# Patient Record
Sex: Male | Born: 1960 | Race: White | Hispanic: No | Marital: Married | Smoking: Never smoker
Health system: Southern US, Community
[De-identification: ages and names within clinical notes are randomized; demographics above are authoritative.]

## PROBLEM LIST (undated history)

## (undated) DIAGNOSIS — G4733 Obstructive sleep apnea (adult) (pediatric): Secondary | ICD-10-CM

## (undated) DIAGNOSIS — I10 Essential (primary) hypertension: Secondary | ICD-10-CM

## (undated) DIAGNOSIS — I4510 Unspecified right bundle-branch block: Secondary | ICD-10-CM

## (undated) HISTORY — PX: TONSILLECTOMY: SHX5217

## (undated) HISTORY — DX: Obstructive sleep apnea (adult) (pediatric): G47.33

## (undated) HISTORY — DX: Unspecified right bundle-branch block: I45.10

## (undated) HISTORY — DX: Essential (primary) hypertension: I10

## (undated) HISTORY — PX: OTHER SURGICAL HISTORY: SHX169

---

## 1986-09-25 HISTORY — PX: APPENDECTOMY: SHX54

## 1995-09-26 HISTORY — PX: VASECTOMY: SHX75

## 2002-11-28 ENCOUNTER — Encounter: Payer: Self-pay | Admitting: Internal Medicine

## 2005-02-24 ENCOUNTER — Ambulatory Visit: Payer: Self-pay | Admitting: Internal Medicine

## 2006-11-01 ENCOUNTER — Ambulatory Visit: Payer: Self-pay | Admitting: Internal Medicine

## 2009-09-13 ENCOUNTER — Ambulatory Visit: Payer: Self-pay | Admitting: Family Medicine

## 2009-09-13 DIAGNOSIS — M549 Dorsalgia, unspecified: Secondary | ICD-10-CM | POA: Insufficient documentation

## 2010-01-03 ENCOUNTER — Ambulatory Visit: Payer: Self-pay | Admitting: Internal Medicine

## 2010-01-03 DIAGNOSIS — I10 Essential (primary) hypertension: Secondary | ICD-10-CM | POA: Insufficient documentation

## 2010-01-03 DIAGNOSIS — R002 Palpitations: Secondary | ICD-10-CM

## 2010-01-11 ENCOUNTER — Encounter: Payer: Self-pay | Admitting: Internal Medicine

## 2010-01-11 ENCOUNTER — Ambulatory Visit: Payer: Self-pay

## 2010-02-14 ENCOUNTER — Ambulatory Visit: Payer: Self-pay | Admitting: Internal Medicine

## 2010-02-15 LAB — CONVERTED CEMR LAB
CO2: 29 meq/L (ref 19–32)
Calcium: 8.9 mg/dL (ref 8.4–10.5)
Creatinine, Ser: 1.1 mg/dL (ref 0.4–1.5)
Potassium: 4.2 meq/L (ref 3.5–5.1)
Sodium: 140 meq/L (ref 135–145)

## 2010-08-22 ENCOUNTER — Ambulatory Visit: Payer: Self-pay | Admitting: Internal Medicine

## 2010-08-22 DIAGNOSIS — G4733 Obstructive sleep apnea (adult) (pediatric): Secondary | ICD-10-CM | POA: Insufficient documentation

## 2010-09-07 ENCOUNTER — Ambulatory Visit: Payer: Self-pay | Admitting: Pulmonary Disease

## 2010-09-07 DIAGNOSIS — F518 Other sleep disorders not due to a substance or known physiological condition: Secondary | ICD-10-CM | POA: Insufficient documentation

## 2010-09-23 ENCOUNTER — Encounter: Payer: Self-pay | Admitting: Pulmonary Disease

## 2010-09-23 ENCOUNTER — Ambulatory Visit (HOSPITAL_BASED_OUTPATIENT_CLINIC_OR_DEPARTMENT_OTHER)
Admission: RE | Admit: 2010-09-23 | Discharge: 2010-09-23 | Payer: Self-pay | Source: Home / Self Care | Attending: Pulmonary Disease | Admitting: Pulmonary Disease

## 2010-10-23 LAB — CONVERTED CEMR LAB
AST: 26 units/L (ref 0–37)
Albumin: 4.4 g/dL (ref 3.5–5.2)
Alkaline Phosphatase: 88 units/L (ref 39–117)
BUN: 18 mg/dL (ref 6–23)
Basophils Absolute: 0 10*3/uL (ref 0.0–0.1)
Calcium: 9.2 mg/dL (ref 8.4–10.5)
Creatinine, Ser: 1.3 mg/dL (ref 0.4–1.5)
Glucose, Bld: 93 mg/dL (ref 70–99)
Hemoglobin: 14.4 g/dL (ref 13.0–17.0)
LDL Cholesterol: 123 mg/dL — ABNORMAL HIGH (ref 0–99)
Lymphocytes Relative: 37.3 % (ref 12.0–46.0)
Monocytes Relative: 7.4 % (ref 3.0–12.0)
Neutro Abs: 3.8 10*3/uL (ref 1.4–7.7)
RBC: 4.87 M/uL (ref 4.22–5.81)
RDW: 12.3 % (ref 11.5–14.6)
TSH: 2.59 microintl units/mL (ref 0.35–5.50)
Total CHOL/HDL Ratio: 5
Triglycerides: 145 mg/dL (ref 0.0–149.0)
WBC: 7.6 10*3/uL (ref 4.5–10.5)

## 2010-10-25 NOTE — Assessment & Plan Note (Signed)
Summary: FOLLOW UP / LFW   Vital Signs:  Patient profile:   50 year old male Weight:      232 pounds BMI:     29.10 Temp:     98.5 degrees F oral Pulse rate:   80 / minute Pulse rhythm:   regular BP sitting:   158 / 100  (left arm) Cuff size:   large  Vitals Entered By: Mervin Hack CMA Duncan Dull) (August 22, 2010 2:23 PM)  Serial Vital Signs/Assessments:  Time      Position  BP       Pulse  Resp  Temp     By           R Arm     146/100                        Cindee Salt MD  CC: 6 month follow-up   History of Present Illness: feels well in general  No headaches No chest pain No SOB No change in exercise tolerance No edema  wife is concerned about sleep apnea has pauses of as long as 25 seconds at night works 2 full jobs--considering cutting back some  Allergies: No Known Drug Allergies  Past History:  Past medical, surgical, family and social histories (including risk factors) reviewed for relevance to current acute and chronic problems.  Past Medical History: Reviewed history from 01/03/2010 and no changes required. RBBB Hypertension  Past Surgical History: Reviewed history from 09/13/2009 and no changes required. Appendectomy, 1988 Tonsillectomy Vasectomy, 1997 Fx L arm  Family History: Reviewed history from 01/03/2010 and no changes required. Dad had stroke @55   doing okay Mom had skin cancer Strokes in pat GF and GGF HTN on dad's side No DM or CAD No prostate or colon cancer  Social History: Reviewed history from 01/03/2010 and no changes required. 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 Never Smoked---rare cigarillo Alcohol use-yes [employer]  Review of Systems       weight is stable occ gets sense of swelling in fingers avoids salt  Physical Exam  General:  alert and normal appearance.   Neck:  supple, no masses, no thyromegaly,  no carotid bruits, and no cervical lymphadenopathy.   Lungs:  normal respiratory effort, no intercostal retractions, no accessory muscle use, and normal breath sounds.   Heart:  normal rate, regular rhythm, no murmur, and no gallop.   Extremities:  no edema Psych:  normally interactive, good eye contact, not anxious appearing, and not depressed appearing.     Impression & Recommendations:  Problem # 1:  HYPERTENSION (ICD-401.9) Assessment Deteriorated worse now could probably increase meds but would wait for sleep eval since if he has apnea, that could be causing some of his elevated BP  His updated medication list for this problem includes:    Bisoprolol-hydrochlorothiazide 2.5-6.25 Mg Tabs (Bisoprolol-hydrochlorothiazide) .Marland Kitchen... 1 tab daily for high blood pressure  BP today: 158/100 Prior BP: 130/80 (02/14/2010)  Labs Reviewed: K+: 4.2 (02/14/2010) Creat: : 1.1 (02/14/2010)   Chol: 189 (01/03/2010)   HDL: 37.40 (01/03/2010)   LDL: 123 (01/03/2010)   TG: 145.0 (01/03/2010)  Problem # 2:  SLEEP APNEA (ICD-780.57) Assessment: New  wife reports pauses of as much as 25 seconds he does have some daytime somnolence but also works 2 full time jobs so almost certainly isn't sleeping enough  P: will refer for sleep eval  Orders: Sleep Disorder Referral (Sleep Disorder)  Complete Medication List: 1)  Bisoprolol-hydrochlorothiazide 2.5-6.25 Mg Tabs (Bisoprolol-hydrochlorothiazide) .Marland Kitchen.. 1 tab daily for high blood pressure  Patient Instructions: 1)  Please schedule a follow-up appointment in 6 months for physical 2)  Referral Appointment Information 3)  Day/Date: 4)  Time: 5)  Place/MD: 6)  Address: 7)  Phone/Fax: 8)  Patient given appointment information. Information/Orders faxed/mailed.   Orders Added: 1)  Est. Patient Level III [16109] 2)  Sleep Disorder Referral [Sleep Disorder]    Current Allergies (reviewed today): No known allergies

## 2010-10-25 NOTE — Assessment & Plan Note (Signed)
Summary: 6WK FOLLOW UP / LFW   Vital Signs:  Patient profile:   50 year old male Weight:      234 pounds Temp:     98.5 degrees F oral Pulse rate:   72 / minute Pulse rhythm:   regular BP sitting:   130 / 80  (left arm) Cuff size:   large  Vitals Entered By: Mervin Hack CMA Duncan Dull) (Feb 14, 2010 2:39 PM) CC: 6 week follow-up   History of Present Illness: doing fine with the medicine No dizziness No sexual problems no stomach trouble  No trouble emptying bladder  Allergies: No Known Drug Allergies  Past History:  Past medical, surgical, family and social histories (including risk factors) reviewed for relevance to current acute and chronic problems.  Past Medical History: Reviewed history from 01/03/2010 and no changes required. RBBB Hypertension  Past Surgical History: Reviewed history from 09/13/2009 and no changes required. Appendectomy, 1988 Tonsillectomy Vasectomy, 1997 Fx L arm  Family History: Reviewed history from 01/03/2010 and no changes required. Dad had stroke @55   doing okay Mom had skin cancer Strokes in pat GF and GGF HTN on dad's side No DM or CAD No prostate or colon cancer  Social History: Reviewed history from 01/03/2010 and no changes required. 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 Never Smoked---rare cigarillo Alcohol use-yes [employer]  Review of Systems       appetite is okay sleeping well weight is about the same  Physical Exam  General:  alert and normal appearance.   Neck:  supple, no masses, no thyromegaly, no carotid bruits, and no cervical lymphadenopathy.   Lungs:  normal respiratory effort and normal breath sounds.   Heart:  normal rate, regular rhythm, no murmur, and no gallop.   Extremities:  no edema   Impression & Recommendations:  Problem # 1:  HYPERTENSION (ICD-401.9) Assessment Improved  had early LVH on  echo and slight proteinuria doing fine on med BP better  P: continue    check renal  His updated medication list for this problem includes:    Bisoprolol-hydrochlorothiazide 2.5-6.25 Mg Tabs (Bisoprolol-hydrochlorothiazide) .Marland Kitchen... 1 tab daily for high blood pressure  BP today: 130/80 Prior BP: 160/100 (01/03/2010)  Labs Reviewed: K+: 4.5 (01/03/2010) Creat: : 1.3 (01/03/2010)   Chol: 189 (01/03/2010)   HDL: 37.40 (01/03/2010)   LDL: 123 (01/03/2010)   TG: 145.0 (01/03/2010)  Orders: Venipuncture (64403) TLB-Renal Function Panel (80069-RENAL)  Complete Medication List: 1)  Bisoprolol-hydrochlorothiazide 2.5-6.25 Mg Tabs (Bisoprolol-hydrochlorothiazide) .Marland Kitchen.. 1 tab daily for high blood pressure  Patient Instructions: 1)  Please schedule a follow-up appointment in 6 months .   Current Allergies (reviewed today): No known allergies

## 2010-10-25 NOTE — Assessment & Plan Note (Signed)
Summary: CPX / LFW   Vital Signs:  Patient profile:   50 year old male Weight:      232 pounds Temp:     98.4 degrees F oral Pulse rate:   86 / minute Pulse rhythm:   regular BP sitting:   160 / 100  (left arm) Cuff size:   large  Vitals Entered By: Mervin Hack CMA Duncan Dull) (January 03, 2010 2:48 PM)  Serial Vital Signs/Assessments:  Time      Position  BP       Pulse  Resp  Temp     By           R Arm     138/98                         Cindee Salt MD  CC: adult physical   History of Present Illness: here for physical  no new concerns  Worries about BP doesn't check it though  Preventive Screening-Counseling & Management  Alcohol-Tobacco     Smoking Status: never  Allergies: No Known Drug Allergies  Past History:  Past Medical History: RBBB Hypertension  Past Surgical History: Reviewed history from 09/13/2009 and no changes required. 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 Never Smoked---rare cigarillo Alcohol use-yes [employer] Smoking Status:  never  Review of Systems General:  weight stable no sig exercise since working 2 jobs sleeps okay wears seat belt. Eyes:  Denies double vision and vision loss-1 eye; reading glasses now. ENT:  Complains of ringing in ears; denies decreased hearing; constant low level noise teeth okay--due for dentist. CV:  Complains of lightheadness and palpitations; denies chest pain or discomfort, difficulty breathing at night, difficulty breathing while lying down, fainting, and shortness of breath with exertion; occ racing heart relates dizziness to stress. Resp:  Denies cough and shortness of  breath. GI:  Complains of indigestion; denies abdominal pain, bloody stools, change in bowel habits, dark tarry stools, nausea, and vomiting; occ heartburn---tums as needed (fairly rare). GU:  Complains of nocturia, urinary frequency, and urinary hesitancy; denies erectile dysfunction; slight slow stream at first Nocturia x 1 in general. MS:  Complains of joint pain; denies joint swelling; mild hand and knee pain takes ?glucosamine. Derm:  Denies lesion(s) and rash. Neuro:  Denies headaches, numbness, tingling, and weakness. Psych:  Complains of anxiety; denies depression; "Ill" in the past--better lately Doesn't let stuff bother him as much. Heme:  Denies abnormal bruising and enlarge lymph nodes. Allergy:  Denies seasonal allergies and sneezing.  Physical Exam  General:  alert and normal appearance.   Eyes:  pupils equal, pupils round, pupils reactive to light, and no optic disk abnormalities.   Ears:  R ear normal and L ear normal.   Mouth:  no erythema, no exudates, and no lesions.   Neck:  supple, no masses, no thyromegaly, no carotid bruits, and no cervical lymphadenopathy.   Lungs:  normal respiratory effort and normal breath sounds.   Heart:  normal rate, regular rhythm, no murmur, and no gallop.   Abdomen:  soft, non-tender, no masses, no hepatomegaly, and no splenomegaly.   Msk:  no joint tenderness and no joint swelling.   Pulses:  2+ in feet Extremities:  no edema Neurologic:  alert & oriented X3, strength normal in all extremities, and gait normal.   Skin:  no rashes and no suspicious lesions.   Axillary Nodes:  No palpable lymphadenopathy Psych:  normally interactive, good eye contact, not anxious appearing, and not depressed appearing.     Impression & Recommendations:  Problem # 1:  PREVENTIVE HEALTH CARE (ICD-V70.0) Assessment Comment Only discussed fitness May want to moderate his work schedule--go back to only 1 job (soon) cancer screening at age 85 Tdap  given  Problem # 2:  HYPERTENSION (ICD-401.9) Assessment: Comment Only  given new RBBB and elevated BP and strong family history of stroke, will start Rx --though at very low dose  BP today: 160/100 Prior BP: 150/100 (09/13/2009)  His updated medication list for this problem includes:    Bisoprolol-hydrochlorothiazide 2.5-6.25 Mg Tabs (Bisoprolol-hydrochlorothiazide) .Marland Kitchen... 1 tab daily for high blood pressure  Orders: TLB-Renal Function Panel (80069-RENAL) TLB-Lipid Panel (80061-LIPID) TLB-CBC Platelet - w/Differential (85025-CBCD) TLB-Hepatic/Liver Function Pnl (80076-HEPATIC) TLB-TSH (Thyroid Stimulating Hormone) (84443-TSH) Venipuncture (16109) TLB-Microalbumin/Creat Ratio, Urine (82043-MALB)  Problem # 3:  PALPITATIONS (ICD-785.1) Assessment: Comment Only  given new RBBB, will set up echo and check labs  His updated medication list for this problem includes:    Bisoprolol-hydrochlorothiazide 2.5-6.25 Mg Tabs (Bisoprolol-hydrochlorothiazide) .Marland Kitchen... 1 tab daily for high blood pressure  Orders: Echo Referral (Echo)  Complete Medication List: 1)  Bisoprolol-hydrochlorothiazide 2.5-6.25 Mg Tabs (Bisoprolol-hydrochlorothiazide) .Marland Kitchen.. 1 tab daily for high blood pressure  Other Orders: Tdap => 48yrs IM (60454) Admin 1st Vaccine (09811) Admin 1st Vaccine [facility]) [phone])  Patient Instructions: 1)  Please schedule a follow-up appointment in 6 weeks.  2)  Please set up echocardiogram Prescriptions: BISOPROLOL-HYDROCHLOROTHIAZIDE 2.5-6.25 MG TABS (BISOPROLOL-HYDROCHLOROTHIAZIDE) 1 tab daily for high blood pressure  #30 x 11   Entered and Authorized by:   Cindee Salt MD   Signed by:   Cindee Salt MD on 01/03/2010   Method used:   Electronically to        [facility] (retail)       [address]       [place], [place]  [address]       Ph: [phone]       Fax: [phone]   RxID:   [id number]   Prior Medications: Current  Allergies (reviewed today): No known allergies    Tetanus/Td Vaccine    Vaccine Type: Tdap    Site: left deltoid    Mfr: GlaxoSmithKline    Dose: 0.5 ml    Route: IM    Given by: Mervin Hack CMA (AAMA)    Exp. Date: 12/18/2011    Lot #: [id number]    VIS given: 08/13/07 version given January 03, 2010.   EKG  Procedure date:  01/03/2010  Findings:      sinus @ 74 New RBBB since last EKG in 2004

## 2010-10-27 NOTE — Assessment & Plan Note (Signed)
Summary: sleep apena/cb   Visit Type:  Initial Consult Copy to:  Dr. Alphonsus Sias Primary Provider/Referring Provider:  Dr. Tillman Abide  CC:  Sleep consult.Marland KitchenMarland KitchenEpworth score is 12.Marland Kitchen  History of Present Illness: 50 yo male for sleep evaluation.  His wife has noticed that he snores, and this is getting worse.  This has been present for years.  He will also stop breathing, and this can last upto 40 seconds.  He will then wake up with a gasp.  He has an erratic sleep schedule as result of his work schedule.  On the days he works he only sleeps 2 to 3 hours.  On the days he is off he will sleep 8 to 9 hours.  He does not use anything to help him sleep or stay awake.  He does talk in his sleep.  He denies sleep walking, nightmares, or bruxism.  There is no history of sleep hallucinations, sleep paralysis, or cataplexy.  His wife has noticed that his heart rate increases when he is asleep.  He denies restless legs.  There is no history of thyroid disease or depression.  His weight has been steady.  He had his tonsills removed as a child.  He has a history of hypertension, and is on two medications for this.   Preventive Screening-Counseling & Management  Alcohol-Tobacco     Alcohol drinks/day: <1     Smoking Status: never  Current Medications (verified): 1)  Bisoprolol-Hydrochlorothiazide 2.5-6.25 Mg Tabs (Bisoprolol-Hydrochlorothiazide) .Marland Kitchen.. 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 Never Smoked---rare cigarillo Alcohol use-yes [employer]  Social History: Alcohol drinks/day:  <1  Vital Signs:  Patient profile:   50 year old male Height:      73 inches (185.42 cm) Weight:      234.13 pounds (106.42 kg) BMI:     31.00 O2 Sat:      95 % on Room air Temp:     98.3 degrees F (36.83 degrees C) oral Pulse rate:   62 / minute BP sitting:   136 / 84  (left arm) Cuff size:   large  Vitals Entered By: Michel Bickers CMA (September 07, 2010 9:14 AM)  O2 Sat at Rest %:  95 O2 Flow:  Room air CC: Sleep consult.Marland KitchenMarland KitchenEpworth score is 12. Is Patient Diabetic? No Comments Medications reviewed with patient Michel Bickers [facility]  September 07, 2010 9:15 AM   Physical Exam  General:  normal appearance and healthy appearing.   Eyes:  PERRLA and EOMI.   Nose:  no deformity, discharge, inflammation, or lesions Mouth:  MP 4, enlarged tongue, retrognathic Neck:  no JVD.   Lungs:  clear bilaterally to auscultation and percussion Heart:  regular rate and rhythm, S1, S2 without murmurs, rubs, gallops, or clicks Abdomen:  bowel sounds positive; abdomen soft and non-tender without masses, or organomegaly Msk:  no deformity or scoliosis noted with normal posture Pulses:  pulses normal Extremities:  no clubbing, cyanosis, edema, or  deformity noted Neurologic:  normal CN II-XII and strength normal.   Cervical Nodes:  no significant adenopathy Psych:  alert and cooperative; normal mood and affect; normal attention span and concentration   Impression & Recommendations:  Problem # 1:  SLEEP APNEA (ICD-780.57) He has sleep disruption, snoring, and witnessed apnea.  He has refractory hypertension.  I am concerned he has sleep apnea.  To further assess this I will schedule him for a sleep test.  I explained what sleep apnea is, and how it can affect his health.  Driving precautions were  reviewed.  Discussed the importance of maintaining a reasonable weight.    Depending on the severity of his sleep disordered breathing he may be a good candidate for an oral appliance.  Problem # 2:  INADEQUATE SLEEP HYGIENE (ICD-307.49) Discussed the importance of maintaining a regular sleep wake schedule.  Complete Medication List: 1)  Bisoprolol-hydrochlorothiazide 2.5-6.25 Mg Tabs (Bisoprolol-hydrochlorothiazide) .Marland Kitchen.. 1 tab daily for high blood pressure  Other Orders: Consultation Level IV (04540) Sleep Disorder Referral (Sleep Disorder)  Patient Instructions: 1)  Will schedule sleep test 2)  Will call to schedule follow up after sleep test reviewed

## 2010-10-27 NOTE — Miscellaneous (Signed)
Summary: Sleep study report  Clinical Lists Changes AHI 22.6, SpO2 low 82%.  Significant REM and positional effect.  CPAP 10 cm H2O with AHI down to 0.  Observed in REM and supine sleep.  Will have my nurse call to schedule ROV to review results.  Appended Document: Sleep study report pt could not come in until 2/8 at 4:30 due to work schedule. pt is aware of apt

## 2010-11-02 ENCOUNTER — Encounter: Payer: Self-pay | Admitting: Pulmonary Disease

## 2010-11-02 ENCOUNTER — Ambulatory Visit (INDEPENDENT_AMBULATORY_CARE_PROVIDER_SITE_OTHER): Payer: BC Managed Care – PPO | Admitting: Pulmonary Disease

## 2010-11-02 DIAGNOSIS — G4733 Obstructive sleep apnea (adult) (pediatric): Secondary | ICD-10-CM

## 2010-11-10 NOTE — Assessment & Plan Note (Signed)
Summary: discuss sleep study//ms   Copy to:  Dr. Alphonsus Sias Primary Provider/Referring Provider:  Dr. Tillman Abide  CC:  pt here to discuss sleep study results.  History of Present Illness: 50 yo male with OSA.  He had his sleep test on Sep 23, 2010:  AHI 22.6, SpO2 low 82%.  Significant REM and positional effect.  He used CPAP 10 cm H2O with AHI down to 0.  Observed in REM and supine sleep.  He continues to have sleep problems as before.  He felt like using CPAP during the test helped his sleep.    Current Medications (verified): 1)  Bisoprolol-Hydrochlorothiazide 2.5-6.25 Mg Tabs (Bisoprolol-Hydrochlorothiazide) .Marland Kitchen.. 1 Tab Daily For High Blood Pressure  Allergies (verified): No Known Drug Allergies  Past History:  Past Medical History: RBBB Hypertension OSA      - PSG 09/23/10 AHI 22  Past Surgical History: Reviewed history from 09/13/2009 and no changes required. 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---rare cigarillo Alcohol use-yes [employer] baseball  Vital Signs:  Patient profile:   50 year old male Height:      73 inches Weight:      234.50 pounds BMI:     31.05 O2 Sat:      97 % on Room air Temp:     98.2 degrees F oral Pulse rate:   71 / minute BP sitting:   144 / 88  (left arm) Cuff size:   large  Vitals Entered By: Carver Fila (November 02, 2010 4:32 PM)  O2 Flow:  Room air CC: pt here to discuss sleep study results Comments meds and allergies updated Phone number updated Carver Fila  November 02, 2010 4:32 PM    Physical Exam  General:  normal appearance and healthy appearing.   Nose:  no deformity, discharge, inflammation, or lesions Mouth:  MP 4, enlarged tongue, retrognathic Neck:  no JVD.   Lungs:  clear bilaterally to auscultation and percussion Heart:   regular rate and rhythm, S1, S2 without murmurs, rubs, gallops, or clicks Extremities:  no clubbing, cyanosis, edema, or deformity noted Neurologic:  normal CN II-XII and strength normal.   Cervical Nodes:  no significant adenopathy Psych:  alert and cooperative; normal mood and affect; normal attention span and concentration   Impression & Recommendations:  Problem # 1:  OBSTRUCTIVE SLEEP APNEA (ICD-327.23) He has moderate sleep apnea.  I reviewed his sleep test results.  Explained how sleep apnea can affect his health particularly with his blood pressure.  Reviewed treatment options.  He would be a good candidate for either CPAP or mandibular advancement device.  He would like to discuss options with his wife first, and then decide about additional therapy for his sleep apnea.  He will call once he has decided.  If he chooses CPAP, will then set up CPAP 10 cm H2O.  If he chooses oral appliance, will then set up dental evaluation.  Explained to him that he would need f/u testing if he opts for an oral appliance.  Complete Medication List: 1)  Bisoprolol-hydrochlorothiazide 2.5-6.25 Mg Tabs (Bisoprolol-hydrochlorothiazide) .Marland Kitchen.. 1 tab daily for high blood pressure  Other Orders: Est. Patient Level III (56433)  Patient Instructions: 1)  Call once you have decided what therapy you want for sleep apnea 2)  Follow up in 2 months

## 2010-12-16 ENCOUNTER — Other Ambulatory Visit: Payer: Self-pay | Admitting: *Deleted

## 2010-12-16 MED ORDER — BISOPROLOL-HYDROCHLOROTHIAZIDE 2.5-6.25 MG PO TABS: 1.0000 | ORAL_TABLET | Freq: Every day | ORAL | Status: DC

## 2011-10-09 ENCOUNTER — Ambulatory Visit (INDEPENDENT_AMBULATORY_CARE_PROVIDER_SITE_OTHER): Payer: BC Managed Care – PPO | Admitting: Internal Medicine

## 2011-10-09 ENCOUNTER — Encounter: Payer: Self-pay | Admitting: Internal Medicine

## 2011-10-09 VITALS — BP 130/80 | HR 76 | Temp 98.0°F | Ht 73.0 in | Wt 240.0 lb

## 2011-10-09 DIAGNOSIS — I10 Essential (primary) hypertension: Secondary | ICD-10-CM

## 2011-10-09 DIAGNOSIS — N529 Male erectile dysfunction, unspecified: Secondary | ICD-10-CM

## 2011-10-09 MED ORDER — BISOPROLOL-HYDROCHLOROTHIAZIDE 2.5-6.25 MG PO TABS: 1.0000 | ORAL_TABLET | Freq: Every day | ORAL | Status: DC

## 2011-10-09 MED ORDER — VARDENAFIL HCL 20 MG PO TABS: 20.0000 mg | ORAL_TABLET | Freq: Every day | ORAL | Status: AC | PRN

## 2011-10-09 NOTE — Progress Notes (Signed)
  Subjective:    Patient ID: Alex [name], male    DOB: 12/07/1960, 51 y.o.   MRN: [id number]  HPI Has been evaluated for sleep apnea Expects to get the machine soon Sleeps in day  Having ongoing problems with ED Ready to try meds Trouble with getting erections and doesn't stay hard to penetrate at times  No urinary problems other than delay in initiation at times Does have nocturia x 2-3 when he sleeps in daytime Usually doesn't have nocturia if he sleeps normally at night  Has had mild cold Not sick  Current Outpatient Prescriptions on File Prior to Visit  Medication Sig Dispense Refill  . bisoprolol-hydrochlorothiazide (ZIAC) 2.5-6.25 MG per tablet Take 1 tablet by mouth daily.  30 tablet  7    No Known Allergies  Past Medical History  Diagnosis Date  . RBBB (right bundle branch block)   . HTN (hypertension)   . OSA (obstructive sleep apnea)     PSG 09/23/10 AHI 22    Past Surgical History  Procedure Date  . Appendectomy 1988  . Tonsillectomy   . Vasectomy 1997   . Fracture l arm     Family History  Problem Relation Age of Onset  . Stroke Father 74    doing okay  . Skin cancer Mother   . Stroke Paternal Grandfather     and PGGF  . Hypertension      dad's side   . Diabetes Neg Hx     DM   . Coronary artery disease Neg Hx   . Prostate cancer Neg Hx     no colon cancer     History   Social History  . Marital Status: Married    Spouse Name: N/A    Number of Children: 2  . Years of Education: N/A   Occupational History  . correctioons officer    Social History Main Topics  . Smoking status: Never Smoker   . Smokeless tobacco: Current User    Types: Chew   Comment: rare cigarillo   . Alcohol Use: Yes  . Drug Use: No  . Sexually Active: Not on file   Other Topics Concern  . 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     Review of  Systems Has cut back slightly on his hours Less stress at both of them     Objective:   Physical Exam        Assessment & Plan:

## 2011-10-09 NOTE — Assessment & Plan Note (Signed)
Fairly classic history Seems reasonable to use trial of med Spent over half of 15 minute visit coordinating care  Sample for viagra 100 given Rx for levitra (since less money) Try cialis if these don't work

## 2011-10-09 NOTE — Assessment & Plan Note (Signed)
BP Readings from Last 3 Encounters:  10/09/11 130/80  11/02/10 144/88  09/07/10 136/84   Good control Will check labs No change needed

## 2011-10-10 LAB — CBC WITH DIFFERENTIAL/PLATELET
Basophils Absolute: 0 10*3/uL (ref 0.0–0.1)
Eosinophils Absolute: 0.3 10*3/uL (ref 0.0–0.7)
HCT: 44.1 % (ref 39.0–52.0)
Hemoglobin: 15.2 g/dL (ref 13.0–17.0)
Lymphs Abs: 2.1 10*3/uL (ref 0.7–4.0)
MCHC: 34.4 g/dL (ref 30.0–36.0)
MCV: 87.9 fl (ref 78.0–100.0)
Neutro Abs: 3.9 10*3/uL (ref 1.4–7.7)
RDW: 12.3 % (ref 11.5–14.6)

## 2011-10-10 LAB — BASIC METABOLIC PANEL
CO2: 29 mEq/L (ref 19–32)
Glucose, Bld: 122 mg/dL — ABNORMAL HIGH (ref 70–99)
Potassium: 3.8 mEq/L (ref 3.5–5.1)
Sodium: 141 mEq/L (ref 135–145)

## 2011-10-10 LAB — HEPATIC FUNCTION PANEL
Alkaline Phosphatase: 88 U/L (ref 39–117)
Bilirubin, Direct: 0.1 mg/dL (ref 0.0–0.3)
Total Bilirubin: 1.3 mg/dL — ABNORMAL HIGH (ref 0.3–1.2)
Total Protein: 7.2 g/dL (ref 6.0–8.3)

## 2012-02-29 ENCOUNTER — Ambulatory Visit: Payer: BC Managed Care – PPO | Admitting: Internal Medicine

## 2012-03-06 ENCOUNTER — Ambulatory Visit (INDEPENDENT_AMBULATORY_CARE_PROVIDER_SITE_OTHER): Payer: BC Managed Care – PPO | Admitting: Internal Medicine

## 2012-03-06 ENCOUNTER — Encounter: Payer: Self-pay | Admitting: Internal Medicine

## 2012-03-06 VITALS — BP 112/80 | HR 56 | Temp 98.6°F | Wt 233.0 lb

## 2012-03-06 DIAGNOSIS — D1779 Benign lipomatous neoplasm of other sites: Secondary | ICD-10-CM

## 2012-03-06 DIAGNOSIS — D1720 Benign lipomatous neoplasm of skin and subcutaneous tissue of unspecified limb: Secondary | ICD-10-CM | POA: Insufficient documentation

## 2012-03-06 MED ORDER — TADALAFIL 20 MG PO TABS: 20.0000 mg | ORAL_TABLET | Freq: Every day | ORAL | Status: DC | PRN

## 2012-03-06 NOTE — Progress Notes (Signed)
  Subjective:    Patient ID: Alex [name], male    DOB: [birth year], 51 y.o.   MRN: [id number]  HPI Has spot on right bicep for 3-4 years Some pain 2-3 weeks ago--better now Doesn't remember any injury  Is right handed No arm weakness May have increased in size some over the years  Current Outpatient Prescriptions on File Prior to Visit  Medication Sig Dispense Refill  . bisoprolol-hydrochlorothiazide (ZIAC) 2.5-6.25 MG per tablet Take 1 tablet by mouth daily.  30 tablet  11    No Known Allergies  Past Medical History  Diagnosis Date  . RBBB (right bundle branch block)   . HTN (hypertension)   . OSA (obstructive sleep apnea)     PSG 09/23/10 AHI 22    Past Surgical History  Procedure Date  . Appendectomy 1988  . Tonsillectomy   . Vasectomy 1997   . Fracture l arm     Family History  Problem Relation Age of Onset  . Stroke Father 72    doing okay  . Skin cancer Mother   . Stroke Paternal Grandfather     and PGGF  . Hypertension      dad's side   . Diabetes Neg Hx     DM   . Coronary artery disease Neg Hx   . Prostate cancer Neg Hx     no colon cancer     History   Social History  . Marital Status: Married    Spouse Name: N/A    Number of Children: 2  . Years of Education: N/A   Occupational History  . [employer] officer    Social History Main Topics  . Smoking status: Never Smoker   . Smokeless tobacco: Current User    Types: Chew   Comment: rare cigarillo   . Alcohol Use: Yes  . Drug Use: No  . Sexually Active: Not on file   Other Topics Concern  . 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     Review of Systems Did use the ED meds Liked cialis the most    Objective:   Physical Exam  Constitutional: He appears well-developed and well-nourished. No distress.  Musculoskeletal:       Spongy mass over body of biceps Not hard or  tender          Assessment & Plan:

## 2012-03-06 NOTE — Assessment & Plan Note (Signed)
I considered the possibility of partially torn biceps tendon but it doesn't seem to be that Just lipoma Discussed benign nature No Rx unless if causes ongoing symptoms

## 2012-03-06 NOTE — Patient Instructions (Addendum)
Please call when you can set up physical ---which is due in January or February

## 2012-07-19 ENCOUNTER — Telehealth: Payer: Self-pay | Admitting: Internal Medicine

## 2012-07-19 NOTE — Telephone Encounter (Signed)
Caller: Dyke/Patient; Patient Name: Alex [name]; PCP: [facility]); Best Callback Phone Number: [phone].  Call regarding Sick Note needed to go back to work on 10-28.  PT has Diarrhea w/ Abdominal cramping and generalized weakness, onset 10-25.  All emergent symptoms ruled out per Diarrhea Protocol, home care advice.  Discussed hydration and to call back if symptoms worsen or last longer than 3 days.  Last office visit was 03-06-12.  PT WOULD LIKE TO PICK UP WORK NOTE ON 10-28.

## 2012-07-19 NOTE — Telephone Encounter (Signed)
I really can't write an out of work note, or return to work note without seeing someone If he has to have a note, I will need to evaluate him

## 2012-07-19 NOTE — Telephone Encounter (Signed)
Spoke with patient and advised results   

## 2012-07-22 ENCOUNTER — Ambulatory Visit: Payer: BC Managed Care – PPO | Admitting: Internal Medicine

## 2012-10-01 ENCOUNTER — Other Ambulatory Visit: Payer: Self-pay | Admitting: *Deleted

## 2012-10-01 MED ORDER — BISOPROLOL-HYDROCHLOROTHIAZIDE 2.5-6.25 MG PO TABS: 1.0000 | ORAL_TABLET | Freq: Every day | ORAL | Status: DC

## 2012-12-09 ENCOUNTER — Other Ambulatory Visit: Payer: Self-pay | Admitting: *Deleted

## 2012-12-09 MED ORDER — TADALAFIL 20 MG PO TABS: 20.0000 mg | ORAL_TABLET | Freq: Every day | ORAL | Status: DC | PRN

## 2013-05-01 ENCOUNTER — Other Ambulatory Visit: Payer: Self-pay | Admitting: *Deleted

## 2013-05-01 MED ORDER — BISOPROLOL-HYDROCHLOROTHIAZIDE 2.5-6.25 MG PO TABS: 1.0000 | ORAL_TABLET | Freq: Every day | ORAL | Status: DC

## 2013-07-02 ENCOUNTER — Encounter: Payer: Self-pay | Admitting: Internal Medicine

## 2013-07-02 ENCOUNTER — Ambulatory Visit (INDEPENDENT_AMBULATORY_CARE_PROVIDER_SITE_OTHER): Payer: BC Managed Care – PPO | Admitting: Internal Medicine

## 2013-07-02 VITALS — BP 148/94 | HR 75 | Temp 98.3°F | Wt 227.0 lb

## 2013-07-02 DIAGNOSIS — D1720 Benign lipomatous neoplasm of skin and subcutaneous tissue of unspecified limb: Secondary | ICD-10-CM

## 2013-07-02 DIAGNOSIS — I10 Essential (primary) hypertension: Secondary | ICD-10-CM

## 2013-07-02 DIAGNOSIS — G4733 Obstructive sleep apnea (adult) (pediatric): Secondary | ICD-10-CM

## 2013-07-02 DIAGNOSIS — D1779 Benign lipomatous neoplasm of other sites: Secondary | ICD-10-CM

## 2013-07-02 DIAGNOSIS — F39 Unspecified mood [affective] disorder: Secondary | ICD-10-CM | POA: Insufficient documentation

## 2013-07-02 MED ORDER — BISOPROLOL-HYDROCHLOROTHIAZIDE 2.5-6.25 MG PO TABS: 1.0000 | ORAL_TABLET | Freq: Every day | ORAL | Status: DC

## 2013-07-02 NOTE — Progress Notes (Signed)
  Subjective:    Patient ID: Alex [name], male    DOB: 1961/03/02, 52 y.o.   MRN: [id number]  HPI Here for follow up  Got some extra BP pills---hasn't been in for a while Only missed dose today No headaches No chest pain No SOB Mild swelling in fingers at times Doesn't check BP  Wife is concerned about his mood Gets angry and anxious at times No yelling or physical aggression Keeps his anger in him Feels it is some better ---he has prayed on it  Ongoing sleep issues Reviewed sleep study He is ready to try CPAP  Still has lipoma on arm Only aches a little if he has thrown hard playing ball  No current outpatient prescriptions on file prior to visit.   No current facility-administered medications on file prior to visit.    No Known Allergies  Past Medical History  Diagnosis Date  . RBBB (right bundle branch block)   . HTN (hypertension)   . OSA (obstructive sleep apnea)     PSG 09/23/10 AHI 22    Past Surgical History  Procedure Laterality Date  . Appendectomy  1988  . Tonsillectomy    . Vasectomy  1997   . Fracture l arm      Family History  Problem Relation Age of Onset  . Stroke Father 27    doing okay  . Skin cancer Mother   . Stroke Paternal Grandfather     and PGGF  . Hypertension      dad's side   . Diabetes Neg Hx     DM   . Coronary artery disease Neg Hx   . Prostate cancer Neg Hx     no colon cancer       Review of Systems Appetite is okay Weight is down 6#     Objective:   Physical Exam  Constitutional: He appears well-developed and well-nourished. No distress.  Neck: Normal range of motion. Neck supple. No thyromegaly present.  Cardiovascular: Normal rate, regular rhythm, normal heart sounds and intact distal pulses.  Exam reveals no gallop.   No murmur heard. Pulmonary/Chest: Effort normal and breath sounds normal. No respiratory distress. He has no wheezes. He has no rales.  Musculoskeletal: He exhibits no edema.   Lipoma ~6cm diameter along right biceps still  Lymphadenopathy:    He has no cervical adenopathy.  Psychiatric: He has a normal mood and affect. His behavior is normal.          Assessment & Plan:

## 2013-07-02 NOTE — Assessment & Plan Note (Signed)
Wife wonders about meds for repressed anger Discussed sleep issues, 2 jobs He will try CPAP and may be giving up 2nd job No meds for now

## 2013-07-02 NOTE — Assessment & Plan Note (Signed)
AHI of 22 with desats Will order the CPAP

## 2013-07-02 NOTE — Assessment & Plan Note (Signed)
Rare symptoms No action needed

## 2013-07-02 NOTE — Assessment & Plan Note (Signed)
BP Readings from Last 3 Encounters:  07/02/13 148/94  03/06/12 112/80  10/09/11 130/80   Missed med so probably adequately controlled Will restart

## 2013-08-20 ENCOUNTER — Encounter: Payer: Self-pay | Admitting: Family Medicine

## 2013-08-20 ENCOUNTER — Ambulatory Visit (INDEPENDENT_AMBULATORY_CARE_PROVIDER_SITE_OTHER): Payer: BC Managed Care – PPO | Admitting: Family Medicine

## 2013-08-20 VITALS — BP 142/92 | HR 70 | Temp 98.2°F | Wt 235.2 lb

## 2013-08-20 DIAGNOSIS — J019 Acute sinusitis, unspecified: Secondary | ICD-10-CM | POA: Insufficient documentation

## 2013-08-20 MED ORDER — AMOXICILLIN-POT CLAVULANATE 875-125 MG PO TABS: 1.0000 | ORAL_TABLET | Freq: Two times a day (BID) | ORAL | Status: AC

## 2013-08-20 NOTE — Progress Notes (Signed)
Pre-visit discussion using our clinic review tool. No additional management support is needed unless otherwise documented below in the visit note.  

## 2013-08-20 NOTE — Patient Instructions (Signed)
You have a sinus infection. Push fluids and plenty of rest. Nasal saline irrigation or neti pot to help drain sinuses. May use plain mucinex or immediate release guaifenesin with plenty of fluid to help mobilize mucous. Take medicine as prescribed: augmentin prescription to hold on to in case symptoms past 10 days, or any sudden worsening. Let us know if fever >101.5, trouble opening/closing mouth, difficulty swallowing, or worsening - you may need to be seen again.

## 2013-08-20 NOTE — Assessment & Plan Note (Signed)
Anticipate viral sinusitis given short duration.  Discussed this along with supportive care. Provided with augmentin script for prolonged or deteriorated sxs.

## 2013-08-20 NOTE — Progress Notes (Signed)
  Subjective:    Patient ID: Alex [name], male    DOB: 12-Oct-1960, 52 y.o.   MRN: [id number]  HPI CC: sinus congestion  6d h/o sinus congestion, chest congestion and rattly cough.  Started with ST, then progressed to productive cough.  some intermittent nausea.  + PNdrainage.  Head > chest congestion.  No fevers/chills, ear or tooth pain. So far has tried nothing for this OTC.  Wife with ST a few days ago No smokers at home. No h/o asthma.   OSA on CPAP.  Past Medical History  Diagnosis Date  . RBBB (right bundle branch block)   . HTN (hypertension)   . OSA (obstructive sleep apnea)     PSG 09/23/10 AHI 22     Review of Systems Per HPI    Objective:   Physical Exam  Nursing note and vitals reviewed. Constitutional: He appears well-developed and well-nourished. No distress.  HENT:  Head: Normocephalic and atraumatic.  Right Ear: Hearing, tympanic membrane, external ear and ear canal normal.  Left Ear: Hearing, tympanic membrane, external ear and ear canal normal.  Nose: Mucosal edema present. No rhinorrhea. Right sinus exhibits no maxillary sinus tenderness and no frontal sinus tenderness. Left sinus exhibits no maxillary sinus tenderness and no frontal sinus tenderness.  Mouth/Throat: Uvula is midline, oropharynx is clear and moist and mucous membranes are normal. No oropharyngeal exudate, posterior oropharyngeal edema, posterior oropharyngeal erythema or tonsillar abscesses.  Evidently congested  Eyes: Conjunctivae and EOM are normal. Pupils are equal, round, and reactive to light. No scleral icterus.  Neck: Normal range of motion. Neck supple.  Cardiovascular: Normal rate, regular rhythm, normal heart sounds and intact distal pulses.   No murmur heard. Pulmonary/Chest: Effort normal and breath sounds normal. No respiratory distress. He has no wheezes. He has no rales.  Lymphadenopathy:    He has no cervical adenopathy.  Skin: Skin is warm and dry. No rash noted.        Assessment & Plan:

## 2013-09-22 ENCOUNTER — Other Ambulatory Visit: Payer: Self-pay | Admitting: *Deleted

## 2013-09-22 MED ORDER — TADALAFIL 20 MG PO TABS: 20.0000 mg | ORAL_TABLET | Freq: Every day | ORAL | Status: DC | PRN

## 2014-01-12 ENCOUNTER — Encounter: Payer: Self-pay | Admitting: Internal Medicine

## 2014-01-12 ENCOUNTER — Ambulatory Visit (INDEPENDENT_AMBULATORY_CARE_PROVIDER_SITE_OTHER): Payer: BC Managed Care – PPO | Admitting: Internal Medicine

## 2014-01-12 VITALS — BP 110/80 | HR 77 | Temp 98.0°F | Ht 73.0 in | Wt 233.0 lb

## 2014-01-12 DIAGNOSIS — I10 Essential (primary) hypertension: Secondary | ICD-10-CM

## 2014-01-12 DIAGNOSIS — Z0000 Encounter for general adult medical examination without abnormal findings: Secondary | ICD-10-CM

## 2014-01-12 DIAGNOSIS — Z125 Encounter for screening for malignant neoplasm of prostate: Secondary | ICD-10-CM

## 2014-01-12 DIAGNOSIS — Z1211 Encounter for screening for malignant neoplasm of colon: Secondary | ICD-10-CM

## 2014-01-12 DIAGNOSIS — R7309 Other abnormal glucose: Secondary | ICD-10-CM

## 2014-01-12 NOTE — Assessment & Plan Note (Signed)
Better with less stress BP Readings from Last 3 Encounters:  01/12/14 110/80  08/20/13 142/92  07/02/13 148/94

## 2014-01-12 NOTE — Assessment & Plan Note (Addendum)
Fairly healthy Plans to restart exercise Will check PSA after discussion Colonoscopy Check A1c --sugar up last year and ate about 1 hour ago (hard to come back)

## 2014-01-12 NOTE — Progress Notes (Signed)
Pre visit review using our clinic review tool, if applicable. No additional management support is needed unless otherwise documented below in the visit note. 

## 2014-01-12 NOTE — Patient Instructions (Signed)
Please consider getting an appointment with a dermatologist---to keep an eye on your moles. I recommend Drs Kellie Moor and Land in [place].

## 2014-01-12 NOTE — Progress Notes (Signed)
Subjective:    Patient ID: Alex [name], male    DOB: 1960/12/19, 53 y.o.   MRN: [id number]  HPI Here for physical  Quit 2nd job at [employer] much less stress--feels better  Using the CPAP This is really successful--he feels rested in the morning with this (and giving up 2nd job)  Hopes to start exercising soon  Current Outpatient Prescriptions on File Prior to Visit  Medication Sig Dispense Refill  . bisoprolol-hydrochlorothiazide (ZIAC) 2.5-6.25 MG per tablet Take 1 tablet by mouth daily.  90 tablet  3  . tadalafil (CIALIS) 20 MG tablet Take 1 tablet (20 mg total) by mouth daily as needed for erectile dysfunction.  10 tablet  1   No current facility-administered medications on file prior to visit.    No Known Allergies  Past Medical History  Diagnosis Date  . RBBB (right bundle branch block)   . HTN (hypertension)   . OSA (obstructive sleep apnea)     PSG 09/23/10 AHI 22    Past Surgical History  Procedure Laterality Date  . Appendectomy  1988  . Tonsillectomy    . Vasectomy  1997   . Fracture l arm      Family History  Problem Relation Age of Onset  . Stroke Father 67    doing okay  . Skin cancer Mother   . Stroke Paternal Grandfather     and PGGF  . Hypertension      dad's side   . Diabetes Neg Hx     DM   . Coronary artery disease Neg Hx   . Prostate cancer Neg Hx     no colon cancer     Review of Systems  Constitutional: Negative for fatigue and unexpected weight change.       Wears seat belt  HENT: Positive for tinnitus. Negative for dental problem and hearing loss.        Overdue for dentist  Eyes: Negative for visual disturbance.       No diplopia or unilateral vision loss Has reading glasses  Respiratory: Negative for cough, chest tightness and shortness of breath.   Cardiovascular: Positive for palpitations. Negative for chest pain and leg swelling.       Rare palpitation  Gastrointestinal: Negative for nausea, vomiting,  abdominal pain, constipation and blood in stool.       No recent heartburn  Endocrine: Negative for cold intolerance and heat intolerance.  Genitourinary: Positive for difficulty urinating. Negative for urgency and frequency.       Slight slow stream Satisfied with ED med  Musculoskeletal: Positive for arthralgias. Negative for back pain and myalgias.       Occ right knee pain  Skin: Negative for rash.       Has spot on right upper back he wants checked  Allergic/Immunologic: Positive for environmental allergies. Negative for immunocompromised state.       Very mild symptoms--no meds  Neurological: Positive for light-headedness. Negative for dizziness, syncope, weakness, numbness and headaches.       Mild occasional orthostatic dizziness  Hematological: Negative for adenopathy. Bruises/bleeds easily.  Psychiatric/Behavioral: Negative for sleep disturbance and dysphoric mood. The patient is not nervous/anxious.        Objective:   Physical Exam  Constitutional: He is oriented to person, place, and time. He appears well-developed and well-nourished. No distress.  HENT:  Head: Normocephalic and atraumatic.  Right Ear: External ear normal.  Left Ear: External ear normal.  Mouth/Throat:  Oropharynx is clear and moist. No oropharyngeal exudate.  Eyes: Conjunctivae and EOM are normal. Pupils are equal, round, and reactive to light.  Neck: Normal range of motion. Neck supple. No thyromegaly present.  Cardiovascular: Normal rate, regular rhythm, normal heart sounds and intact distal pulses.  Exam reveals no gallop.   No murmur heard. Pulmonary/Chest: Effort normal and breath sounds normal. No respiratory distress. He has no wheezes. He has no rales.  Abdominal: Soft. He exhibits no distension. There is no tenderness. There is no rebound and no guarding.  Musculoskeletal: He exhibits no edema and no tenderness.  Lymphadenopathy:    He has no cervical adenopathy.  Neurological: He is alert  and oriented to person, place, and time.  Skin: No rash noted. No erythema.  Stable 35mm red plaque like area on posterior right shoulder (?morphea?) Many benign nevi  Psychiatric: He has a normal mood and affect. His behavior is normal.          Assessment & Plan:

## 2014-01-13 ENCOUNTER — Telehealth: Payer: Self-pay | Admitting: Internal Medicine

## 2014-01-13 LAB — CBC WITH DIFFERENTIAL/PLATELET
BASOS ABS: 0 10*3/uL (ref 0.0–0.1)
BASOS PCT: 0.2 % (ref 0.0–3.0)
Eosinophils Absolute: 0.4 10*3/uL (ref 0.0–0.7)
Eosinophils Relative: 5.2 % — ABNORMAL HIGH (ref 0.0–5.0)
HCT: 45.1 % (ref 39.0–52.0)
HEMOGLOBIN: 15.2 g/dL (ref 13.0–17.0)
LYMPHS PCT: 29.5 % (ref 12.0–46.0)
Lymphs Abs: 2.4 10*3/uL (ref 0.7–4.0)
MCHC: 33.8 g/dL (ref 30.0–36.0)
MCV: 87.6 fl (ref 78.0–100.0)
MONOS PCT: 6.1 % (ref 3.0–12.0)
Monocytes Absolute: 0.5 10*3/uL (ref 0.1–1.0)
Neutro Abs: 4.8 10*3/uL (ref 1.4–7.7)
Neutrophils Relative %: 59 % (ref 43.0–77.0)
Platelets: 218 10*3/uL (ref 150.0–400.0)
RBC: 5.15 Mil/uL (ref 4.22–5.81)
RDW: 12.8 % (ref 11.5–14.6)
WBC: 8.1 10*3/uL (ref 4.5–10.5)

## 2014-01-13 LAB — COMPREHENSIVE METABOLIC PANEL
ALT: 22 U/L (ref 0–53)
AST: 20 U/L (ref 0–37)
Albumin: 4.2 g/dL (ref 3.5–5.2)
Alkaline Phosphatase: 81 U/L (ref 39–117)
BUN: 17 mg/dL (ref 6–23)
CHLORIDE: 105 meq/L (ref 96–112)
CO2: 29 meq/L (ref 19–32)
Calcium: 9.9 mg/dL (ref 8.4–10.5)
Creatinine, Ser: 1.2 mg/dL (ref 0.4–1.5)
GFR: 65.84 mL/min (ref >60.00)
Glucose, Bld: 90 mg/dL (ref 70–99)
Potassium: 4.4 mEq/L (ref 3.5–5.1)
Sodium: 141 mEq/L (ref 135–145)
Total Bilirubin: 2.3 mg/dL — ABNORMAL HIGH (ref 0.3–1.2)
Total Protein: 6.9 g/dL (ref 6.0–8.3)

## 2014-01-13 LAB — LIPID PANEL
Cholesterol: 173 mg/dL (ref 0–200)
HDL: 33.5 mg/dL — AB (ref >39.00)
LDL CALC: 119 mg/dL — AB (ref 0–99)
TRIGLYCERIDES: 102 mg/dL (ref 0.0–149.0)
Total CHOL/HDL Ratio: 5
VLDL: 20.4 mg/dL (ref 0.0–40.0)

## 2014-01-13 LAB — T4, FREE: Free T4: 0.95 ng/dL (ref 0.60–1.60)

## 2014-01-13 LAB — TSH: TSH: 1.51 u[IU]/mL (ref 0.35–5.50)

## 2014-01-13 LAB — PSA: PSA: 1.6 ng/mL (ref 0.10–4.00)

## 2014-01-13 LAB — HEMOGLOBIN A1C: Hgb A1c MFr Bld: 5.5 % (ref 4.6–6.5)

## 2014-01-13 NOTE — Telephone Encounter (Signed)
Relevant patient education mailed to patient.  

## 2014-01-14 ENCOUNTER — Encounter: Payer: Self-pay | Admitting: *Deleted

## 2014-02-09 ENCOUNTER — Encounter: Payer: Self-pay | Admitting: Internal Medicine

## 2014-02-09 ENCOUNTER — Telehealth: Payer: Self-pay | Admitting: Internal Medicine

## 2014-02-09 ENCOUNTER — Ambulatory Visit (INDEPENDENT_AMBULATORY_CARE_PROVIDER_SITE_OTHER): Payer: BC Managed Care – PPO | Admitting: Internal Medicine

## 2014-02-09 VITALS — BP 120/70 | HR 95 | Temp 98.0°F | Wt 231.0 lb

## 2014-02-09 DIAGNOSIS — K529 Noninfective gastroenteritis and colitis, unspecified: Secondary | ICD-10-CM | POA: Insufficient documentation

## 2014-02-09 DIAGNOSIS — K5289 Other specified noninfective gastroenteritis and colitis: Secondary | ICD-10-CM

## 2014-02-09 MED ORDER — PROMETHAZINE HCL 25 MG PO TABS: 25.0000 mg | ORAL_TABLET | Freq: Four times a day (QID) | ORAL | Status: DC | PRN

## 2014-02-09 NOTE — Telephone Encounter (Signed)
Relevant patient education mailed to patient.  

## 2014-02-09 NOTE — Progress Notes (Signed)
   Subjective:    Patient ID: Alex [name], male    DOB: 25-Feb-1961, 53 y.o.   MRN: [id number]  HPI Feels exhaused Sick since 3 days ago--got off work and then went out for lawnmowing. First nausea and vomiting then Went right to bed---awoke and still sick  Not able to eat much Has nausea and vomiting---also diarrhea Thought he was getting better and ate dinner last  Total 7-8 watery stools since this started. No blood  Some periumbilical abdominal pain--comes and goes No fever  Current Outpatient Prescriptions on File Prior to Visit  Medication Sig Dispense Refill  . bisoprolol-hydrochlorothiazide (ZIAC) 2.5-6.25 MG per tablet Take 1 tablet by mouth daily.  90 tablet  3  . tadalafil (CIALIS) 20 MG tablet Take 1 tablet (20 mg total) by mouth daily as needed for erectile dysfunction.  10 tablet  1   No current facility-administered medications on file prior to visit.    No Known Allergies  Past Medical History  Diagnosis Date  . RBBB (right bundle branch block)   . HTN (hypertension)   . OSA (obstructive sleep apnea)     PSG 09/23/10 AHI 22    Past Surgical History  Procedure Laterality Date  . Appendectomy  1988  . Tonsillectomy    . Vasectomy  1997   . Fracture l arm      Family History  Problem Relation Age of Onset  . Stroke Father 8    doing okay  . Skin cancer Mother   . Stroke Paternal Grandfather     and PGGF  . Hypertension      dad's side   . Diabetes Neg Hx     DM   . Coronary artery disease Neg Hx   . Prostate cancer Neg Hx     no colon cancer     History   Social History  . Marital Status: Married    Spouse Name: N/A    Number of Children: 2  . Years of Education: N/A   Occupational History  . [employer] officer    Social History Main Topics  . Smoking status: Never Smoker   . Smokeless tobacco: Current User    Types: Chew     Comment: rare cigarillo   . Alcohol Use: Yes  . Drug Use: No  . Sexual Activity: Not on file    Other Topics Concern  . Not on file   Social History Narrative   Married, 2 children.   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        Review of Systems No tainted food MIL with pneumonia--- is in [facility] No cough Slight SOB though--especially when the abdominal pain comes on    Objective:   Physical Exam  Constitutional: He appears well-developed and well-nourished. No distress.  Neck: Normal range of motion. Neck supple. No thyromegaly present.  Pulmonary/Chest: Effort normal and breath sounds normal. No respiratory distress. He has no wheezes. He has no rales.  No dullness  Abdominal: Soft. Bowel sounds are normal. He exhibits no distension. There is no tenderness. There is no rebound.  Slight guarding but no tenderness  Lymphadenopathy:    He has no cervical adenopathy.          Assessment & Plan:

## 2014-02-09 NOTE — Patient Instructions (Signed)
Viral Gastroenteritis Viral gastroenteritis is also known as stomach flu. This condition affects the stomach and intestinal tract. It can cause sudden diarrhea and vomiting. The illness typically lasts 3 to 8 days. Most people develop an immune response that eventually gets rid of the virus. While this natural response develops, the virus can make you quite ill. CAUSES  Many different viruses can cause gastroenteritis, such as rotavirus or noroviruses. You can catch one of these viruses by consuming contaminated food or water. You may also catch a virus by sharing utensils or other personal items with an infected person or by touching a contaminated surface. SYMPTOMS  The most common symptoms are diarrhea and vomiting. These problems can cause a severe loss of body fluids (dehydration) and a body salt (electrolyte) imbalance. Other symptoms may include:  Fever.  Headache.  Fatigue.  Abdominal pain. DIAGNOSIS  Your caregiver can usually diagnose viral gastroenteritis based on your symptoms and a physical exam. A stool sample may also be taken to test for the presence of viruses or other infections. TREATMENT  This illness typically goes away on its own. Treatments are aimed at rehydration. The most serious cases of viral gastroenteritis involve vomiting so severely that you are not able to keep fluids down. In these cases, fluids must be given through an intravenous line (IV). HOME CARE INSTRUCTIONS   Drink enough fluids to keep your urine clear or pale yellow. Drink small amounts of fluids frequently and increase the amounts as tolerated.  Ask your caregiver for specific rehydration instructions.  Avoid:  Foods high in sugar.  Alcohol.  Carbonated drinks.  Tobacco.  Juice.  Caffeine drinks.  Extremely hot or cold fluids.  Fatty, greasy foods.  Too much intake of anything at one time.  Dairy products until 24 to 48 hours after diarrhea stops.  You may consume probiotics.  Probiotics are active cultures of beneficial bacteria. They may lessen the amount and number of diarrheal stools in adults. Probiotics can be found in yogurt with active cultures and in supplements.  Wash your hands well to avoid spreading the virus.  Only take over-the-counter or prescription medicines for pain, discomfort, or fever as directed by your caregiver. Do not give aspirin to children. Antidiarrheal medicines are not recommended.  Ask your caregiver if you should continue to take your regular prescribed and over-the-counter medicines.  Keep all follow-up appointments as directed by your caregiver. SEEK IMMEDIATE MEDICAL CARE IF:   You are unable to keep fluids down.  You do not urinate at least once every 6 to 8 hours.  You develop shortness of breath.  You notice blood in your stool or vomit. This may look like coffee grounds.  You have abdominal pain that increases or is concentrated in one small area (localized).  You have persistent vomiting or diarrhea.  You have a fever.  The patient is a child younger than 3 months, and he or she has a fever.  The patient is a child older than 3 months, and he or she has a fever and persistent symptoms.  The patient is a child older than 3 months, and he or she has a fever and symptoms suddenly get worse.  The patient is a baby, and he or she has no tears when crying. MAKE SURE YOU:   Understand these instructions.  Will watch your condition.  Will get help right away if you are not doing well or get worse. Document Released: 09/11/2005 Document Revised: 12/04/2011 Document Reviewed: 06/28/2011   Exit[employer] Patient Information 2014 [employer]  

## 2014-02-09 NOTE — Assessment & Plan Note (Signed)
Probably self limited viral infection Overdid it eating last night (steak,onions, potatoes, etc)  Discussed slow reintroduction of food Not dehydrated  Will prescribe some phenergan for prn use

## 2014-02-20 ENCOUNTER — Ambulatory Visit (AMBULATORY_SURGERY_CENTER): Payer: Self-pay

## 2014-02-20 VITALS — Ht 73.0 in | Wt 232.0 lb

## 2014-02-20 DIAGNOSIS — Z1211 Encounter for screening for malignant neoplasm of colon: Secondary | ICD-10-CM

## 2014-02-20 MED ORDER — MOVIPREP 100 G PO SOLR: 1.0000 | Freq: Once | ORAL | Status: DC

## 2014-02-20 NOTE — Progress Notes (Signed)
No allergies to eggs or soy No home oxygen (uses CPAP) No diet/weight loss meds No past problems with anesthesia Has email  Emmi instructions given for colonoscopy

## 2014-03-03 ENCOUNTER — Other Ambulatory Visit: Payer: Self-pay | Admitting: *Deleted

## 2014-03-03 MED ORDER — BISOPROLOL-HYDROCHLOROTHIAZIDE 2.5-6.25 MG PO TABS: 1.0000 | ORAL_TABLET | Freq: Every day | ORAL | Status: DC

## 2014-03-05 ENCOUNTER — Encounter: Payer: Self-pay | Admitting: Internal Medicine

## 2014-03-05 ENCOUNTER — Ambulatory Visit (AMBULATORY_SURGERY_CENTER): Payer: BC Managed Care – PPO | Admitting: Internal Medicine

## 2014-03-05 VITALS — BP 149/97 | HR 62 | Temp 97.0°F | Resp 20 | Ht 73.0 in | Wt 232.0 lb

## 2014-03-05 DIAGNOSIS — Z1211 Encounter for screening for malignant neoplasm of colon: Secondary | ICD-10-CM

## 2014-03-05 MED ORDER — SODIUM CHLORIDE 0.9 % IV SOLN: 500.0000 mL | INTRAVENOUS | Status: DC

## 2014-03-05 NOTE — Progress Notes (Signed)
Pt stable to RR 

## 2014-03-05 NOTE — Patient Instructions (Signed)
Discharge instructions given with verbal understanding. Handouts on diverticulosis and a high fiber diet. Resume previous medications. YOU HAD AN ENDOSCOPIC PROCEDURE TODAY AT THE [facility]: Refer to the procedure report that was given to you for any specific questions about what was found during the examination.  If the procedure report does not answer your questions, please call your gastroenterologist to clarify.  If you requested that your care partner not be given the details of your procedure findings, then the procedure report has been included in a sealed envelope for you to review at your convenience later.  YOU SHOULD EXPECT: Some feelings of bloating in the abdomen. Passage of more gas than usual.  Walking can help get rid of the air that was put into your GI tract during the procedure and reduce the bloating. If you had a lower endoscopy (such as a colonoscopy or flexible sigmoidoscopy) you may notice spotting of blood in your stool or on the toilet paper. If you underwent a bowel prep for your procedure, then you may not have a normal bowel movement for a few days.  DIET: Your first meal following the procedure should be a light meal and then it is ok to progress to your normal diet.  A half-sandwich or bowl of soup is an example of a good first meal.  Heavy or fried foods are harder to digest and may make you feel nauseous or bloated.  Likewise meals heavy in dairy and vegetables can cause extra gas to form and this can also increase the bloating.  Drink plenty of fluids but you should avoid alcoholic beverages for 24 hours.  ACTIVITY: Your care partner should take you home directly after the procedure.  You should plan to take it easy, moving slowly for the rest of the day.  You can resume normal activity the day after the procedure however you should NOT DRIVE or use heavy machinery for 24 hours (because of the sedation medicines used during the test).    SYMPTOMS TO REPORT  IMMEDIATELY: A gastroenterologist can be reached at any hour.  During normal business hours, 8:30 AM to 5:00 PM Monday through Friday, call [phone].  After hours and on weekends, please call the GI answering service at [phone] who will take a message and have the physician on call contact you.   Following lower endoscopy (colonoscopy or flexible sigmoidoscopy):  Excessive amounts of blood in the stool  Significant tenderness or worsening of abdominal pains  Swelling of the abdomen that is new, acute  Fever of 100F or higher FOLLOW UP: If any biopsies were taken you will be contacted by phone or by letter within the next 1-3 weeks.  Call your gastroenterologist if you have not heard about the biopsies in 3 weeks.  Our staff will call the home number listed on your records the next business day following your procedure to check on you and address any questions or concerns that you may have at that time regarding the information given to you following your procedure. This is a courtesy call and so if there is no answer at the home number and we have not heard from you through the emergency physician on call, we will assume that you have returned to your regular daily activities without incident.  SIGNATURES/CONFIDENTIALITY: You and/or your care partner have signed paperwork which will be entered into your electronic medical record.  These signatures attest to the fact that that the information above on your After   Visit Summary has been reviewed and is understood.  Full responsibility of the confidentiality of this discharge information lies with you and/or your care-partner. 

## 2014-03-05 NOTE — Op Note (Signed)
[place]  [address] [place], [address]   COLONOSCOPY PROCEDURE REPORT  PATIENT: [name], Alex  MR#: [id number] BIRTHDATE: 06/04/1961 , 52  yrs. old GENDER: Male ENDOSCOPIST: Jerene Bears, MD REFERRED FH:[id number] Silvio Pate, M.D. PROCEDURE DATE:  03/05/2014 PROCEDURE:   Colonoscopy, screening First Screening Colonoscopy - Avg.  risk and is 50 yrs.  old or older - No.  Prior Negative Screening - Now for repeat screening. N/A  History of Adenoma - Now for follow-up colonoscopy & has been > or = to 3 yrs.  N/A  Polyps Removed Today? No.  Recommend repeat exam, <10 yrs? No. ASA CLASS:   Class II INDICATIONS:average risk screening and first colonoscopy. MEDICATIONS: MAC sedation, administered by CRNA and propofol (Diprivan) 200mg  IV  DESCRIPTION OF PROCEDURE:   After the risks benefits and alternatives of the procedure were thoroughly explained, informed consent was obtained.  A digital rectal exam revealed no rectal mass.   The [id number]  endoscope was introduced through the anus and advanced to the cecum, which was identified by both the appendix and ileocecal valve. No adverse events experienced. The quality of the prep was good, using MoviPrep  The instrument was then slowly withdrawn as the colon was fully examined.   COLON FINDINGS: Mild diverticulosis was noted in the ascending colon, descending colon, and sigmoid colon.   The colon was otherwise normal.  There was no inflammation, polyps or cancers seen.  Retroflexed views revealed no abnormalities. The time to cecum=4 minutes 38 seconds.  Withdrawal time=11 minutes 07 seconds. The scope was withdrawn and the procedure completed.  COMPLICATIONS: There were no complications.  ENDOSCOPIC IMPRESSION: 1.   Mild diverticulosis was noted in the ascending colon, descending colon, and sigmoid colon 2.   The colon was otherwise normal  RECOMMENDATIONS: 1.  High fiber diet 2.  You should  continue to follow colorectal cancer screening guidelines for "routine risk" patients with a repeat colonoscopy in 10 years.  There is no need for FOBT (stool) testing for at least 5 years.   eSigned:  Jerene Bears, MD 03/05/2014 8:26 AM   cc: The Patient and Venia Carbon, MD

## 2014-03-06 ENCOUNTER — Telehealth: Payer: Self-pay | Admitting: *Deleted

## 2014-03-06 NOTE — Telephone Encounter (Signed)
  Follow up Call-  Call back number 03/05/2014  Post procedure Call Back phone  # [phone]- pt's wife (call her to see how pt doing as pt will be at [facility] mess ,wife works night shift  Permission to leave phone message Yes     Patient questions:  Do you have a fever, pain , or abdominal swelling? no Pain Score  0 *  Have you tolerated food without any problems? yes  Have you been able to return to your normal activities? yes  Do you have any questions about your discharge instructions: Diet   no Medications  no Follow up visit  no  Do you have questions or concerns about your Care? no  Actions: * If pain score is 4 or above: No action needed, pain <4.

## 2014-04-30 ENCOUNTER — Ambulatory Visit (INDEPENDENT_AMBULATORY_CARE_PROVIDER_SITE_OTHER): Payer: BC Managed Care – PPO | Admitting: Internal Medicine

## 2014-04-30 ENCOUNTER — Encounter: Payer: Self-pay | Admitting: Internal Medicine

## 2014-04-30 VITALS — BP 120/80 | HR 86 | Temp 98.0°F | Wt 228.0 lb

## 2014-04-30 DIAGNOSIS — S8010XA Contusion of unspecified lower leg, initial encounter: Secondary | ICD-10-CM

## 2014-04-30 DIAGNOSIS — S8012XA Contusion of left lower leg, initial encounter: Secondary | ICD-10-CM

## 2014-04-30 MED ORDER — SILDENAFIL CITRATE 20 MG PO TABS: 60.0000 mg | ORAL_TABLET | Freq: Every day | ORAL | Status: DC | PRN

## 2014-04-30 NOTE — Progress Notes (Signed)
   Subjective:    Patient ID: Alex [name], male    DOB: June 09, 1961, 53 y.o.   MRN: [id number]  HPI Hit with baseball in left upper medial calf 6 days ago No rx when this happened Now notes ongoing tightness  Now with bruising at medial ankle also  Having ongoing pain in right 5th finger Had angrily pushed wall about 3 months ago Clearly better now but still gets some pain  No meds for this either  Current Outpatient Prescriptions on File Prior to Visit  Medication Sig Dispense Refill  . bisoprolol-hydrochlorothiazide (ZIAC) 2.5-6.25 MG per tablet Take 1 tablet by mouth daily.  90 tablet  3  . promethazine (PHENERGAN) 25 MG tablet Take 1 tablet (25 mg total) by mouth every 6 (six) hours as needed for nausea or vomiting.  20 tablet  0  . tadalafil (CIALIS) 20 MG tablet Take 1 tablet (20 mg total) by mouth daily as needed for erectile dysfunction.  10 tablet  1   No current facility-administered medications on file prior to visit.    No Known Allergies  Past Medical History  Diagnosis Date  . RBBB (right bundle branch block)   . HTN (hypertension)   . OSA (obstructive sleep apnea)     PSG 09/23/10 AHI 22    Past Surgical History  Procedure Laterality Date  . Appendectomy  1988  . Tonsillectomy    . Vasectomy  1997   . Fracture l arm      Family History  Problem Relation Age of Onset  . Stroke Father 41    doing okay  . Skin cancer Mother   . Stroke Paternal Grandfather     and PGGF  . Hypertension      dad's side   . Diabetes Neg Hx     DM   . Coronary artery disease Neg Hx   . Prostate cancer Neg Hx     no colon cancer   . Colon cancer Neg Hx      Review of Systems Still walking and running on it No fever     Objective:   Physical Exam  Musculoskeletal:  No tenderness or loss of function in right hand  Moderate sized hematoma medial left calf Not really tender Blood has pooled at left medial ankle but no tenderness or restriction in motion            Assessment & Plan:

## 2014-04-30 NOTE — Progress Notes (Signed)
Pre visit review using our clinic review tool, if applicable. No additional management support is needed unless otherwise documented below in the visit note. 

## 2014-04-30 NOTE — Assessment & Plan Note (Signed)
Uncomplicated Discussed ice if any pain after running (and after acute injury if something happens again) No meds needed now reassured

## 2014-04-30 NOTE — Patient Instructions (Signed)
Contusion °A contusion is a deep bruise. Contusions are the result of an injury that caused bleeding under the skin. The contusion may turn blue, purple, or yellow. Minor injuries will give you a painless contusion, but more severe contusions may stay painful and swollen for a few weeks.  °CAUSES  °A contusion is usually caused by a blow, trauma, or direct force to an area of the body. °SYMPTOMS  °· Swelling and redness of the injured area. °· Bruising of the injured area. °· Tenderness and soreness of the injured area. °· Pain. °DIAGNOSIS  °The diagnosis can be made by taking a history and physical exam. An X-ray, CT scan, or MRI may be needed to determine if there were any associated injuries, such as fractures. °TREATMENT  °Specific treatment will depend on what area of the body was injured. In general, the best treatment for a contusion is resting, icing, elevating, and applying cold compresses to the injured area. Over-the-counter medicines may also be recommended for pain control. Ask your caregiver what the best treatment is for your contusion. °HOME CARE INSTRUCTIONS  °· Put ice on the injured area. °¨ Put ice in a plastic bag. °¨ Place a towel between your skin and the bag. °¨ Leave the ice on for 15-20 minutes, 3-4 times a day, or as directed by your health care provider. °· Only take over-the-counter or prescription medicines for pain, discomfort, or fever as directed by your caregiver. Your caregiver may recommend avoiding anti-inflammatory medicines (aspirin, ibuprofen, and naproxen) for 48 hours because these medicines may increase bruising. °· Rest the injured area. °· If possible, elevate the injured area to reduce swelling. °SEEK IMMEDIATE MEDICAL CARE IF:  °· You have increased bruising or swelling. °· You have pain that is getting worse. °· Your swelling or pain is not relieved with medicines. °MAKE SURE YOU:  °· Understand these instructions. °· Will watch your condition. °· Will get help right  away if you are not doing well or get worse. °Document Released: 06/21/2005 Document Revised: 09/16/2013 Document Reviewed: 07/17/2011 °ExitCare® Patient Information ©2015 [employer]. This information is not intended to replace advice given to you by your health care provider. Make sure you discuss any questions you have with your health care provider. ° °

## 2014-05-18 ENCOUNTER — Other Ambulatory Visit: Payer: Self-pay | Admitting: *Deleted

## 2014-05-18 MED ORDER — TADALAFIL 20 MG PO TABS: 20.0000 mg | ORAL_TABLET | Freq: Every day | ORAL | Status: DC | PRN

## 2015-01-14 ENCOUNTER — Encounter: Payer: BC Managed Care – PPO | Admitting: Internal Medicine

## 2015-02-05 ENCOUNTER — Ambulatory Visit (INDEPENDENT_AMBULATORY_CARE_PROVIDER_SITE_OTHER): Payer: BC Managed Care – PPO | Admitting: Internal Medicine

## 2015-02-05 ENCOUNTER — Encounter: Payer: Self-pay | Admitting: Internal Medicine

## 2015-02-05 VITALS — BP 128/90 | HR 66 | Temp 97.6°F | Ht 73.0 in | Wt 237.0 lb

## 2015-02-05 DIAGNOSIS — Z0000 Encounter for general adult medical examination without abnormal findings: Secondary | ICD-10-CM | POA: Diagnosis not present

## 2015-02-05 DIAGNOSIS — G4733 Obstructive sleep apnea (adult) (pediatric): Secondary | ICD-10-CM | POA: Diagnosis not present

## 2015-02-05 DIAGNOSIS — I10 Essential (primary) hypertension: Secondary | ICD-10-CM

## 2015-02-05 LAB — CBC WITH DIFFERENTIAL/PLATELET
Basophils Absolute: 0 10*3/uL (ref 0.0–0.1)
Basophils Relative: 0.5 % (ref 0.0–3.0)
EOS ABS: 0.4 10*3/uL (ref 0.0–0.7)
EOS PCT: 4.6 % (ref 0.0–5.0)
HCT: 45.1 % (ref 39.0–52.0)
Hemoglobin: 15.4 g/dL (ref 13.0–17.0)
Lymphocytes Relative: 32.7 % (ref 12.0–46.0)
Lymphs Abs: 2.7 10*3/uL (ref 0.7–4.0)
MCHC: 34.2 g/dL (ref 30.0–36.0)
MCV: 84.6 fl (ref 78.0–100.0)
MONOS PCT: 6 % (ref 3.0–12.0)
Monocytes Absolute: 0.5 10*3/uL (ref 0.1–1.0)
NEUTROS ABS: 4.7 10*3/uL (ref 1.4–7.7)
Neutrophils Relative %: 56.2 % (ref 43.0–77.0)
PLATELETS: 239 10*3/uL (ref 150.0–400.0)
RBC: 5.33 Mil/uL (ref 4.22–5.81)
RDW: 13 % (ref 11.5–15.5)
WBC: 8.4 10*3/uL (ref 4.0–10.5)

## 2015-02-05 LAB — COMPREHENSIVE METABOLIC PANEL
ALT: 31 U/L (ref 0–53)
AST: 23 U/L (ref 0–37)
Albumin: 4.5 g/dL (ref 3.5–5.2)
Alkaline Phosphatase: 89 U/L (ref 39–117)
BUN: 15 mg/dL (ref 6–23)
CO2: 29 mEq/L (ref 19–32)
CREATININE: 1.04 mg/dL (ref 0.40–1.50)
Calcium: 9.8 mg/dL (ref 8.4–10.5)
Chloride: 102 mEq/L (ref 96–112)
GFR: 79.58 mL/min (ref >60.00)
GLUCOSE: 93 mg/dL (ref 70–99)
Potassium: 3.9 mEq/L (ref 3.5–5.1)
SODIUM: 136 meq/L (ref 135–145)
TOTAL PROTEIN: 7.3 g/dL (ref 6.0–8.3)
Total Bilirubin: 1.5 mg/dL — ABNORMAL HIGH (ref 0.2–1.2)

## 2015-02-05 LAB — T4, FREE: Free T4: 0.63 ng/dL (ref 0.60–1.60)

## 2015-02-05 NOTE — Progress Notes (Signed)
Subjective:    Patient ID: Alex [name], male    DOB: 1961-03-25, 54 y.o.   MRN: [id number]  HPI Here for physical  Doing well No new concerns Did have a cough for awhile--with some wheezing--but finally improved Trying to walk some--plays softball Considering joining a gym again  Sleeps well with the CPAP  Still on BP med No problems with that  Current Outpatient Prescriptions on File Prior to Visit  Medication Sig Dispense Refill  . bisoprolol-hydrochlorothiazide (ZIAC) 2.5-6.25 MG per tablet Take 1 tablet by mouth daily. 90 tablet 3  . sildenafil (REVATIO) 20 MG tablet Take 3-5 tablets (60-100 mg total) by mouth daily as needed. 50 tablet 11  . tadalafil (CIALIS) 20 MG tablet Take 1 tablet (20 mg total) by mouth daily as needed for erectile dysfunction. 10 tablet 1   No current facility-administered medications on file prior to visit.    No Known Allergies  Past Medical History  Diagnosis Date  . RBBB (right bundle branch block)   . HTN (hypertension)   . OSA (obstructive sleep apnea)     PSG 09/23/10 AHI 22    Past Surgical History  Procedure Laterality Date  . Appendectomy  1988  . Tonsillectomy    . Vasectomy  1997   . Fracture l arm      Family History  Problem Relation Age of Onset  . Stroke Father 71    doing okay  . Skin cancer Mother   . Stroke Paternal Grandfather     and PGGF  . Hypertension      dad's side   . Diabetes Neg Hx     DM   . Coronary artery disease Neg Hx   . Prostate cancer Neg Hx     no colon cancer   . Colon cancer Neg Hx     History   Social History  . Marital Status: Married    Spouse Name: N/A  . Number of Children: 2  . Years of Education: N/A   Occupational History  . Corrections officer    Social History Main Topics  . Smoking status: Never Smoker   . Smokeless tobacco: Current User    Types: Chew     Comment: rare cigarillo   . Alcohol Use: Yes     Comment: once monthly on average  . Drug Use:  No  . Sexual Activity: Not on file   Other Topics Concern  . Not on file   Social History Narrative   Married, 2 children.   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        Review of Systems  Constitutional: Positive for unexpected weight change. Negative for fatigue.       Wears seat belt  HENT: Positive for tinnitus. Negative for dental problem and hearing loss.        Overdue for dentist  Eyes: Negative for visual disturbance.       No diplopia or unilateral vision loss Has new reading glasses  Respiratory: Negative for cough, chest tightness and shortness of breath.   Cardiovascular: Negative for chest pain, palpitations and leg swelling.  Gastrointestinal: Negative for nausea, vomiting, abdominal pain, constipation and blood in stool.  Endocrine: Negative for polydipsia and polyuria.  Genitourinary: Negative for urgency, frequency and difficulty urinating.       No sexual problems  Musculoskeletal: Negative for back pain, joint swelling and arthralgias.  Skin: Negative for rash.  No suspicious lesions now--lesion on back removed recently  Allergic/Immunologic: Negative for environmental allergies and immunocompromised state.  Neurological: Negative for dizziness, syncope, weakness, light-headedness, numbness and headaches.  Hematological: Negative for adenopathy. Does not bruise/bleed easily.  Psychiatric/Behavioral: Negative for sleep disturbance and dysphoric mood. The patient is nervous/anxious.        Occ work stress--nothing major       Objective:   Physical Exam  Constitutional: He is oriented to person, place, and time. He appears well-developed and well-nourished. No distress.  HENT:  Head: Normocephalic and atraumatic.  Right Ear: External ear normal.  Left Ear: External ear normal.  Mouth/Throat: Oropharynx is clear and moist. No oropharyngeal exudate.  Eyes: Conjunctivae and EOM are normal. Pupils are equal, round, and reactive to light.  Neck:  Normal range of motion. Neck supple. No thyromegaly present.  Cardiovascular: Normal rate, regular rhythm, normal heart sounds and intact distal pulses.  Exam reveals no gallop.   No murmur heard. Pulmonary/Chest: Effort normal and breath sounds normal. No respiratory distress. He has no wheezes. He has no rales.  Abdominal: Soft. There is no tenderness.  Musculoskeletal: He exhibits no edema or tenderness.  Neurological: He is alert and oriented to person, place, and time.  Skin: No rash noted. No erythema.  Multiple benign nevi  Psychiatric: He has a normal mood and affect. His behavior is normal.          Assessment & Plan:

## 2015-02-05 NOTE — Assessment & Plan Note (Signed)
BP Readings from Last 3 Encounters:  02/05/15 128/90  04/30/14 120/80  03/05/14 149/97   Good control No changes needed

## 2015-02-05 NOTE — Progress Notes (Signed)
Pre visit review using our clinic review tool, if applicable. No additional management support is needed unless otherwise documented below in the visit note. 

## 2015-02-05 NOTE — Assessment & Plan Note (Signed)
Does well on the CPAP

## 2015-02-05 NOTE — Assessment & Plan Note (Signed)
Healthy but needs to work on fitness Will defer PSA to next year Recent colonoscopy

## 2015-02-08 ENCOUNTER — Encounter: Payer: Self-pay | Admitting: *Deleted

## 2015-04-16 ENCOUNTER — Other Ambulatory Visit: Payer: Self-pay | Admitting: Internal Medicine

## 2015-05-14 ENCOUNTER — Other Ambulatory Visit: Payer: Self-pay | Admitting: Internal Medicine

## 2015-09-01 ENCOUNTER — Encounter: Payer: Self-pay | Admitting: Internal Medicine

## 2015-09-01 ENCOUNTER — Ambulatory Visit (INDEPENDENT_AMBULATORY_CARE_PROVIDER_SITE_OTHER): Payer: BC Managed Care – PPO | Admitting: Internal Medicine

## 2015-09-01 VITALS — BP 150/90 | HR 74 | Temp 97.6°F | Wt 242.0 lb

## 2015-09-01 DIAGNOSIS — M79671 Pain in right foot: Secondary | ICD-10-CM | POA: Insufficient documentation

## 2015-09-01 NOTE — Progress Notes (Signed)
Pre visit review using our clinic review tool, if applicable. No additional management support is needed unless otherwise documented below in the visit note. 

## 2015-09-01 NOTE — Patient Instructions (Signed)
If arch support inserts are not helping, please set up an appointment with our sports medicine doctor-- Dr Lorelei Pont

## 2015-09-01 NOTE — Progress Notes (Signed)
   Subjective:    Patient ID: Alex [name], male    DOB: 1961-01-17, 54 y.o.   MRN: [id number]  HPI Here due to heel pain--right  Noticed it about 6 weeks ago after playing his last softball tournament No know injury Pain since then---now with constant burning sensation Not really worse when up on it May depend on his activity level--will feel it after more walking  No new shoes--old work boots that haven't been changed in a long time (at his second job)  Current Outpatient Prescriptions on File Prior to Visit  Medication Sig Dispense Refill  . bisoprolol-hydrochlorothiazide (ZIAC) 2.5-6.25 MG per tablet TAKE 1 TABLET BY MOUTH ONCE DAILY. 90 tablet 3  . sildenafil (REVATIO) 20 MG tablet TAKE 3 TO 5 TABLETS BY MOUTH ONCE DAILY AS NEEDED. 50 tablet 11  . tadalafil (CIALIS) 20 MG tablet Take 1 tablet (20 mg total) by mouth daily as needed for erectile dysfunction. 10 tablet 1   No current facility-administered medications on file prior to visit.    No Known Allergies  Past Medical History  Diagnosis Date  . RBBB (right bundle branch block)   . HTN (hypertension)   . OSA (obstructive sleep apnea)     PSG 09/23/10 AHI 22    Past Surgical History  Procedure Laterality Date  . Appendectomy  1988  . Tonsillectomy    . Vasectomy  1997   . Fracture l arm      Family History  Problem Relation Age of Onset  . Stroke Father 75    doing okay  . Skin cancer Mother   . Stroke Paternal Grandfather     and PGGF  . Hypertension      dad's side   . Diabetes Neg Hx     DM   . Coronary artery disease Neg Hx   . Prostate cancer Neg Hx     no colon cancer   . Colon cancer Neg Hx     Social History   Social History  . Marital Status: Married    Spouse Name: N/A  . Number of Children: 2  . Years of Education: N/A   Occupational History  . Corrections officer    Social History Main Topics  . Smoking status: Never Smoker   . Smokeless tobacco: Current User    Types:  Chew     Comment: rare cigarillo   . Alcohol Use: Yes     Comment: once monthly on average  . Drug Use: No  . Sexual Activity: Not on file   Other Topics Concern  . Not on file   Social History Narrative   Married, 2 children.   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        Review of Systems  Weight is up a few pounds No swelling in joints No fever Occasional dizziness--- while up, he is trying to hold on--then has to get down     Objective:   Physical Exam  Musculoskeletal:  Preserved arch bilaterally No Achilles tenderness No true bony tenderness on right  Normal strength and ROM          Assessment & Plan:

## 2015-09-01 NOTE — Assessment & Plan Note (Signed)
No serious pathology Not true plantar fasciitis but I think it is related to strain from lack of arch support Discussed inserts--keeping up with replacement shoes, etc Ice prn

## 2016-01-08 ENCOUNTER — Other Ambulatory Visit: Payer: Self-pay | Admitting: Internal Medicine

## 2016-02-07 ENCOUNTER — Encounter: Payer: BC Managed Care – PPO | Admitting: Internal Medicine

## 2016-02-18 ENCOUNTER — Encounter: Payer: BC Managed Care – PPO | Admitting: Internal Medicine

## 2016-04-05 ENCOUNTER — Other Ambulatory Visit: Payer: Self-pay | Admitting: Internal Medicine

## 2016-04-13 ENCOUNTER — Encounter: Payer: BC Managed Care – PPO | Admitting: Internal Medicine

## 2016-05-26 ENCOUNTER — Other Ambulatory Visit: Payer: Self-pay | Admitting: Internal Medicine

## 2016-07-11 ENCOUNTER — Other Ambulatory Visit: Payer: Self-pay | Admitting: Internal Medicine

## 2016-08-21 ENCOUNTER — Ambulatory Visit
Admission: EM | Admit: 2016-08-21 | Discharge: 2016-08-21 | Disposition: A | Discharge status: Home / Self Care | Payer: BC Managed Care – PPO | Attending: Family Medicine | Admitting: Family Medicine

## 2016-08-21 DIAGNOSIS — S51811A Laceration without foreign body of right forearm, initial encounter: Secondary | ICD-10-CM | POA: Diagnosis not present

## 2016-08-21 MED ORDER — MUPIROCIN 2 % EX OINT: 1.0000 "application " | TOPICAL_OINTMENT | Freq: Three times a day (TID) | CUTANEOUS | 0 refills | Status: DC

## 2016-08-21 MED ORDER — CEFUROXIME AXETIL 500 MG PO TABS: 500.0000 mg | ORAL_TABLET | Freq: Two times a day (BID) | ORAL | 0 refills | Status: DC

## 2016-08-21 MED ORDER — TETANUS-DIPHTH-ACELL PERTUSSIS 5-2.5-18.5 LF-MCG/0.5 IM SUSP
0.5000 mL | Freq: Once | INTRAMUSCULAR | Status: AC
Start: 2016-08-21 — End: 2016-08-21
  Administered 2016-08-21: 0.5 mL via INTRAMUSCULAR

## 2016-08-21 NOTE — ED Triage Notes (Signed)
Pt presents with a laceration on his right forearm. He was placing the recycling cans, and the aluminum cans cut him.

## 2016-08-21 NOTE — ED Provider Notes (Signed)
[facility]    CSN: [id number] Arrival date & time: 08/21/16  1209     History   Chief Complaint Chief Complaint  Patient presents with  . Laceration    right forearm    HPI Alex [name] is a 55 y.o. male.   Patient reports while pushing some bottles down and the recycling bin he pushed so hard he received a cut to his right forearm. States the most benefit can was as well as the inferior neck cut him. He does not know his last tetanus was. States it bled quite a bit but his wife some nursing help get the bleeding to stop. He has history of hypertension and sleep apnea and a history of right bundle branch block. He had fractured left arm tonsillectomy vasectomy and appendectomy. He is not allergic to any medication. He does not smoke. 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 Father's had a stroke mother has had skin cancer and his hypertension and his father's side of family   The history is provided by the patient. No language interpreter was used.  Laceration  Location:  Shoulder/arm Shoulder/arm laceration location:  R forearm Depth:  Through muscle Bleeding: controlled   Laceration mechanism:  Metal edge Pain details:    Quality:  Sharp and shooting   Progression:  Worsening Foreign body present:  No foreign bodies Relieved by:  Nothing Worsened by:  Nothing Ineffective treatments:  None tried Tetanus status:  Unknown Associated symptoms: no fever, no focal weakness, no redness, no swelling and no streaking     Past Medical History:  Diagnosis Date  . HTN (hypertension)   . OSA (obstructive sleep apnea)    PSG 09/23/10 AHI 22  . RBBB (right bundle branch block)     Patient Active Problem List   Diagnosis Date Noted  . Pain of right heel 09/01/2015  . Routine general medical examination at a health care facility 01/12/2014  . Obstructive sleep apnea 08/22/2010  . Essential hypertension, benign 01/03/2010    Past Surgical History:    Procedure Laterality Date  . APPENDECTOMY  1988  . fracture L arm    . TONSILLECTOMY    . VASECTOMY  1997        Home Medications    Prior to Admission medications   Medication Sig Start Date End Date Taking? Authorizing Provider  bisoprolol-hydrochlorothiazide (ZIAC) 2.5-6.25 MG tablet TAKE 1 TABLET BY MOUTH ONCE DAILY 07/11/16   Venia Carbon, MD  cefUROXime (CEFTIN) 500 MG tablet Take 1 tablet (500 mg total) by mouth 2 (two) times daily. 08/21/16   Frederich Cha, MD  mupirocin ointment (BACTROBAN) 2 % Apply 1 application topically 3 (three) times daily. 08/21/16   Frederich Cha, MD  sildenafil (REVATIO) 20 MG tablet TAKE 3 TO 5 TABLETS BY MOUTH ONCE DAILY AS NEEDED. 05/30/16   Venia Carbon, MD  tadalafil (CIALIS) 20 MG tablet Take 1 tablet (20 mg total) by mouth daily as needed for erectile dysfunction. 05/18/14   Venia Carbon, MD    Family History Family History  Problem Relation Age of Onset  . Stroke Father 57    doing okay  . Skin cancer Mother   . Stroke Paternal Grandfather     and PGGF  . Hypertension      dad's side   . Diabetes Neg Hx     DM   . Coronary artery disease Neg Hx   . Prostate cancer Neg Hx  no colon cancer   . Colon cancer Neg Hx     Social History Social History  Substance Use Topics  . Smoking status: Never Smoker  . Smokeless tobacco: Current User    Types: Chew     Comment: rare cigarillo   . Alcohol use Yes     Comment: once monthly on average     Allergies   Patient has no known allergies.   Review of Systems Review of Systems  Constitutional: Negative for fever.  Musculoskeletal:       Laceration of the r forearm  Neurological: Negative for focal weakness.  All other systems reviewed and are negative.    Physical Exam Triage Vital Signs ED Triage Vitals  Enc Vitals Group     BP 08/21/16 1323 (!) 147/91     Pulse Rate 08/21/16 1323 68     Resp 08/21/16 1323 18     Temp 08/21/16 1323 98 F (36.7 C)      Temp Source 08/21/16 1323 Oral     SpO2 08/21/16 1323 97 %     Weight 08/21/16 1322 235 lb (106.6 kg)     Height 08/21/16 1322 6\' 1"  (1.854 m)     Head Circumference --      Peak Flow --      Pain Score 08/21/16 1323 0     Pain Loc --      Pain Edu? --      Excl. in [place]? --    No data found.   Updated Vital Signs BP (!) 147/91 (BP Location: Left Arm)   Pulse 68   Temp 98 F (36.7 C) (Oral)   Resp 18   Ht 6\' 1"  (1.854 m)   Wt 235 lb (106.6 kg)   SpO2 97%   BMI 31.00 kg/m   Visual Acuity Right Eye Distance:   Left Eye Distance:   Bilateral Distance:    Right Eye Near:   Left Eye Near:    Bilateral Near:     Physical Exam  Constitutional: He is oriented to person, place, and time. He appears well-developed and well-nourished.  HENT:  Head: Normocephalic and atraumatic.  Eyes: Pupils are equal, round, and reactive to light.  Neck: Normal range of motion.  Pulmonary/Chest: Effort normal.  Musculoskeletal: Normal range of motion. He exhibits tenderness and deformity.  Neurological: He is alert and oriented to person, place, and time.  Skin: Skin is warm. Laceration noted. No rash noted. No cyanosis. Nails show no clubbing.     Psychiatric: His mood appears anxious.  Vitals reviewed.    UC Treatments / Results  Labs (all labs ordered are listed, but only abnormal results are displayed) Labs Reviewed - No data to display  EKG  EKG Interpretation None       Radiology No results found.  Procedures .Marland KitchenLaceration Repair Date/Time: 08/21/2016 3:41 PM Performed by: Frederich Cha Authorized by: Frederich Cha   Consent:    Consent obtained:  Verbal   Consent given by:  Patient   Risks discussed:  Pain, tendon damage and nerve damage Anesthesia (see MAR for exact dosages):    Anesthesia method:  Local infiltration   Local anesthetic:  Lidocaine 1% WITH epi Laceration details:    Location:  Shoulder/arm   Shoulder/arm location:  R lower arm   Length (cm):   9 Repair type:    Repair type:  Intermediate Pre-procedure details:    Preparation:  Patient was prepped and draped in usual sterile  fashion Exploration:    Hemostasis achieved with:  Direct pressure   Wound exploration: wound explored through full range of motion     Wound extent: fascia violated and muscle damage     Wound extent: no underlying fracture noted and no vascular damage noted     Contaminated: yes   Treatment:    Area cleansed with:  Betadine and soap and water   Amount of cleaning:  Standard   Irrigation solution:  Sterile saline   Irrigation volume:  40 ml   Irrigation method:  Syringe   Visualized foreign bodies/material removed: no   Skin repair:    Repair method:  Staples   Number of staples:  9 Approximation:    Approximation:  Close Post-procedure details:    Dressing:  Bulky dressing   Patient tolerance of procedure:  Tolerated well, no immediate complications Comments:     Patient initially tolerated the procedure well. However hematoma did develop which required pressure dressing and observation for about 30 minutes. Fortunately no signs of further bleeding or swelling was noted and the bleeding and hematomas dispersed among the wound site. Recommend pressure dressing and putting ice on this for the next 24 hours. Return in 12 days for suture removal   (including critical care time)  Medications Ordered in UC Medications  Tdap (BOOSTRIX) injection 0.5 mL (0.5 mLs Intramuscular Given 08/21/16 1504)     Initial Impression / Assessment and Plan / UC Course  I have reviewed the triage vital signs and the nursing notes.  Pertinent labs & imaging results that were available during my care of the patient were reviewed by me and considered in my medical decision making (see chart for details).  Clinical Course     Patient tolerated this stapling is very nervous and anxious during the repair. We'll place him on Ceftin 500 one tablet twice a day back pain  ointment and placed on Ceftin 500 mg 1 tablet twice a day for 10 days off in 10-12 days for staple removal  Final Clinical Impressions(s) / UC Diagnoses   Final diagnoses:  Forearm laceration, right, initial encounter    New Prescriptions Discharge Medication List as of 08/21/2016  3:23 PM    START taking these medications   Details  cefUROXime (CEFTIN) 500 MG tablet Take 1 tablet (500 mg total) by mouth 2 (two) times daily., Starting Mon 08/21/2016, Normal    mupirocin ointment (BACTROBAN) 2 % Apply 1 application topically 3 (three) times daily., Starting Mon 08/21/2016, Normal        Note: This dictation was prepared with Dragon dictation along with smaller phrase technology. Any transcriptional errors that result from this process are unintentional.   Frederich Cha, MD 08/21/16 [phone]

## 2016-08-23 ENCOUNTER — Encounter: Payer: BC Managed Care – PPO | Admitting: Internal Medicine

## 2016-09-05 ENCOUNTER — Encounter: Payer: Self-pay | Admitting: Emergency Medicine

## 2016-09-05 ENCOUNTER — Ambulatory Visit
Admission: EM | Admit: 2016-09-05 | Discharge: 2016-09-05 | Disposition: A | Discharge status: Home / Self Care | Payer: BC Managed Care – PPO | Attending: Family Medicine | Admitting: Family Medicine

## 2016-09-05 DIAGNOSIS — Z4801 Encounter for change or removal of surgical wound dressing: Secondary | ICD-10-CM

## 2016-09-05 DIAGNOSIS — Z4889 Encounter for other specified surgical aftercare: Secondary | ICD-10-CM

## 2016-09-05 NOTE — ED Triage Notes (Signed)
Patient here for staple removal.  Patient has redness around this site.

## 2016-09-05 NOTE — ED Provider Notes (Signed)
[facility]    CSN: [id number] Arrival date & time: 09/05/16  1413     History   Chief Complaint Chief Complaint  Patient presents with  . Suture / Staple Removal    HPI Alex [name] is a 55 y.o. male.   The history is provided by the patient. No language interpreter was used.  Suture / Staple Removal  This is a new problem. The current episode started more than 1 week ago. The problem has been gradually improving. Pertinent negatives include no chest pain, no abdominal pain, no headaches and no shortness of breath. Nothing aggravates the symptoms. Nothing relieves the symptoms. He has tried nothing for the symptoms. The treatment provided no relief.    Past Medical History:  Diagnosis Date  . HTN (hypertension)   . OSA (obstructive sleep apnea)    PSG 09/23/10 AHI 22  . RBBB (right bundle branch block)     Patient Active Problem List   Diagnosis Date Noted  . Pain of right heel 09/01/2015  . Routine general medical examination at a health care facility 01/12/2014  . Obstructive sleep apnea 08/22/2010  . Essential hypertension, benign 01/03/2010    Past Surgical History:  Procedure Laterality Date  . APPENDECTOMY  1988  . fracture L arm    . TONSILLECTOMY    . VASECTOMY  1997        Home Medications    Prior to Admission medications   Medication Sig Start Date End Date Taking? Authorizing Provider  bisoprolol-hydrochlorothiazide (ZIAC) 2.5-6.25 MG tablet TAKE 1 TABLET BY MOUTH ONCE DAILY 07/11/16   Venia Carbon, MD  cefUROXime (CEFTIN) 500 MG tablet Take 1 tablet (500 mg total) by mouth 2 (two) times daily. 08/21/16   Frederich Cha, MD  mupirocin ointment (BACTROBAN) 2 % Apply 1 application topically 3 (three) times daily. 08/21/16   Frederich Cha, MD  sildenafil (REVATIO) 20 MG tablet TAKE 3 TO 5 TABLETS BY MOUTH ONCE DAILY AS NEEDED. 05/30/16   Venia Carbon, MD  tadalafil (CIALIS) 20 MG tablet Take 1 tablet (20 mg total) by mouth daily  as needed for erectile dysfunction. 05/18/14   Venia Carbon, MD    Family History Family History  Problem Relation Age of Onset  . Stroke Father 59    doing okay  . Skin cancer Mother   . Stroke Paternal Grandfather     and PGGF  . Hypertension      dad's side   . Diabetes Neg Hx     DM   . Coronary artery disease Neg Hx   . Prostate cancer Neg Hx     no colon cancer   . Colon cancer Neg Hx     Social History Social History  Substance Use Topics  . Smoking status: Never Smoker  . Smokeless tobacco: Current User    Types: Chew     Comment: rare cigarillo   . Alcohol use Yes     Comment: once monthly on average     Allergies   Patient has no known allergies.   Review of Systems Review of Systems  Respiratory: Negative for shortness of breath.   Cardiovascular: Negative for chest pain.  Gastrointestinal: Negative for abdominal pain.  Neurological: Negative for headaches.  All other systems reviewed and are negative.    Physical Exam Triage Vital Signs ED Triage Vitals  Enc Vitals Group     BP 09/05/16 1444 115/79     Pulse Rate  09/05/16 1444 83     Resp 09/05/16 1444 16     Temp 09/05/16 1444 97.8 F (36.6 C)     Temp Source 09/05/16 1444 Tympanic     SpO2 09/05/16 1444 98 %     Weight 09/05/16 1443 235 lb (106.6 kg)     Height 09/05/16 1443 6\' 1"  (1.854 m)     Head Circumference --      Peak Flow --      Pain Score 09/05/16 1443 0     Pain Loc --      Pain Edu? --      Excl. in [place]? --    No data found.   Updated Vital Signs BP 115/79 (BP Location: Right Arm)   Pulse 83   Temp 97.8 F (36.6 C) (Tympanic)   Resp 16   Ht 6\' 1"  (1.854 m)   Wt 235 lb (106.6 kg)   SpO2 98%   BMI 31.00 kg/m   Visual Acuity Right Eye Distance:   Left Eye Distance:   Bilateral Distance:    Right Eye Near:   Left Eye Near:    Bilateral Near:     Physical Exam  Constitutional: He is oriented to person, place, and time. He appears well-developed and  well-nourished.  HENT:  Head: Normocephalic and atraumatic.  Neck: Normal range of motion.  Pulmonary/Chest: Effort normal.  Musculoskeletal: He exhibits tenderness.       Right wrist: He exhibits laceration.       Arms: The wound site does not look infected however patient has been on Ceftin twice a day and since finished up was about 2 or 3 days ago. Still appears be some possible opening of the wound.  Neurological: He is alert and oriented to person, place, and time.  Skin: Skin is warm.  Psychiatric: He has a normal mood and affect.  Vitals reviewed.    UC Treatments / Results  Labs (all labs ordered are listed, but only abnormal results are displayed) Labs Reviewed - No data to display  EKG  EKG Interpretation None       Radiology No results found.  Procedures Procedures (including critical care time)  Medications Ordered in UC Medications - No data to display   Initial Impression / Assessment and Plan / UC Course  I have reviewed the triage vital signs and the nursing notes.  Pertinent labs & imaging results that were available during my care of the patient were reviewed by me and considered in my medical decision making (see chart for details).  Clinical Course     Cousin the wounds that look like is completely closed will recommend waiting another 6 days but using back pain ointment twice daily wound. The wound doesn't look infected however he has been on Ceftin will continue with antibiotic ointment but no more antibiotic by mouth or systemic. Follow-up Monday evening after work. Or he can return Thursday. Where I can reevaluate him.  Final Clinical Impressions(s) / UC Diagnoses   Final diagnoses:  Suture check    New Prescriptions Discharge Medication List as of 09/05/2016  3:15 PM      Note: This dictation was prepared with Dragon dictation along with smaller phrase technology. Any transcriptional errors that result from this process are  unintentional.   Frederich Cha, MD 09/05/16 [phone]

## 2016-09-08 ENCOUNTER — Other Ambulatory Visit: Payer: Self-pay | Admitting: Internal Medicine

## 2016-09-14 ENCOUNTER — Ambulatory Visit
Admission: EM | Admit: 2016-09-14 | Discharge: 2016-09-14 | Disposition: A | Discharge status: Home / Self Care | Payer: BC Managed Care – PPO | Attending: Family Medicine | Admitting: Family Medicine

## 2016-09-14 DIAGNOSIS — S51811A Laceration without foreign body of right forearm, initial encounter: Secondary | ICD-10-CM

## 2016-09-14 DIAGNOSIS — Z4889 Encounter for other specified surgical aftercare: Secondary | ICD-10-CM

## 2016-09-14 MED ORDER — CLOBETASOL PROP EMOLLIENT BASE 0.05 % EX CREA: TOPICAL_CREAM | CUTANEOUS | 0 refills | Status: DC

## 2016-09-14 NOTE — ED Provider Notes (Signed)
[facility]    CSN: [id number] Arrival date & time: 09/14/16  1555     History   Chief Complaint Chief Complaint  Patient presents with  . Follow-up    Staple Removal     HPI Alex [name] is a 55 y.o. male.   Patient missed the Tuesday return visit is here today on Thursday. Still having some redness of the wrist. So happened was a nurse ran into him earlier this week and she states wasn't nearly as red when she ran into him at the jewelry store.   The history is provided by the patient. No language interpreter was used.  Wound Check  The current episode started more than 1 week ago. The problem occurs constantly. The problem has been gradually improving. Pertinent negatives include no chest pain, no abdominal pain, no headaches and no shortness of breath. Nothing aggravates the symptoms. He has tried nothing for the symptoms. The treatment provided moderate relief.    Past Medical History:  Diagnosis Date  . HTN (hypertension)   . OSA (obstructive sleep apnea)    PSG 09/23/10 AHI 22  . RBBB (right bundle branch block)     Patient Active Problem List   Diagnosis Date Noted  . Pain of right heel 09/01/2015  . Routine general medical examination at a health care facility 01/12/2014  . Obstructive sleep apnea 08/22/2010  . Essential hypertension, benign 01/03/2010    Past Surgical History:  Procedure Laterality Date  . APPENDECTOMY  1988  . fracture L arm    . TONSILLECTOMY    . VASECTOMY  1997        Home Medications    Prior to Admission medications   Medication Sig Start Date End Date Taking? Authorizing Provider  bisoprolol-hydrochlorothiazide (ZIAC) 2.5-6.25 MG tablet TAKE 1 TABLET BY MOUTH ONCE DAILY 07/11/16  Yes Venia Carbon, MD  sildenafil (REVATIO) 20 MG tablet TAKE 3 TO 5 TABLETS BY MOUTH ONCE DAILY AS NEEDED 09/08/16  Yes Venia Carbon, MD  Clobetasol Prop Emollient Base (CLOBETASOL PROPIONATE E) 0.05 % emollient cream  Apply once a day to twice a day as needed for inflammation 09/14/16   Frederich Cha, MD  mupirocin ointment (BACTROBAN) 2 % Apply 1 application topically 3 (three) times daily. 08/21/16   Frederich Cha, MD  tadalafil (CIALIS) 20 MG tablet Take 1 tablet (20 mg total) by mouth daily as needed for erectile dysfunction. 05/18/14   Venia Carbon, MD    Family History Family History  Problem Relation Age of Onset  . Stroke Father 62    doing okay  . Skin cancer Mother   . Stroke Paternal Grandfather     and PGGF  . Hypertension      dad's side   . Diabetes Neg Hx     DM   . Coronary artery disease Neg Hx   . Prostate cancer Neg Hx     no colon cancer   . Colon cancer Neg Hx     Social History Social History  Substance Use Topics  . Smoking status: Never Smoker  . Smokeless tobacco: Current User    Types: Chew     Comment: rare cigarillo   . Alcohol use Yes     Comment: once monthly on average     Allergies   Patient has no known allergies.   Review of Systems Review of Systems  Respiratory: Negative for shortness of breath.   Cardiovascular: Negative for chest  pain.  Gastrointestinal: Negative for abdominal pain.  Neurological: Negative for headaches.  All other systems reviewed and are negative.    Physical Exam Triage Vital Signs ED Triage Vitals  Enc Vitals Group     BP 09/14/16 1617 107/64     Pulse Rate 09/14/16 1617 78     Resp 09/14/16 1617 18     Temp 09/14/16 1617 98 F (36.7 C)     Temp Source 09/14/16 1617 Oral     SpO2 09/14/16 1617 96 %     Weight 09/14/16 1616 235 lb (106.6 kg)     Height 09/14/16 1616 6\' 1"  (1.854 m)     Head Circumference --      Peak Flow --      Pain Score 09/14/16 1618 1     Pain Loc --      Pain Edu? --      Excl. in [place]? --    No data found.   Updated Vital Signs BP 107/64 (BP Location: Left Arm)   Pulse 78   Temp 98 F (36.7 C) (Oral)   Resp 18   Ht 6\' 1"  (1.854 m)   Wt 235 lb (106.6 kg)   SpO2 96%   BMI  31.00 kg/m   Visual Acuity Right Eye Distance:   Left Eye Distance:   Bilateral Distance:    Right Eye Near:   Left Eye Near:    Bilateral Near:     Physical Exam  Constitutional: He appears well-developed and well-nourished.  HENT:  Head: Normocephalic and atraumatic.  Eyes: Pupils are equal, round, and reactive to light.  Neck: Normal range of motion.  Pulmonary/Chest: Effort normal.  Musculoskeletal: He exhibits tenderness.       Right wrist: He exhibits laceration.  Laceration has redness on both sides with status reaction to the staples staples be pulled at this time and will place him on a steroid cream.  Neurological: He is alert.  Skin: Skin is warm.  Psychiatric: He has a normal mood and affect.     UC Treatments / Results  Labs (all labs ordered are listed, but only abnormal results are displayed) Labs Reviewed - No data to display  EKG  EKG Interpretation None       Radiology No results found.  Procedures Procedures (including critical care time)  Medications Ordered in UC Medications - No data to display   Initial Impression / Assessment and Plan / UC Course  I have reviewed the triage vital signs and the nursing notes.  Pertinent labs & imaging results that were available during my care of the patient were reviewed by me and considered in my medical decision making (see chart for details).  Clinical Course    Staples pulled place on steroid cream, but still once or twice a day small amount recommended. Follow-up as needed with PCP or here  Final Clinical Impressions(s) / UC Diagnoses   Final diagnoses:  Encounter for postoperative wound check    New Prescriptions New Prescriptions   CLOBETASOL PROP EMOLLIENT BASE (CLOBETASOL PROPIONATE E) 0.05 % EMOLLIENT CREAM    Apply once a day to twice a day as needed for inflammation     Note: This dictation was prepared with Dragon dictation along with smaller phrase technology. Any  transcriptional errors that result from this process are unintentional.   Frederich Cha, MD 09/14/16 [phone]

## 2016-09-14 NOTE — ED Triage Notes (Signed)
Follow up on staples in right forearm

## 2016-09-14 NOTE — ED Notes (Signed)
Staples removed, cleansed with antiseptic cleaner and steri strips applied.

## 2016-09-28 ENCOUNTER — Ambulatory Visit (INDEPENDENT_AMBULATORY_CARE_PROVIDER_SITE_OTHER): Payer: BC Managed Care – PPO | Admitting: Internal Medicine

## 2016-09-28 ENCOUNTER — Encounter: Payer: Self-pay | Admitting: Internal Medicine

## 2016-09-28 VITALS — BP 142/90 | HR 79 | Temp 98.6°F | Ht 73.25 in | Wt 233.0 lb

## 2016-09-28 DIAGNOSIS — Z125 Encounter for screening for malignant neoplasm of prostate: Secondary | ICD-10-CM | POA: Diagnosis not present

## 2016-09-28 DIAGNOSIS — F439 Reaction to severe stress, unspecified: Secondary | ICD-10-CM | POA: Diagnosis not present

## 2016-09-28 DIAGNOSIS — Z0000 Encounter for general adult medical examination without abnormal findings: Secondary | ICD-10-CM | POA: Diagnosis not present

## 2016-09-28 DIAGNOSIS — G4733 Obstructive sleep apnea (adult) (pediatric): Secondary | ICD-10-CM | POA: Diagnosis not present

## 2016-09-28 DIAGNOSIS — I10 Essential (primary) hypertension: Secondary | ICD-10-CM | POA: Diagnosis not present

## 2016-09-28 MED ORDER — SILDENAFIL CITRATE 20 MG PO TABS: ORAL_TABLET | ORAL | 11 refills | Status: DC

## 2016-09-28 MED ORDER — BISOPROLOL-HYDROCHLOROTHIAZIDE 2.5-6.25 MG PO TABS: 1.0000 | ORAL_TABLET | Freq: Every day | ORAL | 3 refills | Status: DC

## 2016-09-28 NOTE — Assessment & Plan Note (Signed)
BP Readings from Last 3 Encounters:  09/28/16 (!) 142/90  09/14/16 107/64  09/05/16 115/79   Up today but stressed with work so will hold off on changes

## 2016-09-28 NOTE — Assessment & Plan Note (Signed)
Stressful job and not getting enough time off Advised him to get more time off---if they won't give it, may need to give some time off medically

## 2016-09-28 NOTE — Assessment & Plan Note (Signed)
Uses the CPAP with good results but machine not right He will check with company

## 2016-09-28 NOTE — Progress Notes (Signed)
Subjective:    Patient ID: Alex [name], male    DOB: 02/22/1961, 56 y.o.   MRN: [id number]  HPI Here for physical  Seen in ER in November--- right forearm laceration Now repaired Td updated  Has been doing well otherwise Still sleeps with CPAP---may need new machine Machine is supposed to auto titrate (had Rx for 10cm after sleep study) but doesn't seem to be working correctly  Doesn't check BP No symptoms Not working out still  Same job Now 12 hour shifts--is really draining Hopes to retire soon--and find something else to do  Current Outpatient Prescriptions on File Prior to Visit  Medication Sig Dispense Refill  . bisoprolol-hydrochlorothiazide (ZIAC) 2.5-6.25 MG tablet TAKE 1 TABLET BY MOUTH ONCE DAILY 90 tablet 0  . sildenafil (REVATIO) 20 MG tablet TAKE 3 TO 5 TABLETS BY MOUTH ONCE DAILY AS NEEDED 50 tablet 0   No current facility-administered medications on file prior to visit.     No Known Allergies  Past Medical History:  Diagnosis Date  . HTN (hypertension)   . OSA (obstructive sleep apnea)    PSG 09/23/10 AHI 22  . RBBB (right bundle branch block)     Past Surgical History:  Procedure Laterality Date  . APPENDECTOMY  1988  . fracture L arm    . TONSILLECTOMY    . VASECTOMY  1997     Family History  Problem Relation Age of Onset  . Stroke Father 41    doing okay  . Skin cancer Mother   . Stroke Paternal Grandfather     and PGGF  . Hypertension      dad's side   . Diabetes Neg Hx     DM   . Coronary artery disease Neg Hx   . Prostate cancer Neg Hx     no colon cancer   . Colon cancer Neg Hx     Social History   Social History  . Marital status: Married    Spouse name: N/A  . Number of children: 2  . Years of education: N/A   Occupational History  . Corrections officer    Social History Main Topics  . Smoking status: Never Smoker  . Smokeless tobacco: Current User    Types: Chew     Comment: rare cigarillo   . Alcohol  use Yes     Comment: once monthly on average  . Drug use: No  . Sexual activity: Not on file   Other Topics Concern  . Not on file   Social History Narrative   Married, 2 children.   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        Review of Systems  Constitutional:       Weight is down 9# Wears seat belt  HENT: Positive for hearing loss. Negative for dental problem and tinnitus.        Keeps up with dentist  Eyes: Positive for visual disturbance.       Mild fuzziness in vision  Respiratory: Negative for cough, chest tightness and shortness of breath.   Cardiovascular: Negative for chest pain, palpitations and leg swelling.  Gastrointestinal: Negative for blood in stool, constipation, nausea and vomiting.  Endocrine: Negative for polydipsia and polyuria.  Genitourinary: Negative for frequency and urgency.       Mild decreased stream Satisfied with sildenafil  Musculoskeletal: Negative for arthralgias, back pain and joint swelling.  Skin: Negative for rash.       Recent  skin cancer Keeps up with dermatologist  Allergic/Immunologic: Negative for environmental allergies and immunocompromised state.  Neurological: Negative for dizziness, syncope, light-headedness and headaches.  Hematological: Negative for adenopathy. Does not bruise/bleed easily.  Psychiatric/Behavioral: Negative for dysphoric mood and sleep disturbance.       "Ill" occasionally --mostly related to work Hasn't been taking time off---stressed now. Worries they will give him time off       Objective:   Physical Exam  Constitutional: He is oriented to person, place, and time. He appears well-developed and well-nourished. No distress.  HENT:  Head: Normocephalic and atraumatic.  Right Ear: External ear normal.  Left Ear: External ear normal.  Mouth/Throat: Oropharynx is clear and moist. No oropharyngeal exudate.  Eyes: Conjunctivae are normal. Pupils are equal, round, and reactive to light.  Neck: Normal  range of motion. Neck supple. No thyromegaly present.  Cardiovascular: Normal rate, regular rhythm, normal heart sounds and intact distal pulses.  Exam reveals no gallop.   No murmur heard. Pulmonary/Chest: Effort normal and breath sounds normal. No respiratory distress. He has no wheezes. He has no rales.  Abdominal: Soft. There is no tenderness.  Musculoskeletal: He exhibits no edema or tenderness.  Lymphadenopathy:    He has no cervical adenopathy.  Neurological: He is alert and oriented to person, place, and time.  Skin:  Numerous nevi  Psychiatric: He has a normal mood and affect. His behavior is normal.          Assessment & Plan:

## 2016-09-28 NOTE — Assessment & Plan Note (Signed)
Fairly healthy but discussed resuming exercise Colon due 2025 Will check PSA

## 2016-09-28 NOTE — Progress Notes (Signed)
Pre visit review using our clinic review tool, if applicable. No additional management support is needed unless otherwise documented below in the visit note. 

## 2016-09-29 LAB — CBC WITH DIFFERENTIAL/PLATELET
BASOS ABS: 0 10*3/uL (ref 0.0–0.1)
Basophils Relative: 0.1 % (ref 0.0–3.0)
EOS ABS: 0.5 10*3/uL (ref 0.0–0.7)
Eosinophils Relative: 5 % (ref 0.0–5.0)
HEMATOCRIT: 45.5 % (ref 39.0–52.0)
HEMOGLOBIN: 15.6 g/dL (ref 13.0–17.0)
Lymphocytes Relative: 27.3 % (ref 12.0–46.0)
Lymphs Abs: 2.8 10*3/uL (ref 0.7–4.0)
MCHC: 34.2 g/dL (ref 30.0–36.0)
MCV: 85.2 fl (ref 78.0–100.0)
Monocytes Absolute: 0.4 10*3/uL (ref 0.1–1.0)
Monocytes Relative: 4.2 % (ref 3.0–12.0)
Neutro Abs: 6.5 10*3/uL (ref 1.4–7.7)
Neutrophils Relative %: 63.4 % (ref 43.0–77.0)
PLATELETS: 224 10*3/uL (ref 150.0–400.0)
RBC: 5.34 Mil/uL (ref 4.22–5.81)
RDW: 12.8 % (ref 11.5–15.5)
WBC: 10.3 10*3/uL (ref 4.0–10.5)

## 2016-09-29 LAB — COMPREHENSIVE METABOLIC PANEL
ALBUMIN: 4.7 g/dL (ref 3.5–5.2)
ALK PHOS: 89 U/L (ref 39–117)
ALT: 25 U/L (ref 0–53)
AST: 20 U/L (ref 0–37)
BILIRUBIN TOTAL: 1.3 mg/dL — AB (ref 0.2–1.2)
BUN: 12 mg/dL (ref 6–23)
CALCIUM: 9.9 mg/dL (ref 8.4–10.5)
CO2: 28 mEq/L (ref 19–32)
CREATININE: 1.23 mg/dL (ref 0.40–1.50)
Chloride: 104 mEq/L (ref 96–112)
GFR: 65.16 mL/min (ref >60.00)
Glucose, Bld: 86 mg/dL (ref 70–99)
Potassium: 4.1 mEq/L (ref 3.5–5.1)
SODIUM: 140 meq/L (ref 135–145)
Total Protein: 7 g/dL (ref 6.0–8.3)

## 2016-09-29 LAB — PSA: PSA: 3.11 ng/mL (ref 0.10–4.00)

## 2016-11-22 ENCOUNTER — Encounter: Payer: Self-pay | Admitting: Internal Medicine

## 2016-11-22 ENCOUNTER — Ambulatory Visit (INDEPENDENT_AMBULATORY_CARE_PROVIDER_SITE_OTHER): Payer: BC Managed Care – PPO | Admitting: Internal Medicine

## 2016-11-22 VITALS — BP 130/82 | HR 71 | Temp 98.2°F | Wt 231.5 lb

## 2016-11-22 DIAGNOSIS — F439 Reaction to severe stress, unspecified: Secondary | ICD-10-CM

## 2016-11-22 NOTE — Assessment & Plan Note (Signed)
He has significant family responsibility in [place] and needs off 3/12 and 3/13. They won't give him off and he has to go. This is emotionally difficult for him. I have recommended that he take the time off--even if he needs to call in sick. This is very stressful for him and he may have trouble staying at his job if doesn't have this time off Will set up appt after he gets back--in case we need to justify his time off (for emotional and family needs)

## 2016-11-22 NOTE — Progress Notes (Signed)
   Subjective:    Patient ID: Alex [name], male    DOB: 13-Dec-1960, 56 y.o.   MRN: [id number]  HPI Here due to anxiety He has been stressed out at work and can't get time off He really needs to go to [place] to see grandchild  Does have the time to retire in November Has almost 2 years of sick time built up He wants March 12th and 13th off for his travel--he is at the end of his rope and really needs to see his grandchildren (there will be a birthday celebration)  His job is still very difficult  Current Outpatient Prescriptions on File Prior to Visit  Medication Sig Dispense Refill  . bisoprolol-hydrochlorothiazide (ZIAC) 2.5-6.25 MG tablet Take 1 tablet by mouth daily. 90 tablet 3  . sildenafil (REVATIO) 20 MG tablet TAKE 3 TO 5 TABLETS BY MOUTH ONCE DAILY AS NEEDED 50 tablet 11   No current facility-administered medications on file prior to visit.     No Known Allergies  Past Medical History:  Diagnosis Date  . HTN (hypertension)   . OSA (obstructive sleep apnea)    PSG 09/23/10 AHI 22  . RBBB (right bundle branch block)     Past Surgical History:  Procedure Laterality Date  . APPENDECTOMY  1988  . fracture L arm    . TONSILLECTOMY    . VASECTOMY  1997     Family History  Problem Relation Age of Onset  . Stroke Father 23    doing okay  . Skin cancer Mother   . Stroke Paternal Grandfather     and PGGF  . Hypertension      dad's side   . Diabetes Neg Hx     DM   . Coronary artery disease Neg Hx   . Prostate cancer Neg Hx     no colon cancer   . Colon cancer Neg Hx     Social History   Social History  . Marital status: Married    Spouse name: N/A  . Number of children: 2  . Years of education: N/A   Occupational History  . Corrections officer    Social History Main Topics  . Smoking status: Never Smoker  . Smokeless tobacco: Current User    Types: Chew     Comment: rare cigarillo   . Alcohol use Yes     Comment: once monthly on average  .  Drug use: No  . Sexual activity: Not on file   Other Topics Concern  . Not on file   Social History Narrative   Married, 2 children.   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        Review of Systems  Not really depressed but things get overwhelming at times Sleeps okay Appetite is fine     Objective:   Physical Exam  Psychiatric:  Mildly anxious          Assessment & Plan:

## 2016-11-22 NOTE — Progress Notes (Signed)
Pre visit review using our clinic review tool, if applicable. No additional management support is needed unless otherwise documented below in the visit note. 

## 2016-11-29 ENCOUNTER — Ambulatory Visit
Admission: EM | Admit: 2016-11-29 | Discharge: 2016-11-29 | Disposition: A | Discharge status: Home / Self Care | Payer: BC Managed Care – PPO | Attending: Family Medicine | Admitting: Family Medicine

## 2016-11-29 DIAGNOSIS — I10 Essential (primary) hypertension: Secondary | ICD-10-CM | POA: Diagnosis not present

## 2016-11-29 DIAGNOSIS — H1031 Unspecified acute conjunctivitis, right eye: Secondary | ICD-10-CM

## 2016-11-29 DIAGNOSIS — R030 Elevated blood-pressure reading, without diagnosis of hypertension: Secondary | ICD-10-CM

## 2016-11-29 MED ORDER — MOXIFLOXACIN HCL 0.5 % OP SOLN: 1.0000 [drp] | Freq: Three times a day (TID) | OPHTHALMIC | 0 refills | Status: DC

## 2016-11-29 NOTE — ED Notes (Signed)
Went to recheck patient's BP and patient was not in the exam room.  Patient was also not in either patient restrooms.

## 2016-11-29 NOTE — ED Provider Notes (Signed)
[facility]    CSN: [id number] Arrival date & time: 11/29/16  1841     History   Chief Complaint Chief Complaint  Patient presents with  . Eye Drainage    HPI Alex [name] is a 56 y.o. male.   56 yo male with a c/o right eye redness and drainage for 2 days. Denies any injury or trauma, fevers or chills.    The history is provided by the patient.    Past Medical History:  Diagnosis Date  . HTN (hypertension)   . OSA (obstructive sleep apnea)    PSG 09/23/10 AHI 22  . RBBB (right bundle branch block)     Patient Active Problem List   Diagnosis Date Noted  . Situational stress 09/28/2016  . Pain of right heel 09/01/2015  . Routine general medical examination at a health care facility 01/12/2014  . Obstructive sleep apnea 08/22/2010  . Essential hypertension, benign 01/03/2010    Past Surgical History:  Procedure Laterality Date  . APPENDECTOMY  1988  . fracture L arm    . TONSILLECTOMY    . VASECTOMY  1997        Home Medications    Prior to Admission medications   Medication Sig Start Date End Date Taking? Authorizing Provider  bisoprolol-hydrochlorothiazide (ZIAC) 2.5-6.25 MG tablet Take 1 tablet by mouth daily. 09/28/16  Yes Venia Carbon, MD  sildenafil (REVATIO) 20 MG tablet TAKE 3 TO 5 TABLETS BY MOUTH ONCE DAILY AS NEEDED 09/28/16  Yes Venia Carbon, MD  moxifloxacin (VIGAMOX) 0.5 % ophthalmic solution Place 1 drop into the right eye 3 (three) times daily. 11/29/16   Norval Gable, MD    Family History Family History  Problem Relation Age of Onset  . Stroke Father 75    doing okay  . Skin cancer Mother   . Stroke Paternal Grandfather     and PGGF  . Hypertension      dad's side   . Diabetes Neg Hx     DM   . Coronary artery disease Neg Hx   . Prostate cancer Neg Hx     no colon cancer   . Colon cancer Neg Hx     Social History Social History  Substance Use Topics  . Smoking status: Never Smoker  . Smokeless  tobacco: Current User    Types: Chew     Comment: rare cigarillo   . Alcohol use Yes     Comment: once monthly on average     Allergies   Patient has no known allergies.   Review of Systems Review of Systems   Physical Exam Triage Vital Signs ED Triage Vitals  Enc Vitals Group     BP 11/29/16 2016 (!) 181/114     Pulse Rate 11/29/16 2016 67     Resp 11/29/16 2016 18     Temp 11/29/16 2016 98.4 F (36.9 C)     Temp Source 11/29/16 2016 Oral     SpO2 11/29/16 2016 99 %     Weight 11/29/16 2015 231 lb (104.8 kg)     Height --      Head Circumference --      Peak Flow --      Pain Score 11/29/16 2016 5     Pain Loc --      Pain Edu? --      Excl. in [place]? --    No data found.   Updated Vital Signs BP Marland Kitchen)  181/114 (BP Location: Left Arm)   Pulse 67   Temp 98.4 F (36.9 C) (Oral)   Resp 18   Wt 231 lb (104.8 kg)   SpO2 99%   BMI 30.27 kg/m   Visual Acuity Right Eye Distance: 20/70 (uncorrected) Left Eye Distance: 20/100 (uncorrected) Bilateral Distance: 20/50 (uncorrected)  Right Eye Near:   Left Eye Near:    Bilateral Near:     Physical Exam  Constitutional: He appears well-developed and well-nourished. No distress.  Eyes: EOM and lids are normal. Pupils are equal, round, and reactive to light. Right eye exhibits discharge. Left eye exhibits no discharge. Right conjunctiva is injected. Left conjunctiva is not injected.  Pulmonary/Chest: Effort normal. No respiratory distress. He has no wheezes. He has no rales.  Skin: He is not diaphoretic.  Nursing note and vitals reviewed.    UC Treatments / Results  Labs (all labs ordered are listed, but only abnormal results are displayed) Labs Reviewed - No data to display  EKG  EKG Interpretation None       Radiology No results found.  Procedures Procedures (including critical care time)  Medications Ordered in UC Medications - No data to display   Initial Impression / Assessment and Plan / UC  Course  I have reviewed the triage vital signs and the nursing notes.  Pertinent labs & imaging results that were available during my care of the patient were reviewed by me and considered in my medical decision making (see chart for details).       Final Clinical Impressions(s) / UC Diagnoses   Final diagnoses:  Acute bacterial conjunctivitis of right eye  Elevated blood pressure reading  Essential hypertension    New Prescriptions Discharge Medication List as of 11/29/2016  8:46 PM    START taking these medications   Details  moxifloxacin (VIGAMOX) 0.5 % ophthalmic solution Place 1 drop into the right eye 3 (three) times daily., Starting Wed 11/29/2016, Normal       1. diagnosis reviewed with patient 2. rx as per orders above; reviewed possible side effects, interactions, risks and benefits  3. Recommend supportive treatment with cool compresses 4. Informed patient of elevated blood pressure and to follow up with PCP this week; requested manual blood pressure recheck, however patient left prior to recheck of his blood pressure    Norval Gable, MD 11/29/16 2117

## 2016-11-29 NOTE — ED Triage Notes (Signed)
Patient complains of right eye drainage and redness. Patient states that symptoms started 2 days ago. Patient states that his eye felt scratchy earlier today.

## 2016-12-07 ENCOUNTER — Encounter: Payer: Self-pay | Admitting: Internal Medicine

## 2016-12-07 ENCOUNTER — Ambulatory Visit (INDEPENDENT_AMBULATORY_CARE_PROVIDER_SITE_OTHER): Payer: BC Managed Care – PPO | Admitting: Internal Medicine

## 2016-12-07 VITALS — BP 142/84 | HR 86 | Temp 98.6°F | Wt 237.5 lb

## 2016-12-07 DIAGNOSIS — F439 Reaction to severe stress, unspecified: Secondary | ICD-10-CM

## 2016-12-07 NOTE — Progress Notes (Signed)
Pre visit review using our clinic review tool, if applicable. No additional management support is needed unless otherwise documented below in the visit note. 

## 2016-12-07 NOTE — Assessment & Plan Note (Signed)
Some better with the time off--okay to go back to work now BP up very high ---but way down after our visit Likely related to the stress of his job so will hold off on any changes Note for work

## 2016-12-07 NOTE — Progress Notes (Signed)
   Subjective:    Patient ID: CLESTER CHLEBOWSKI, male    DOB: 1961-03-27, 56 y.o.   MRN: [id number]  HPI Here for follow up of situational stress  Was in [place] as planned Had to miss work as we discussed---needs work  Not sure what he is going to do about work Can retire with full benefits by later this year--and will probably do this  Current Outpatient Prescriptions on File Prior to Visit  Medication Sig Dispense Refill  . bisoprolol-hydrochlorothiazide (ZIAC) 2.5-6.25 MG tablet Take 1 tablet by mouth daily. 90 tablet 3  . moxifloxacin (VIGAMOX) 0.5 % ophthalmic solution Place 1 drop into the right eye 3 (three) times daily. 3 mL 0  . sildenafil (REVATIO) 20 MG tablet TAKE 3 TO 5 TABLETS BY MOUTH ONCE DAILY AS NEEDED 50 tablet 11   No current facility-administered medications on file prior to visit.     No Known Allergies  Past Medical History:  Diagnosis Date  . HTN (hypertension)   . OSA (obstructive sleep apnea)    PSG 09/23/10 AHI 22  . RBBB (right bundle branch block)     Past Surgical History:  Procedure Laterality Date  . APPENDECTOMY  1988  . fracture L arm    . TONSILLECTOMY    . VASECTOMY  1997     Family History  Problem Relation Age of Onset  . Stroke Father 51    doing okay  . Skin cancer Mother   . Stroke Paternal Grandfather     and PGGF  . Hypertension      dad's side   . Diabetes Neg Hx     DM   . Coronary artery disease Neg Hx   . Prostate cancer Neg Hx     no colon cancer   . Colon cancer Neg Hx     Social History   Social History  . Marital status: Married    Spouse name: N/A  . Number of children: 2  . Years of education: N/A   Occupational History  . Corrections officer    Social History Main Topics  . Smoking status: Never Smoker  . Smokeless tobacco: Current User    Types: Chew     Comment: rare cigarillo   . Alcohol use Yes     Comment: once monthly on average  . Drug use: No  . Sexual activity: Not on file    Other Topics Concern  . Not on file   Social History Narrative   Married, 2 children.   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        Review of Systems Sleeping okay Appetite is okay--gained some weight while travelling Planning to join gym and start working out again    Objective:   Physical Exam  Psychiatric:  Calm now Good insight, etc          Assessment & Plan:

## 2017-09-04 ENCOUNTER — Other Ambulatory Visit: Payer: Self-pay | Admitting: Internal Medicine

## 2017-10-03 ENCOUNTER — Ambulatory Visit (INDEPENDENT_AMBULATORY_CARE_PROVIDER_SITE_OTHER): Payer: BC Managed Care – PPO | Admitting: Internal Medicine

## 2017-10-03 ENCOUNTER — Encounter: Payer: Self-pay | Admitting: *Deleted

## 2017-10-03 ENCOUNTER — Encounter: Payer: Self-pay | Admitting: Internal Medicine

## 2017-10-03 VITALS — BP 136/80 | HR 62 | Temp 97.9°F | Ht 73.0 in | Wt 232.0 lb

## 2017-10-03 DIAGNOSIS — Z23 Encounter for immunization: Secondary | ICD-10-CM

## 2017-10-03 DIAGNOSIS — G4733 Obstructive sleep apnea (adult) (pediatric): Secondary | ICD-10-CM

## 2017-10-03 DIAGNOSIS — Z0000 Encounter for general adult medical examination without abnormal findings: Secondary | ICD-10-CM | POA: Diagnosis not present

## 2017-10-03 DIAGNOSIS — I10 Essential (primary) hypertension: Secondary | ICD-10-CM | POA: Diagnosis not present

## 2017-10-03 LAB — COMPREHENSIVE METABOLIC PANEL
ALT: 19 U/L (ref 0–53)
AST: 17 U/L (ref 0–37)
Albumin: 4.5 g/dL (ref 3.5–5.2)
Alkaline Phosphatase: 84 U/L (ref 39–117)
BILIRUBIN TOTAL: 1.1 mg/dL (ref 0.2–1.2)
BUN: 13 mg/dL (ref 6–23)
CALCIUM: 9.4 mg/dL (ref 8.4–10.5)
CHLORIDE: 104 meq/L (ref 96–112)
CO2: 31 meq/L (ref 19–32)
CREATININE: 1.05 mg/dL (ref 0.40–1.50)
GFR: 77.92 mL/min (ref >60.00)
Glucose, Bld: 105 mg/dL — ABNORMAL HIGH (ref 70–99)
Potassium: 4.5 mEq/L (ref 3.5–5.1)
SODIUM: 140 meq/L (ref 135–145)
Total Protein: 6.9 g/dL (ref 6.0–8.3)

## 2017-10-03 LAB — CBC
HEMATOCRIT: 45.1 % (ref 39.0–52.0)
Hemoglobin: 14.9 g/dL (ref 13.0–17.0)
MCHC: 33.1 g/dL (ref 30.0–36.0)
MCV: 87.5 fl (ref 78.0–100.0)
Platelets: 233 10*3/uL (ref 150.0–400.0)
RBC: 5.15 Mil/uL (ref 4.22–5.81)
RDW: 12.7 % (ref 11.5–15.5)
WBC: 6.9 10*3/uL (ref 4.0–10.5)

## 2017-10-03 NOTE — Assessment & Plan Note (Signed)
Healthy Feels great since retiring Discussed exercise Defer PSA till next year Colon due 2025

## 2017-10-03 NOTE — Progress Notes (Signed)
Subjective:    Patient ID: Alex [name], male    DOB: January 05, 1961, 57 y.o.   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 bisoprolol-hydrochlorothiazide (ZIAC) 2.5-6.25 MG tablet TAKE 1 TABLET BY MOUTH ONCE DAILY 90 tablet 0  . moxifloxacin (VIGAMOX) 0.5 % ophthalmic solution Place 1 drop into the right eye 3 (three) times daily. 3 mL 0  . sildenafil (REVATIO) 20 MG tablet TAKE 3 TO 5 TABLETS BY MOUTH ONCE DAILY AS NEEDED 50 tablet 11   No current facility-administered medications on file prior to visit.     No Known Allergies  Past Medical History:  Diagnosis Date  . HTN (hypertension)   . OSA (obstructive sleep apnea)    PSG 09/23/10 AHI 22  . RBBB (right bundle branch block)     Past Surgical History:  Procedure Laterality Date  . APPENDECTOMY  1988  . fracture L arm    . TONSILLECTOMY    . VASECTOMY  1997     Family History  Problem Relation Age of Onset  . Stroke Father 66       doing okay  . Skin cancer Mother   . Stroke Paternal Grandfather        and PGGF  . Hypertension Unknown        dad's side   . Diabetes Neg Hx        DM   . Coronary artery disease Neg Hx   . Prostate cancer Neg Hx        no colon cancer   . Colon cancer Neg Hx     Social History   Socioeconomic History  . Marital status: Married    Spouse name: Not on file  . Number of children: 2  . Years of education: Not on file  . Highest education level: Not on file  Social Needs  . Financial  resource strain: Not on file  . Food insecurity - worry: Not on file  . Food insecurity - inability: Not on file  . Transportation needs - medical: Not on file  . Transportation needs - non-medical: Not on file  Occupational History  . 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 Smoking status: Never Smoker  . Smokeless tobacco: Current User    Types: Chew  . Tobacco comment: rare cigarillo   Substance and Sexual Activity  . Alcohol use: Yes    Comment: once monthly on average  . Drug use: No  . Sexual activity: Not on file  Other Topics Concern  . Not on file  Social History Narrative   Married, 2 children.   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        Review of Systems  Constitutional: Negative for fatigue and unexpected weight change.       Wears seat belt  HENT: Positive for tinnitus. Negative for hearing loss.        Needs wisdom teeth out--- planning  some time  Eyes: Negative for visual disturbance.       No diplopia or unilateral vision loss  Respiratory: Negative for cough, chest tightness and shortness of breath.   Cardiovascular: Negative for chest pain, palpitations and leg swelling.  Gastrointestinal: Negative for abdominal pain, blood in stool and constipation.  Endocrine: Negative for polydipsia and polyuria.  Genitourinary: Negative for frequency and urgency.       Mild slow start with voiding  Musculoskeletal: Negative for arthralgias and joint swelling.       Occ mild back pain  Skin: Negative for rash.  Allergic/Immunologic: Negative for environmental allergies and immunocompromised state.  Neurological: Negative for dizziness, syncope, light-headedness and headaches.  Hematological: Negative for adenopathy. Does not bruise/bleed easily.  Psychiatric/Behavioral: Negative for dysphoric mood. The patient is not nervous/anxious.        Objective:   Physical Exam  Constitutional: He is oriented to person, place, and  time. He appears well-developed and well-nourished. No distress.  HENT:  Head: Normocephalic and atraumatic.  Right Ear: External ear normal.  Left Ear: External ear normal.  Mouth/Throat: Oropharynx is clear and moist. No oropharyngeal exudate.  Eyes: Conjunctivae are normal. Pupils are equal, round, and reactive to light.  Neck: Normal range of motion. No thyromegaly present.  Cardiovascular: Normal rate, regular rhythm, normal heart sounds and intact distal pulses. Exam reveals no gallop.  No murmur heard. Pulmonary/Chest: Effort normal and breath sounds normal. No respiratory distress. He has no wheezes. He has no rales.  Abdominal: Soft. He exhibits no distension. There is no tenderness.  Musculoskeletal: He exhibits no edema or tenderness.  Lymphadenopathy:    He has no cervical adenopathy.  Neurological: He is alert and oriented to person, place, and time.  Skin: No rash noted. No erythema.  Multiple nevi (keeps up with dermatologist)          Assessment & Plan:

## 2017-10-03 NOTE — Assessment & Plan Note (Signed)
Needs to have machine checked

## 2017-10-03 NOTE — Assessment & Plan Note (Signed)
BP Readings from Last 3 Encounters:  10/03/17 136/80  12/07/16 (!) 142/84  11/29/16 (!) 181/114   Good control now No changes

## 2017-10-08 ENCOUNTER — Other Ambulatory Visit: Payer: Self-pay | Admitting: Internal Medicine

## 2018-02-07 ENCOUNTER — Other Ambulatory Visit: Payer: Self-pay | Admitting: Internal Medicine

## 2018-07-25 ENCOUNTER — Other Ambulatory Visit: Payer: Self-pay | Admitting: Internal Medicine

## 2018-10-02 ENCOUNTER — Ambulatory Visit: Payer: BC Managed Care – PPO | Admitting: Family Medicine

## 2018-10-02 ENCOUNTER — Telehealth: Payer: Self-pay | Admitting: Internal Medicine

## 2018-10-02 ENCOUNTER — Ambulatory Visit (INDEPENDENT_AMBULATORY_CARE_PROVIDER_SITE_OTHER)
Admission: RE | Admit: 2018-10-02 | Discharge: 2018-10-02 | Disposition: A | Discharge status: Home / Self Care | Payer: BC Managed Care – PPO | Source: Ambulatory Visit | Attending: Family Medicine | Admitting: Family Medicine

## 2018-10-02 ENCOUNTER — Encounter: Payer: Self-pay | Admitting: Family Medicine

## 2018-10-02 VITALS — BP 130/78 | HR 60 | Temp 98.4°F | Ht 73.0 in | Wt 228.5 lb

## 2018-10-02 DIAGNOSIS — Z23 Encounter for immunization: Secondary | ICD-10-CM | POA: Diagnosis not present

## 2018-10-02 DIAGNOSIS — M79645 Pain in left finger(s): Secondary | ICD-10-CM | POA: Diagnosis not present

## 2018-10-02 DIAGNOSIS — S62653A Nondisplaced fracture of medial phalanx of left middle finger, initial encounter for closed fracture: Secondary | ICD-10-CM

## 2018-10-02 NOTE — Progress Notes (Signed)
Subjective:    Patient ID: Alex [name], male    DOB: 1961/04/21, 58 y.o.   MRN: [id number]  HPI 58 yo pt of Dr Silvio Pate her with L middle finger injury   This occurred 11 days ago   Was hunting  He fell forward and middle finger got jammed  Middle joint pain  Swelling immediate Also black and blue   Not numb but occasionally tingles  No hand pain  Hurts most to flex/ make a fist  Some discomfort more distally to extend   His grip is limited by pain   He is R handed   Tried aleve otc and tylenol - just a few times (helped a bit)  No ice   Xray today  Dg Finger Middle Left  Result Date: 10/02/2018 CLINICAL DATA:  Jammed left middle finger, pain/swelling EXAM: LEFT MIDDLE FINGER 2+V COMPARISON:  None. FINDINGS: Suspected nondisplaced fracture at the volar base of the 3rd middle phalanx, best visualized on the lateral view. The joint spaces are preserved. Mild soft tissue swelling centered along the PIP joint. IMPRESSION: Suspected nondisplaced volar plate avulsion fracture at the base of the 3rd middle phalanx. Electronically Signed   By: Julian Hy M.D.   On: 10/02/2018 16:15   Patient Active Problem List   Diagnosis Date Noted  . Pain of left middle finger 10/02/2018  . Closed nondisplaced fracture of middle phalanx of left middle finger 10/02/2018  . Routine general medical examination at a health care facility 01/12/2014  . Obstructive sleep apnea 08/22/2010  . Essential hypertension, benign 01/03/2010   Past Medical History:  Diagnosis Date  . HTN (hypertension)   . OSA (obstructive sleep apnea)    PSG 09/23/10 AHI 22  . RBBB (right bundle branch block)    Past Surgical History:  Procedure Laterality Date  . APPENDECTOMY  1988  . fracture L arm    . TONSILLECTOMY    . VASECTOMY  1997    Social History   Tobacco Use  . Smoking status: Never Smoker  . Smokeless tobacco: Current User    Types: Chew  . Tobacco comment: rare cigarillo   Substance  Use Topics  . Alcohol use: Yes    Comment: once monthly on average  . Drug use: No   Family History  Problem Relation Age of Onset  . Stroke Father 51       doing okay  . Skin cancer Mother   . Stroke Paternal Grandfather        and PGGF  . Hypertension Unknown        dad's side   . Diabetes Neg Hx        DM   . Coronary artery disease Neg Hx   . Prostate cancer Neg Hx        no colon cancer   . Colon cancer Neg Hx    No Known Allergies Current Outpatient Medications on File Prior to Visit  Medication Sig Dispense Refill  . bisoprolol-hydrochlorothiazide (ZIAC) 2.5-6.25 MG tablet TAKE 1 TABLET BY MOUTH ONCE DAILY 90 tablet 0  . sildenafil (REVATIO) 20 MG tablet TAKE 3 TO 5 TABLETS BY MOUTH ONCE DAILY AS NEEDED 50 tablet 11   No current facility-administered medications on file prior to visit.      Review of Systems  Constitutional: Negative for activity change, appetite change, fatigue, fever and unexpected weight change.  HENT: Negative for congestion, rhinorrhea, sore throat and trouble swallowing.   Eyes: Negative  for pain, redness, itching and visual disturbance.  Respiratory: Negative for cough, chest tightness, shortness of breath and wheezing.   Cardiovascular: Negative for chest pain and palpitations.  Gastrointestinal: Negative for abdominal pain, blood in stool, constipation, diarrhea and nausea.  Endocrine: Negative for cold intolerance, heat intolerance, polydipsia and polyuria.  Genitourinary: Negative for difficulty urinating, dysuria, frequency and urgency.  Musculoskeletal: Negative for arthralgias, joint swelling and myalgias.       L finger injury  Skin: Negative for pallor and rash.  Neurological: Negative for dizziness, tremors, weakness, numbness and headaches.  Hematological: Negative for adenopathy. Does not bruise/bleed easily.  Psychiatric/Behavioral: Negative for decreased concentration and dysphoric mood. The patient is not nervous/anxious.         Objective:   Physical Exam Constitutional:      General: He is not in acute distress.    Appearance: Normal appearance.  HENT:     Head: Normocephalic and atraumatic.  Cardiovascular:     Rate and Rhythm: Normal rate and regular rhythm.  Musculoskeletal:     Left hand: He exhibits decreased range of motion, tenderness, bony tenderness and swelling. He exhibits normal two-point discrimination, normal capillary refill, no deformity and no laceration.     Comments: L middle finger Swelling diffusely Tender over middle phalanx and joint w/o crepitus or step off  Unable to flex finger fully/in fist due to pain  Nl extension   Nl sensation and perfusion   Skin:    General: Skin is warm and dry.     Coloration: Skin is not pale.     Findings: No bruising or erythema.  Neurological:     General: No focal deficit present.     Mental Status: He is alert.     Sensory: No sensory deficit.     Motor: No weakness.     Coordination: Coordination normal.     Deep Tendon Reflexes: Reflexes normal.  Psychiatric:        Mood and Affect: Mood normal.           Assessment & Plan:   Problem List Items Addressed This Visit      Musculoskeletal and Integument   Closed nondisplaced fracture of middle phalanx of left middle finger    Volar plate avulsion fracture at the base of the 3rd middle phalanx   Splint given  otc nsaid and /or tylenol for pain if needed  Ice /elevation when able  Ref to sport med        Other   Pain of left middle finger - Primary   Relevant Orders   DG Finger Middle Left (Completed)    Other Visit Diagnoses    Need for influenza vaccination       Relevant Orders   Flu Vaccine QUAD 6+ mos PF IM (Fluarix Quad PF) (Completed)

## 2018-10-02 NOTE — Patient Instructions (Addendum)
You have a finger fracture Wear the splint - let us know if it is uncomfortable   Tylenol or ibuprofen for pain  Ice/cold compress will help swelling   We will set you up with a sport medicine doctor to check this   Stop up front -regarding this   If symptoms worsen - let us know

## 2018-10-02 NOTE — Assessment & Plan Note (Signed)
Volar plate avulsion fracture at the base of the 3rd middle phalanx   Splint given  otc nsaid and /or tylenol for pain if needed  Ice /elevation when able  Ref to sport med

## 2018-10-02 NOTE — Telephone Encounter (Signed)
Robin  Pt has a finger fracture (left middle volar plate avulsion fracture)  Can Dr Lorelei Pont see him? -let me know   Message from Dr. Glori Bickers in patient's AVS

## 2018-10-03 NOTE — Telephone Encounter (Signed)
Left message for Alex [name] that he needs to wear his finger splint at all times until he follows up with Dr. Lorelei Pont on 10/09/2018.

## 2018-10-03 NOTE — Telephone Encounter (Signed)
Left message asking pt to call office I schedule pt appointment for [facility] 10/09/18 @ 9:40 with Dr Lorelei Pont with arrival time of 9:30.    Please confirm with pt appointment date and time  Please let me know if pt confirmed appointment  thanks

## 2018-10-03 NOTE — Telephone Encounter (Signed)
That is fine - I would prefer that length of time in this case.   Case and films reviewed.   Butch Penny, can you make sure he understands to wear his splint at all times and I will see him next week.

## 2018-10-03 NOTE — Telephone Encounter (Signed)
Alex [name] Is it ok for pt to wait till wed 1/15 to see dr copland for this?

## 2018-10-04 NOTE — Telephone Encounter (Signed)
Left message on cell for pt to call office  Tried home number busy signal

## 2018-10-06 NOTE — Progress Notes (Signed)
Dr. Frederico Hamman T. Dinesha Twiggs, MD, [facility] [place], [address] Phone: [phone] Fax: [phone]  10/09/2018  Patient: Alex [name], MRN: [id number], DOB: 16-Jun-1961, 58 y.o.  Primary Physician:  Venia Carbon, MD   Chief Complaint  Patient presents with  . Follow-up    Finger Fx   Subjective:   Alex [name] is a 58 y.o. very pleasant male patient who presents with the following:  Pleasant gentleman who sustained a injury to his left third digit when he fell while he was hunting approximately 18 days ago.  He jammed his third digit and had some pain immediately at the PIP joint.  There was initial swelling as well as ecchymosis.  He has not had any numbness or tingling and flexion and extension have been preserved.  He was seen by my partner last week, and plain films did show a small nondisplaced fracture at the volar aspect of the third digit. This is at the PIP joint.   He has been immobilized in a aluminum form splint since October 02, 2018, and he is here to follow-up with me today for recheck.  He is doing really well and he has minimal pain right now.  Does have some swelling at the PIP joint at the middle finger and he has a little bit of stiffness after wearing a splint.  Past Medical History, Surgical History, Social History, Family History, Problem List, Medications, and Allergies have been reviewed and updated if relevant.  Patient Active Problem List   Diagnosis Date Noted  . Pain of left middle finger 10/02/2018  . Closed nondisplaced fracture of middle phalanx of left middle finger 10/02/2018  . Routine general medical examination at a health care facility 01/12/2014  . Obstructive sleep apnea 08/22/2010  . Essential hypertension, benign 01/03/2010    Past Medical History:  Diagnosis Date  . HTN (hypertension)   . OSA (obstructive sleep apnea)    PSG 09/23/10 AHI 22  . RBBB (right  bundle branch block)     Past Surgical History:  Procedure Laterality Date  . APPENDECTOMY  1988  . fracture L arm    . TONSILLECTOMY    . VASECTOMY  1997     Social History   Socioeconomic History  . Marital status: Married    Spouse name: Not on file  . Number of children: 2  . Years of education: Not on file  . Highest education level: Not on file  Occupational History  . 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 Financial resource strain: Not on file  . Food insecurity:    Worry: Not on file    Inability: Not on file  . Transportation needs:    Medical: Not on file    Non-medical: Not on file  Tobacco Use  . Smoking status: Never Smoker  . Smokeless tobacco: Former Systems developer    Types: Chew  . Tobacco comment: rare cigarillo   Substance and Sexual Activity  . Alcohol use: Yes    Comment: once monthly on average  . Drug use: No  . Sexual activity: Not on file  Lifestyle  . Physical activity:    Days per week: Not on file    Minutes per session: Not on file  . Stress: Not on file  Relationships  . Social connections:    Talks on phone: Not on file    Gets  together: Not on file    Attends religious service: Not on file    Active member of club or organization: Not on file    Attends meetings of clubs or organizations: Not on file    Relationship status: Not on file  . Intimate partner violence:    Fear of current or ex partner: Not on file    Emotionally abused: Not on file    Physically abused: Not on file    Forced sexual activity: Not on file  Other Topics Concern  . Not on file  Social History Narrative   Married, 2 children.   [employer] baseball.         Family History  Problem Relation Age of Onset  . Stroke Father 74       doing okay  . Skin cancer Mother   . Stroke Paternal Grandfather        and PGGF  . Hypertension Unknown        dad's side   . Diabetes Neg Hx        DM   . Coronary artery disease Neg Hx     . Prostate cancer Neg Hx        no colon cancer   . Colon cancer Neg Hx     No Known Allergies  Medication list reviewed and updated in full in [facility].  GEN: No fevers, chills. Nontoxic. Primarily MSK c/o today. MSK: Detailed in the HPI GI: tolerating PO intake without difficulty Neuro: No numbness, parasthesias, or tingling associated. Otherwise the pertinent positives of the ROS are noted above.   Objective:   BP 130/86   Pulse 61   Temp 98.5 F (36.9 C) (Oral)   Ht 6\' 1"  (1.854 m)   Wt 218 lb 4 oz (99 kg)   BMI 28.79 kg/m    GEN: WDWN, NAD, Non-toxic, Alert & Oriented x 3 HEENT: Atraumatic, Normocephalic.  Ears and Nose: No external deformity. EXTR: No clubbing/cyanosis/edema NEURO: Normal gait.  PSYCH: Normally interactive. Conversant. Not depressed or anxious appearing.  Calm demeanor.    Does have some mild swelling at the PIP, left digit middle finger.  There is some mild restriction of motion at the PIP as well as the DIP joint.  No significant tenderness to palpation there is no ecchymosis.  Range of motion improves even in the office after some basic range of motion teaching.  Radiology: Dg Finger Middle Left  Result Date: 10/02/2018 CLINICAL DATA:  Jammed left middle finger, pain/swelling EXAM: LEFT MIDDLE FINGER 2+V COMPARISON:  None. FINDINGS: Suspected nondisplaced fracture at the volar base of the 3rd middle phalanx, best visualized on the lateral view. The joint spaces are preserved. Mild soft tissue swelling centered along the PIP joint. IMPRESSION: Suspected nondisplaced volar plate avulsion fracture at the base of the 3rd middle phalanx. Electronically Signed   By: Julian Hy M.D.   On: 10/02/2018 16:15    Assessment and Plan:   Closed nondisplaced fracture of middle phalanx of left middle finger, initial encounter  Volar plate injury left third digit with small avulsion fracture.  Clinically healed at this point.  Discontinue  splinting.  No further immobilization with buddy taping only with vigorous exercise or hard yard work.  Active range of motion reviewed in detail and do this throughout the day as much as possible.  If return of pain then ibuprofen and ice would be reasonable.  If he still having some stiffness in a few  weeks, then I will refer him to hand therapy.  Follow-up: prn only  Signed,  Alex Pellicane T. Ori Trejos, MD   Outpatient Encounter Medications as of 10/09/2018  Medication Sig  . bisoprolol-hydrochlorothiazide (ZIAC) 2.5-6.25 MG tablet TAKE 1 TABLET BY MOUTH ONCE DAILY  . sildenafil (REVATIO) 20 MG tablet TAKE 3 TO 5 TABLETS BY MOUTH ONCE DAILY AS NEEDED   No facility-administered encounter medications on file as of 10/09/2018.

## 2018-10-07 NOTE — Telephone Encounter (Signed)
Pt aware of appointment 

## 2018-10-09 ENCOUNTER — Ambulatory Visit (INDEPENDENT_AMBULATORY_CARE_PROVIDER_SITE_OTHER): Payer: BC Managed Care – PPO | Admitting: Family Medicine

## 2018-10-09 ENCOUNTER — Encounter: Payer: Self-pay | Admitting: Family Medicine

## 2018-10-09 VITALS — BP 130/86 | HR 61 | Temp 98.5°F | Ht 73.0 in | Wt 218.2 lb

## 2018-10-09 DIAGNOSIS — S62653A Nondisplaced fracture of medial phalanx of left middle finger, initial encounter for closed fracture: Secondary | ICD-10-CM

## 2018-10-11 ENCOUNTER — Encounter: Payer: BC Managed Care – PPO | Admitting: Internal Medicine

## 2018-10-11 DIAGNOSIS — Z0289 Encounter for other administrative examinations: Secondary | ICD-10-CM

## 2018-11-15 ENCOUNTER — Other Ambulatory Visit: Payer: Self-pay | Admitting: Internal Medicine

## 2019-01-07 ENCOUNTER — Other Ambulatory Visit: Payer: Self-pay | Admitting: Internal Medicine

## 2019-01-07 NOTE — Telephone Encounter (Signed)
Pt needs VV for  HTN

## 2019-01-08 NOTE — Telephone Encounter (Signed)
Yes--go ahead and set up a virtual visit

## 2019-01-30 ENCOUNTER — Telehealth: Payer: Self-pay | Admitting: Internal Medicine

## 2019-01-30 NOTE — Telephone Encounter (Signed)
I left a message on patient's voice mail to return my call.  Patient needs to change his 02/06/19 cpx to [email or web address].

## 2019-02-06 ENCOUNTER — Encounter: Payer: Self-pay | Admitting: Internal Medicine

## 2019-02-06 ENCOUNTER — Ambulatory Visit (INDEPENDENT_AMBULATORY_CARE_PROVIDER_SITE_OTHER): Payer: BC Managed Care – PPO | Admitting: Internal Medicine

## 2019-02-06 VITALS — Ht 73.0 in | Wt 234.0 lb

## 2019-02-06 DIAGNOSIS — G4733 Obstructive sleep apnea (adult) (pediatric): Secondary | ICD-10-CM | POA: Diagnosis not present

## 2019-02-06 DIAGNOSIS — Z125 Encounter for screening for malignant neoplasm of prostate: Secondary | ICD-10-CM

## 2019-02-06 DIAGNOSIS — I10 Essential (primary) hypertension: Secondary | ICD-10-CM | POA: Diagnosis not present

## 2019-02-06 DIAGNOSIS — Z0000 Encounter for general adult medical examination without abnormal findings: Secondary | ICD-10-CM | POA: Diagnosis not present

## 2019-02-06 NOTE — Assessment & Plan Note (Signed)
Healthy but has let himself go Discussed proper eating and increasing physical activity Colon due 2025 Discussed PSA--will check Flu vaccine in the fall

## 2019-02-06 NOTE — Progress Notes (Signed)
Subjective:    Patient ID: Alex [name], male    DOB: March 05, 1961, 58 y.o.   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 bisoprolol-hydrochlorothiazide (ZIAC) 2.5-6.25 MG tablet TAKE 1 TABLET BY MOUTH ONCE DAILY 90 tablet 0  . sildenafil (REVATIO) 20 MG tablet TAKE 3-5 TABLETS BY MOUTH ONCE DAILY AS NEEDED 50 tablet 2   No current facility-administered medications on file prior to visit.     No Known Allergies  Past Medical History:  Diagnosis Date  . HTN (hypertension)   . OSA (obstructive sleep apnea)    PSG 09/23/10 AHI 22  . RBBB (right bundle branch block)     Past Surgical History:  Procedure Laterality Date  . APPENDECTOMY  1988  . fracture L arm    . TONSILLECTOMY    . VASECTOMY  1997     Family History  Problem Relation Age of Onset  . Stroke Father 52       doing okay  . Skin cancer Mother   . Stroke Paternal Grandfather        and PGGF  . Hypertension Unknown        dad's side   . Diabetes Neg Hx        DM   . Coronary artery disease Neg Hx   . Prostate cancer Neg Hx        no colon cancer   . Colon cancer Neg Hx     Social History   Socioeconomic History  . Marital status: Married    Spouse name: Not on file  . Number of children: 2  . Years of education: Not on file  . Highest education level: Not on file  Occupational History  . 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 Financial resource strain: Not on file  . Food  insecurity:    Worry: Not on file    Inability: Not on file  . Transportation needs:    Medical: Not on file    Non-medical: Not on file  Tobacco Use  . Smoking status: Never Smoker  . Smokeless tobacco: Former Systems developer    Types: Chew  . Tobacco comment: rare cigarillo   Substance and Sexual Activity  . Alcohol use: Yes    Comment: once monthly on average  . Drug use: No  . Sexual activity: Not on file  Lifestyle  . Physical activity:    Days per week: Not on file    Minutes per session: Not on file  . Stress: Not on file  Relationships  . Social connections:    Talks on phone: Not on file    Gets together: Not on file    Attends religious service: Not on file    Active member of club or organization: Not on file    Attends meetings of clubs or organizations: Not on file    Relationship status: Not on file  . Intimate partner violence:  Fear of current or ex partner: Not on file    Emotionally abused: Not on file    Physically abused: Not on file    Forced sexual activity: Not on file  Other Topics Concern  . Not on file  Social History Narrative   Married, 2 children.   [employer] baseball.           Review of Systems  Constitutional: Positive for unexpected weight change. Negative for fatigue.       Wears seat belt  HENT: Negative for dental problem, hearing loss and tinnitus.        Keeps up with dentist  Eyes: Negative for visual disturbance.       No diplopia or unilateral vision loss  Respiratory: Negative for cough, chest tightness and shortness of breath.   Cardiovascular: Negative for chest pain, palpitations and leg swelling.  Gastrointestinal: Negative for abdominal pain, blood in stool and constipation.       No heartburn  Endocrine: Negative for polydipsia and polyuria.  Genitourinary: Negative for difficulty urinating and urgency.       Satisfied with sildenafil  Musculoskeletal: Negative for arthralgias, back pain and joint swelling.       Broken  finger still not normal --but is better  Skin: Negative for rash.       No suspicious lesions  Allergic/Immunologic: Negative for environmental allergies and immunocompromised state.  Neurological: Negative for dizziness, syncope, light-headedness and headaches.  Hematological: Negative for adenopathy. Does not bruise/bleed easily.  Psychiatric/Behavioral: Negative for dysphoric mood and sleep disturbance. The patient is not nervous/anxious.        Objective:   Physical Exam  Constitutional: He appears well-developed. No distress.  Respiratory: Effort normal. No respiratory distress.  Psychiatric: He has a normal mood and affect. His behavior is normal.           Assessment & Plan:

## 2019-02-06 NOTE — Assessment & Plan Note (Signed)
BP Readings from Last 3 Encounters:  10/09/18 130/86  10/02/18 130/78  10/03/17 136/80   Has been well controlled Due for labs

## 2019-02-06 NOTE — Assessment & Plan Note (Signed)
Feels his sleep is good

## 2019-02-10 ENCOUNTER — Other Ambulatory Visit: Payer: BC Managed Care – PPO

## 2019-02-11 ENCOUNTER — Other Ambulatory Visit: Payer: BC Managed Care – PPO

## 2019-04-01 ENCOUNTER — Other Ambulatory Visit: Payer: Self-pay | Admitting: Internal Medicine

## 2019-08-10 ENCOUNTER — Other Ambulatory Visit: Payer: Self-pay | Admitting: Internal Medicine

## 2019-11-04 ENCOUNTER — Ambulatory Visit (INDEPENDENT_AMBULATORY_CARE_PROVIDER_SITE_OTHER)
Admission: RE | Admit: 2019-11-04 | Discharge: 2019-11-04 | Disposition: A | Discharge status: Home / Self Care | Payer: BC Managed Care – PPO | Source: Ambulatory Visit | Attending: Family Medicine | Admitting: Family Medicine

## 2019-11-04 ENCOUNTER — Ambulatory Visit: Payer: BC Managed Care – PPO | Admitting: Family Medicine

## 2019-11-04 ENCOUNTER — Other Ambulatory Visit: Payer: Self-pay

## 2019-11-04 ENCOUNTER — Encounter: Payer: Self-pay | Admitting: Family Medicine

## 2019-11-04 VITALS — BP 150/100 | HR 77 | Temp 98.0°F | Ht 73.0 in | Wt 228.5 lb

## 2019-11-04 DIAGNOSIS — M545 Low back pain, unspecified: Secondary | ICD-10-CM

## 2019-11-04 DIAGNOSIS — M79645 Pain in left finger(s): Secondary | ICD-10-CM | POA: Diagnosis not present

## 2019-11-04 DIAGNOSIS — G4730 Sleep apnea, unspecified: Secondary | ICD-10-CM | POA: Diagnosis not present

## 2019-11-04 MED ORDER — DICLOFENAC SODIUM 75 MG PO TBEC: 75.0000 mg | DELAYED_RELEASE_TABLET | Freq: Two times a day (BID) | ORAL | 0 refills | Status: DC

## 2019-11-04 NOTE — Progress Notes (Signed)
Chief Complaint  Patient presents with  . Back Pain    since Thursday  . Trigger Finger    Left Ring Finger    History of Present Illness: HPI  59 year old male patient of Dr. Alla German  presents for 2 issues:   1.low back pain x 5 days, right  Moved  Box blade 200 lbs over to fasten on tractor.. Following this day later soreness in right low back.   No falls.  No fever, no radiation of pain.  no numbness no weakness.  Has not treated with any med.  Brother in Sports coach is PT.   Pain is some better today.  HX of back injury in 1994 with lifting weights  Has back pain off and on   2.  Left ring finger pain and triggering:  ongoing off and on for 4-5 week  3. OSA Tried CPAP 10 years ago.. could not tolerate it.  Now wants to try again.  Occ gasping for air at night. No CP, no SOB.  no headache, does feel tired.  No weight change Saw Dr. Halford Chessman in past.  This visit occurred during the SARS-CoV-2 public health emergency.  Safety protocols were in place, including screening questions prior to the visit, additional usage of staff PPE, and extensive cleaning of exam room while observing appropriate contact time as indicated for disinfecting solutions.   COVID 19 screen:  No recent travel or known exposure to COVID19 The patient denies respiratory symptoms of COVID 19 at this time. The importance of social distancing was discussed today.     Review of Systems  Constitutional: Negative for chills and fever.  HENT: Negative for congestion and ear pain.   Eyes: Negative for pain and redness.  Respiratory: Negative for cough and shortness of breath.   Cardiovascular: Negative for chest pain, palpitations and leg swelling.  Gastrointestinal: Negative for abdominal pain, blood in stool, constipation, diarrhea, nausea and vomiting.  Genitourinary: Negative for dysuria.  Musculoskeletal: Negative for falls and myalgias.  Skin: Negative for rash.  Neurological: Negative for dizziness.   Psychiatric/Behavioral: Negative for depression. The patient is not nervous/anxious.       Past Medical History:  Diagnosis Date  . HTN (hypertension)   . OSA (obstructive sleep apnea)    PSG 09/23/10 AHI 22  . RBBB (right bundle branch block)     reports that he has never smoked. He has quit using smokeless tobacco.  His smokeless tobacco use included chew. He reports current alcohol use. He reports that he does not use drugs.   Current Outpatient Medications:  .  bisoprolol-hydrochlorothiazide (ZIAC) 2.5-6.25 MG tablet, TAKE 1 TABLET BY MOUTH ONCE DAILY, Disp: 90 tablet, Rfl: 3 .  sildenafil (REVATIO) 20 MG tablet, TAKE 3-5 TABLETS BY MOUTH ONCE DAILY AS NEEDED, Disp: 50 tablet, Rfl: 5   Observations/Objective: Blood pressure (!) 150/100, pulse 77, temperature 98 F (36.7 C), temperature source Temporal, height 6\' 1"  (1.854 m), weight 228 lb 8 oz (103.6 kg), SpO2 94 %.  Physical Exam Constitutional:      Appearance: He is well-developed.  HENT:     Head: Normocephalic.     Right Ear: Hearing normal.     Left Ear: Hearing normal.     Nose: Nose normal.  Neck:     Thyroid: No thyroid mass or thyromegaly.     Vascular: No carotid bruit.     Trachea: Trachea normal.  Cardiovascular:     Rate and Rhythm: Normal rate and  regular rhythm.     Pulses: Normal pulses.     Heart sounds: Heart sounds not distant. No murmur. No friction rub. No gallop.      Comments: No peripheral edema Pulmonary:     Effort: Pulmonary effort is normal. No respiratory distress.     Breath sounds: Normal breath sounds.  Musculoskeletal:     Cervical back: Normal.     Thoracic back: Normal.     Lumbar back: Tenderness and bony tenderness present. Decreased range of motion. Negative right straight leg raise test and negative left straight leg raise test.  Skin:    General: Skin is warm and dry.     Findings: No rash.  Psychiatric:        Speech: Speech normal.        Behavior: Behavior normal.         Thought Content: Thought content normal.    Left middle finger with triggering and pain/nodule at MCP joint.  Assessment and Plan    Acute right-sided low back pain without sciatica  Given related to injury.. will eval with X-ray.  Can use diclofenac , heat  for pain in low back.  Start home physical therapy  Follow up with Dr. Silvio Pate if not improving in 2 weeks for further evaluation.   Sleep apnea Refer for sleep study.  Pain of left middle finger If not improving follow up with sports med for re-eval and possible steroid injection.    Eliezer Lofts, MD

## 2019-11-04 NOTE — Patient Instructions (Addendum)
We will call with sleep referral.  Call for appt with Dr. Lorelei Pont for trigger finger if not improving  We call with  X-ray results.  Can use diclofenac , heat  for pain in low back.  Start home physical therapy  Follow up with Dr. Silvio Pate if not improving in 2 weeks for further evaluation.

## 2019-12-01 ENCOUNTER — Encounter: Payer: Self-pay | Admitting: Pulmonary Disease

## 2019-12-01 ENCOUNTER — Other Ambulatory Visit: Payer: Self-pay

## 2019-12-01 ENCOUNTER — Ambulatory Visit (INDEPENDENT_AMBULATORY_CARE_PROVIDER_SITE_OTHER): Payer: BC Managed Care – PPO | Admitting: Pulmonary Disease

## 2019-12-01 VITALS — BP 126/70 | HR 74 | Temp 97.3°F | Ht 73.0 in | Wt 229.2 lb

## 2019-12-01 DIAGNOSIS — M545 Low back pain, unspecified: Secondary | ICD-10-CM | POA: Insufficient documentation

## 2019-12-01 DIAGNOSIS — G4733 Obstructive sleep apnea (adult) (pediatric): Secondary | ICD-10-CM | POA: Diagnosis not present

## 2019-12-01 NOTE — Progress Notes (Signed)
Subjective:    Patient ID: Alex [name], male    DOB: [birth year], 59 y.o.   MRN: [id number]  HPI  59 year old man presents to reestablish care for OSA. He was diagnosed in 2011 with moderate OSA, started on CPAP 10 cm with full facemask and had significant improvement in his daytime somnolence and snoring.  For some reason after a few months he stopped using his machine.  He was working 2 jobs then and felt like he did not want to have additional stress of using his CPAP machine.  He has since been on disability for the last few years. History obtained from wife was noted loud snoring and witnessed apneas. Epworth sleepiness score is 10 and he reports some tiredness in the daytime but denies excessive somnolence.  Bedtime is between 11 PM and midnight, sleep latency is minimal, he starts by sleeping on his side but rolls over on his back with 1 pillow, reports 1-2 nocturnal awakenings for nocturia and is out of bed by 9 AM feeling rested without dryness of mouth or headaches. His weight is mostly unchanged between 234 and 229 pounds for the last 10 years  There is no history suggestive of cataplexy, sleep paralysis or parasomnias   Significant tests/ events reviewed  NPSG 08/2010:  AHI 22.6, SpO2 low 82%.  Significant REM and positional effect. >> CPAP 10 cm   Past Medical History:  Diagnosis Date  . HTN (hypertension)   . OSA (obstructive sleep apnea)    PSG 09/23/10 AHI 22  . RBBB (right bundle branch block)    Past Surgical History:  Procedure Laterality Date  . APPENDECTOMY  1988  . fracture L arm    . TONSILLECTOMY    . VASECTOMY  1997     No Known Allergies  Social History   Socioeconomic History  . Marital status: Married    Spouse name: Not on file  . Number of children: 2  . Years of education: Not on file  . Highest education level: Not on file  Occupational History  . 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 Smoking  status: Never Smoker  . Smokeless tobacco: Former Systems developer    Types: Chew  . Tobacco comment: rare cigarillo   Substance and Sexual Activity  . Alcohol use: Yes    Comment: once monthly on average  . Drug use: No  . Sexual activity: Not on file  Other Topics Concern  . Not on file  Social History Narrative   Married, 2 children.   [employer].        Social Determinants of Health   Financial Resource Strain:   . Difficulty of Paying Living Expenses: Not on file  Food Insecurity:   . Worried About Charity fundraiser in the Last Year: Not on file  . Ran Out of Food in the Last Year: Not on file  Transportation Needs:   . Lack of Transportation (Medical): Not on file  . Lack of Transportation (Non-Medical): Not on file  Physical Activity:   . Days of Exercise per Week: Not on file  . Minutes of Exercise per Session: Not on file  Stress:   . Feeling of Stress : Not on file  Social Connections:   . Frequency of Communication with Friends and Family: Not on file  . Frequency of Social Gatherings with Friends and Family: Not on file  . Attends Religious Services: Not on file  .  Active Member of Clubs or Organizations: Not on file  . Attends Archivist Meetings: Not on file  . Marital Status: Not on file  Intimate Partner Violence:   . Fear of Current or Ex-Partner: Not on file  . Emotionally Abused: Not on file  . Physically Abused: Not on file  . Sexually Abused: Not on file     Family History  Problem Relation Age of Onset  . Stroke Father 47       doing okay  . Skin cancer Mother   . Stroke Paternal Grandfather        and PGGF  . Hypertension Unknown        dad's side   . Diabetes Neg Hx        DM   . Coronary artery disease Neg Hx   . Prostate cancer Neg Hx        no colon cancer   . Colon cancer Neg Hx      Review of Systems Constitutional: negative for anorexia, fevers and sweats  Eyes: negative for irritation, redness and visual  disturbance  Ears, nose, mouth, throat, and face: negative for earaches, epistaxis, nasal congestion and sore throat  Respiratory: negative for cough, dyspnea on exertion, sputum and wheezing  Cardiovascular: negative for chest pain, dyspnea, lower extremity edema, orthopnea, palpitations and syncope  Gastrointestinal: negative for abdominal pain, constipation, diarrhea, melena, nausea and vomiting  Genitourinary:negative for dysuria, frequency and hematuria  Hematologic/lymphatic: negative for bleeding, easy bruising and lymphadenopathy  Musculoskeletal:negative for arthralgias, muscle weakness and stiff joints  Neurological: negative for coordination problems, gait problems, headaches and weakness  Endocrine: negative for diabetic symptoms including polydipsia, polyuria and weight loss     Objective:   Physical Exam  Gen. Pleasant, obese, in no distress, normal affect ENT - no pallor,icterus, no post nasal drip, class 2-3 airway Neck: No JVD, no thyromegaly, no carotid bruits Lungs: no use of accessory muscles, no dullness to percussion, decreased without rales or rhonchi  Cardiovascular: Rhythm regular, heart sounds  normal, no murmurs or gallops, no peripheral edema Abdomen: soft and non-tender, no hepatosplenomegaly, BS normal. Musculoskeletal: No deformities, no cyanosis or clubbing Neuro:  alert, non focal, no tremors       Assessment & Plan:  Reviewed prior sleep study and CPAP report Last Labs History obtained independently from wife.   OSA - The pathophysiology of obstructive sleep apnea , it's cardiovascular consequences & modes of treatment including CPAP were discused with the patient in detail & they evidenced understanding. We discussed cardiovascular implications of moderate OSA  Schedule home sleep test. Based on this, we will get him a new CPAP machine.  It seems that last time he had a problem with too little pressure.  We will probably get him on auto settings  and if needed provide him higher pressure.  I discussed compliance with him and compliance monitoring issues  Advised against medications with sedative side effects Cautioned against driving when sleepy - understanding that sleepiness will vary on a day to day basis Alternative treatment such as oral appliance and inspire device was discussed   Obesity-we discussed weight loss of 10 to 15 pounds, he does have a lot of muscle weight. Impact of weight on OSA was discussed

## 2019-12-01 NOTE — Assessment & Plan Note (Signed)
If not improving follow up with sports med for re-eval and possible steroid injection.

## 2019-12-01 NOTE — Patient Instructions (Signed)
Schedule home sleep test. Based on this, we will get you new CPAP machine

## 2019-12-01 NOTE — Assessment & Plan Note (Addendum)
The pathophysiology of obstructive sleep apnea , it's cardiovascular consequences & modes of treatment including CPAP were discused with the patient in detail & they evidenced understanding. We discussed cardiovascular implications of moderate OSA  Schedule home sleep test. Based on this, we will get him a new CPAP machine.  It seems that last time he had a problem with too little pressure.  We will probably get him on auto settings and if needed provide him higher pressure.  I discussed compliance with him and compliance monitoring issues

## 2019-12-01 NOTE — Assessment & Plan Note (Signed)
Refer for sleep study

## 2019-12-01 NOTE — Assessment & Plan Note (Signed)
Given related to injury.. will eval with X-ray.  Can use diclofenac , heat  for pain in low back.  Start home physical therapy  Follow up with Dr. Silvio Pate if not improving in 2 weeks for further evaluation.

## 2019-12-18 ENCOUNTER — Other Ambulatory Visit: Payer: Self-pay | Admitting: Internal Medicine

## 2019-12-23 ENCOUNTER — Other Ambulatory Visit: Payer: Self-pay

## 2019-12-23 ENCOUNTER — Ambulatory Visit: Payer: BC Managed Care – PPO

## 2019-12-23 DIAGNOSIS — G4733 Obstructive sleep apnea (adult) (pediatric): Secondary | ICD-10-CM

## 2019-12-24 DIAGNOSIS — G4733 Obstructive sleep apnea (adult) (pediatric): Secondary | ICD-10-CM | POA: Diagnosis not present

## 2020-01-01 ENCOUNTER — Telehealth: Payer: Self-pay | Admitting: Pulmonary Disease

## 2020-01-01 DIAGNOSIS — G4733 Obstructive sleep apnea (adult) (pediatric): Secondary | ICD-10-CM

## 2020-01-01 NOTE — Telephone Encounter (Signed)
HST 3/31 showed moderate to severe OSA, average 23/hour -Was as bad as 40/hour when supine  Suggest auto CPAP 8-20 cm, mask of choice, office visit in 6 weeks with APP/me

## 2020-01-01 NOTE — Telephone Encounter (Signed)
Advised pt of results. Pt understood and nothing further is needed.   CPAP ordered.

## 2020-01-02 ENCOUNTER — Telehealth: Payer: Self-pay

## 2020-01-02 NOTE — Telephone Encounter (Signed)
Dr. Elsworth Soho recommendations  Moderate obstructive sleep apnea   Events were severe in supine position  Treatment includes weight loss and CPAP therapy  Auto CPAP can be tries and avoid driving when sleepy and against medications with sedative side effects

## 2020-01-05 NOTE — Telephone Encounter (Signed)
Please see previous phone note  Suggest auto CPAP 8-20 cm, mask of choice, office visit in 6 weeks with APP/me

## 2020-01-05 NOTE — Telephone Encounter (Signed)
Dr. Elsworth Soho, please advise pressure settings.

## 2020-01-05 NOTE — Telephone Encounter (Signed)
Called spoke with patient. Alex [name] states in previous phone message she sent in order. Patient has not heard anything from [place] yet. I told him he doesn't hear anything by the end of the week call out office.   Patient is scheduled for follow up with APP on 02/16/20 Nothing further needed at this time.

## 2020-02-10 ENCOUNTER — Encounter: Payer: BC Managed Care – PPO | Admitting: Internal Medicine

## 2020-02-15 NOTE — Progress Notes (Signed)
@Patient  ID: Alex [name], male    DOB: 08/13/1961, 60 y.o.   MRN: [id number]  Chief Complaint  Patient presents with  . Follow-up    F/U for OSA. Uses [employer] as his DME. States his mask is not comfortable. Wants to discuss an increase in pressure. Increase in sinus pressure.     Referring provider: Venia Carbon, MD  HPI:  59 year old male former smoker followed in our office for obstructive sleep apnea  PMH: Hypertension Smoker/ Smoking History: former smoker Maintenance: None  Pt of: Dr. Elsworth Soho  02/16/2020  - Visit   59 year old male former smoker followed in our office for obstructive sleep apnea.  Patient completed home sleep study in March/2021 that showed moderate obstructive sleep apnea and severe obstructive sleep apnea when supine.  Patient reports has been doing well since starting CPAP therapy.  He did have a break in therapy when he traveled to [place] and he left his CPAP at his family's house.  CPAP compliance report reflects this see his CPAP compliance were listed below:  01/14/2020-02/12/2020-21 out of last 30 days use, 19 of those days greater than 4 hours, average usage 5 hours and 52 minutes, APAP setting 8-20, AHI 0.7, 95th percentile 11.5  Patient is wondering if he could potentially have the starting pressures increased any would like to have his pressures increased.  We will discuss this today.  Questionaires / Pulmonary Flowsheets:   Epworth:  Results of the Epworth flowsheet 12/01/2019  Sitting and reading 2  Watching TV 2  Sitting, inactive in a public place (e.g. a theatre or a meeting) 1  As a passenger in a car for an hour without a break 1  Lying down to rest in the afternoon when circumstances permit 2  Sitting and talking to someone 0  Sitting quietly after a lunch without alcohol 2  In a car, while stopped for a few minutes in traffic 0  Total score 10    Tests:   HST 3/31 showed moderate to severe OSA, average 23/hour >>>Was as bad  as 40/hour when supine >>>Suggest auto CPAP 8-20 cm, mask of choice, office visit in 6 weeks with APP/me  FENO:  No results found for: NITRICOXIDE  PFT: No flowsheet data found.  WALK:  No flowsheet data found.  Imaging: No results found.  Lab Results:  CBC    Component Value Date/Time   WBC 6.9 10/03/2017 0923   RBC 5.15 10/03/2017 0923   HGB 14.9 10/03/2017 0923   HCT 45.1 10/03/2017 0923   PLT 233.0 10/03/2017 0923   MCV 87.5 10/03/2017 0923   MCHC 33.1 10/03/2017 0923   RDW 12.7 10/03/2017 0923   LYMPHSABS 2.8 09/28/2016 1651   MONOABS 0.4 09/28/2016 1651   EOSABS 0.5 09/28/2016 1651   BASOSABS 0.0 09/28/2016 1651    BMET    Component Value Date/Time   NA 140 10/03/2017 0923   K 4.5 10/03/2017 0923   CL 104 10/03/2017 0923   CO2 31 10/03/2017 0923   GLUCOSE 105 (H) 10/03/2017 0923   BUN 13 10/03/2017 0923   CREATININE 1.05 10/03/2017 0923   CALCIUM 9.4 10/03/2017 0923   GFRNONAA 73.04 02/14/2010 1500    BNP No results found for: BNP  ProBNP No results found for: PROBNP  Specialty Problems      Pulmonary Problems   Sleep apnea    Qualifier: Diagnosis of  By: Halford Chessman MD, Elisabeth Cara  No Known Allergies  Immunization History  Administered Date(s) Administered  . Influenza,inj,Quad PF,6+ Mos 10/03/2017, 10/02/2018  . Td 01/03/2010  . Tdap 08/21/2016    Past Medical History:  Diagnosis Date  . HTN (hypertension)   . OSA (obstructive sleep apnea)    PSG 09/23/10 AHI 22  . RBBB (right bundle branch block)     Tobacco History: Social History   Tobacco Use  Smoking Status Never Smoker  Smokeless Tobacco Former Systems developer  . Types: Chew  Tobacco Comment   rare cigarillo    Counseling given: Yes Comment: rare cigarillo    Continue to not smoke  Outpatient Encounter Medications as of 02/16/2020  Medication Sig  . bisoprolol-hydrochlorothiazide (ZIAC) 2.5-6.25 MG tablet TAKE 1 TABLET BY MOUTH ONCE DAILY  . sildenafil (REVATIO) 20  MG tablet TAKE 3-5 TABLETS BY MOUTH ONCE DAILY AS NEEDED   No facility-administered encounter medications on file as of 02/16/2020.     Review of Systems  Review of Systems  Constitutional: Negative for activity change, chills, fatigue, fever and unexpected weight change.  HENT: Negative for postnasal drip, rhinorrhea, sinus pressure, sinus pain and sore throat.   Eyes: Negative.   Respiratory: Negative for cough, shortness of breath and wheezing.   Cardiovascular: Negative for chest pain and palpitations.  Gastrointestinal: Negative for constipation, diarrhea, nausea and vomiting.  Endocrine: Negative.   Genitourinary: Negative.   Musculoskeletal: Negative.   Skin: Negative.   Neurological: Negative for dizziness and headaches.  Psychiatric/Behavioral: Negative.  Negative for dysphoric mood. The patient is not nervous/anxious.   All other systems reviewed and are negative.    Physical Exam  BP 122/70 (BP Location: Left Arm, Patient Position: Sitting, Cuff Size: Normal)   Pulse 60   Ht 6\' 1"  (1.854 m)   Wt 231 lb 3.2 oz (104.9 kg)   SpO2 97% Comment: on RA  BMI 30.50 kg/m   Wt Readings from Last 5 Encounters:  02/16/20 231 lb 3.2 oz (104.9 kg)  12/01/19 229 lb 3.2 oz (104 kg)  11/04/19 228 lb 8 oz (103.6 kg)  02/06/19 234 lb (106.1 kg)  10/09/18 218 lb 4 oz (99 kg)    BMI Readings from Last 5 Encounters:  02/16/20 30.50 kg/m  12/01/19 30.24 kg/m  11/04/19 30.15 kg/m  02/06/19 30.87 kg/m  10/09/18 28.79 kg/m     Physical Exam Vitals and nursing note reviewed.  Constitutional:      General: He is not in acute distress.    Appearance: Normal appearance. He is normal weight.  HENT:     Head: Normocephalic and atraumatic.     Right Ear: Hearing and external ear normal.     Left Ear: Hearing and external ear normal.     Nose: Nose normal. No mucosal edema or rhinorrhea.     Right Turbinates: Not enlarged.     Left Turbinates: Not enlarged.      Mouth/Throat:     Mouth: Mucous membranes are dry.     Pharynx: Oropharynx is clear. No oropharyngeal exudate.     Comments: Mallampati 3 Eyes:     Pupils: Pupils are equal, round, and reactive to light.  Cardiovascular:     Rate and Rhythm: Normal rate and regular rhythm.     Pulses: Normal pulses.     Heart sounds: Normal heart sounds. No murmur.  Pulmonary:     Effort: Pulmonary effort is normal.     Breath sounds: Normal breath sounds. No decreased breath sounds, wheezing or  rales.  Musculoskeletal:     Cervical back: Normal range of motion.     Right lower leg: No edema.     Left lower leg: No edema.  Lymphadenopathy:     Cervical: No cervical adenopathy.  Skin:    General: Skin is warm and dry.     Capillary Refill: Capillary refill takes less than 2 seconds.     Findings: No erythema or rash.  Neurological:     General: No focal deficit present.     Mental Status: He is alert and oriented to person, place, and time.     Motor: No weakness.     Coordination: Coordination normal.     Gait: Gait is intact. Gait normal.  Psychiatric:        Mood and Affect: Mood normal.        Behavior: Behavior normal. Behavior is cooperative.        Thought Content: Thought content normal.        Judgment: Judgment normal.       Assessment & Plan:   Sleep apnea Reviewed home sleep study results with patient Reviewed positional sleep apnea Reviewed CPAP compliance report Discussed pathophysiology of obstructive sleep apnea  Plan: Change CPAP settings to APAP 10-20 Shortened ramp up. Explained to patient that if he continues to have any difficulties with his mask to let us know and we could coordinate either mask fitting with his DME company or in the sleep lab We will request download in 4 weeks Follow-up in 3 months with Dr. Elsworth Soho    Return in about 3 months (around 05/18/2020), or if symptoms worsen or fail to improve, for Follow up with Dr. Elsworth Soho.   Lauraine Rinne,  NP 02/16/2020   This appointment required 32 minutes of patient care (this includes precharting, chart review, review of results, face-to-face care, etc.).

## 2020-02-16 ENCOUNTER — Other Ambulatory Visit: Payer: Self-pay

## 2020-02-16 ENCOUNTER — Ambulatory Visit: Payer: BC Managed Care – PPO | Admitting: Pulmonary Disease

## 2020-02-16 ENCOUNTER — Encounter: Payer: Self-pay | Admitting: Pulmonary Disease

## 2020-02-16 VITALS — BP 122/70 | HR 60 | Ht 73.0 in | Wt 231.2 lb

## 2020-02-16 DIAGNOSIS — G4733 Obstructive sleep apnea (adult) (pediatric): Secondary | ICD-10-CM

## 2020-02-16 NOTE — Patient Instructions (Addendum)
You were seen today by Lauraine Rinne, NP  for:   [facility] meeting you today.  Continue to use your CPAP.  We will make some adjustments to your pressure settings as discussed.  If you continue to have any issues using it please let us know.  We will see you back in 3 months.  Sooner if you have any questions or need anything.  Take care of yourself and stay safe,  Alex [name]  1. Obstructive sleep apnea syndrome  Change APAP settings to 10-20 Shortened ramp up.  To 5 minutes  We recommend that you continue using your CPAP daily >>>Keep up the hard work using your device >>> Goal should be wearing this for the entire night that you are sleeping, at least 4 to 6 hours  Remember:  . Do not drive or operate heavy machinery if tired or drowsy.  . Please notify the supply company and office if you are unable to use your device regularly due to missing supplies or machine being broken.  . Work on maintaining a healthy weight and following your recommended nutrition plan  . Maintain proper daily exercise and movement  . Maintaining proper use of your device can also help improve management of other chronic illnesses such as: Blood pressure, blood sugars, and weight management.   BiPAP/ CPAP Cleaning:  >>>Clean weekly, with Dawn soap, and bottle brush.  Set up to air dry. >>> Wipe mask out daily with wet wipe or towelette   Follow Up:    Return in about 3 months (around 05/18/2020), or if symptoms worsen or fail to improve, for Follow up with Dr. Elsworth Soho.   Please do your part to reduce the spread of COVID-19:      Reduce your risk of any infection  and COVID19 by using the similar precautions used for avoiding the common cold or flu:  Marland Kitchen Wash your hands often with soap and warm water for at least 20 seconds.  If soap and water are not readily available, use an alcohol-based hand sanitizer with at least 60% alcohol.  . If coughing or sneezing, cover your mouth and nose by coughing or sneezing into  the elbow areas of your shirt or coat, into a tissue or into your sleeve (not your hands). Langley Gauss A MASK when in public  . Avoid shaking hands with others and consider head nods or verbal greetings only. . Avoid touching your eyes, nose, or mouth with unwashed hands.  . Avoid close contact with people who are sick. . Avoid places or events with large numbers of people in one location, like concerts or sporting events. . If you have some symptoms but not all symptoms, continue to monitor at home and seek medical attention if your symptoms worsen. . If you are having a medical emergency, call 911.   [place] / e-Visit: [email or web address]         [facility]: [facility]: [id number]                   [facility]: [id number]     It is flu season:   >>> Best ways to protect herself from the flu: Receive the yearly flu vaccine, practice good hand hygiene washing with soap and also using hand sanitizer when available, eat a nutritious meals, get adequate rest, hydrate appropriately   Please contact the office if your symptoms worsen or you  have concerns that you are not improving.   Thank you for choosing [facility] for your healthcare, and for allowing [place] to partner with you on your healthcare journey. I am thankful to be able to provide care to you today.   [name] FNP-C

## 2020-02-16 NOTE — Assessment & Plan Note (Signed)
Reviewed home sleep study results with patient Reviewed positional sleep apnea Reviewed CPAP compliance report Discussed pathophysiology of obstructive sleep apnea  Plan: Change CPAP settings to APAP 10-20 Shortened ramp up. Explained to patient that if he continues to have any difficulties with his mask to let us know and we could coordinate either mask fitting with his DME company or in the sleep lab We will request download in 4 weeks Follow-up in 3 months with Dr. Elsworth Soho

## 2020-02-18 ENCOUNTER — Ambulatory Visit (INDEPENDENT_AMBULATORY_CARE_PROVIDER_SITE_OTHER): Payer: BC Managed Care – PPO | Admitting: Internal Medicine

## 2020-02-18 ENCOUNTER — Encounter: Payer: Self-pay | Admitting: Internal Medicine

## 2020-02-18 ENCOUNTER — Other Ambulatory Visit: Payer: Self-pay

## 2020-02-18 VITALS — BP 130/84 | HR 72 | Temp 98.2°F | Ht 73.0 in | Wt 232.0 lb

## 2020-02-18 DIAGNOSIS — G4733 Obstructive sleep apnea (adult) (pediatric): Secondary | ICD-10-CM | POA: Diagnosis not present

## 2020-02-18 DIAGNOSIS — Z125 Encounter for screening for malignant neoplasm of prostate: Secondary | ICD-10-CM | POA: Diagnosis not present

## 2020-02-18 DIAGNOSIS — F609 Personality disorder, unspecified: Secondary | ICD-10-CM | POA: Diagnosis not present

## 2020-02-18 DIAGNOSIS — I10 Essential (primary) hypertension: Secondary | ICD-10-CM

## 2020-02-18 DIAGNOSIS — Z0000 Encounter for general adult medical examination without abnormal findings: Secondary | ICD-10-CM | POA: Diagnosis not present

## 2020-02-18 LAB — LIPID PANEL
Cholesterol: 187 mg/dL (ref 0–200)
HDL: 40.5 mg/dL (ref >39.00)
LDL Cholesterol: 110 mg/dL — ABNORMAL HIGH (ref 0–99)
NonHDL: 146.38
Total CHOL/HDL Ratio: 5
Triglycerides: 180 mg/dL — ABNORMAL HIGH (ref 0.0–149.0)
VLDL: 36 mg/dL (ref 0.0–40.0)

## 2020-02-18 LAB — CBC
HCT: 44.5 % (ref 39.0–52.0)
Hemoglobin: 14.9 g/dL (ref 13.0–17.0)
MCHC: 33.6 g/dL (ref 30.0–36.0)
MCV: 87 fl (ref 78.0–100.0)
Platelets: 213 10*3/uL (ref 150.0–400.0)
RBC: 5.12 Mil/uL (ref 4.22–5.81)
RDW: 12.7 % (ref 11.5–15.5)
WBC: 7.6 10*3/uL (ref 4.0–10.5)

## 2020-02-18 LAB — COMPREHENSIVE METABOLIC PANEL
ALT: 27 U/L (ref 0–53)
AST: 21 U/L (ref 0–37)
Albumin: 4.6 g/dL (ref 3.5–5.2)
Alkaline Phosphatase: 94 U/L (ref 39–117)
BUN: 16 mg/dL (ref 6–23)
CO2: 29 mEq/L (ref 19–32)
Calcium: 10.1 mg/dL (ref 8.4–10.5)
Chloride: 100 mEq/L (ref 96–112)
Creatinine, Ser: 1.07 mg/dL (ref 0.40–1.50)
GFR: 71.12 mL/min (ref >60.00)
Glucose, Bld: 84 mg/dL (ref 70–99)
Potassium: 3.8 mEq/L (ref 3.5–5.1)
Sodium: 137 mEq/L (ref 135–145)
Total Bilirubin: 1.3 mg/dL — ABNORMAL HIGH (ref 0.2–1.2)
Total Protein: 6.8 g/dL (ref 6.0–8.3)

## 2020-02-18 LAB — PSA: PSA: 4.73 ng/mL — ABNORMAL HIGH (ref 0.10–4.00)

## 2020-02-18 LAB — T4, FREE: Free T4: 0.87 ng/dL (ref 0.60–1.60)

## 2020-02-18 NOTE — Assessment & Plan Note (Signed)
BP Readings from Last 3 Encounters:  02/18/20 130/84  02/16/20 122/70  12/01/19 126/70   Good control Will check labs on the bisoprolol/HCTZ

## 2020-02-18 NOTE — Assessment & Plan Note (Signed)
Healthy Urged him to get COVID vaccine Flu vaccine in fall Discussed exercise Colon due 2025 Will check PSA

## 2020-02-18 NOTE — Assessment & Plan Note (Signed)
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

## 2020-02-18 NOTE — Assessment & Plan Note (Signed)
Now on CPAP. 

## 2020-02-18 NOTE — Progress Notes (Signed)
Subjective:    Patient ID: Alex [name], male    DOB: 01-10-1961, 59 y.o.   MRN: [id number]  HPI Here for physical This visit occurred during the SARS-CoV-2 public health emergency.  Safety protocols were in place, including screening questions prior to the visit, additional usage of staff PPE, and extensive cleaning of exam room while observing appropriate contact time as indicated for disinfecting solutions.   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 bisoprolol-hydrochlorothiazide (ZIAC) 2.5-6.25 MG tablet TAKE 1 TABLET BY MOUTH ONCE DAILY 90 tablet 3  . sildenafil (REVATIO) 20 MG tablet TAKE 3-5 TABLETS BY MOUTH ONCE DAILY AS NEEDED 50 tablet 5   No current facility-administered medications on file prior to visit.    No Known Allergies  Past Medical History:  Diagnosis Date  . HTN (hypertension)   . OSA (obstructive sleep apnea)    PSG 09/23/10 AHI 22  . RBBB (right bundle branch block)     Past Surgical History:  Procedure Laterality Date  . APPENDECTOMY  1988  . fracture L arm    . TONSILLECTOMY    . VASECTOMY  1997     Family History  Problem Relation Age of Onset  . Stroke Father 71       doing okay  . Skin cancer Mother   . Stroke Paternal Grandfather        and PGGF  . Hypertension Unknown        dad's side   . Diabetes Neg Hx        DM   . Coronary artery disease Neg Hx   . Prostate  cancer Neg Hx        no colon cancer   . Colon cancer Neg Hx     Social History   Socioeconomic History  . Marital status: Married    Spouse name: Not on file  . Number of children: 2  . Years of education: Not on file  . Highest education level: Not on file  Occupational History  . 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 Smoking status: Never Smoker  . Smokeless tobacco: Former Systems developer    Types: Chew  . Tobacco comment: rare cigarillo   Substance and Sexual Activity  . Alcohol use: Yes    Comment: once monthly on average  . Drug use: No  . Sexual activity: Not on file  Other Topics Concern  . Not on file  Social History Narrative   Married, 2 children.   [employer].        Social Determinants of Health   Financial Resource Strain:   . Difficulty of Paying Living Expenses:   Food Insecurity:   . Worried About Charity fundraiser in the Last Year:   . Arboriculturist in the Last Year:   Transportation Needs:   . Lack of Transportation (  Medical):   Marland Kitchen Lack of Transportation (Non-Medical):   Physical Activity:   . Days of Exercise per Week:   . Minutes of Exercise per Session:   Stress:   . Feeling of Stress :   Social Connections:   . Frequency of Communication with Friends and Family:   . Frequency of Social Gatherings with Friends and Family:   . Attends Religious Services:   . Active Member of Clubs or Organizations:   . Attends Archivist Meetings:   Marland Kitchen Marital Status:   Intimate Partner Violence:   . Fear of Current or Ex-Partner:   . Emotionally Abused:   Marland Kitchen Physically Abused:   . Sexually Abused:    Review of Systems  Constitutional: Negative for unexpected weight change.       Wears seat belt Not exercising  HENT: Positive for tinnitus. Negative for dental problem and hearing loss.   Eyes: Negative for visual disturbance.       No diplopia or unilateral vision loss  Respiratory: Negative for cough,  chest tightness and shortness of breath.   Cardiovascular: Negative for chest pain, palpitations and leg swelling.  Gastrointestinal: Negative for abdominal pain, blood in stool and constipation.       Occ heartburn depending on what he eats. Uses OTC with success  Endocrine: Negative for polydipsia and polyuria.  Genitourinary: Negative for urgency.       Stream is slow but okay Satisfied with sildenafil--not as often  Musculoskeletal: Negative for arthralgias and joint swelling.       Occasional low back pain  Skin: Negative for rash.  Allergic/Immunologic: Positive for environmental allergies. Negative for immunocompromised state.       Uses OTC prn  Neurological: Negative for dizziness, syncope, light-headedness and headaches.  Hematological: Negative for adenopathy. Does not bruise/bleed easily.  Psychiatric/Behavioral: Negative for dysphoric mood. The patient is nervous/anxious.        Sleeping better with CPAP       Objective:   Physical Exam  Constitutional: He is oriented to person, place, and time. He appears well-developed. No distress.  HENT:  Head: Normocephalic and atraumatic.  Right Ear: External ear normal.  Left Ear: External ear normal.  Mouth/Throat: Oropharynx is clear and moist. No oropharyngeal exudate.  Eyes: Pupils are equal, round, and reactive to light. Conjunctivae are normal.  Neck: No thyromegaly present.  Cardiovascular: Normal rate, regular rhythm, normal heart sounds and intact distal pulses. Exam reveals no gallop.  No murmur heard. Respiratory: Effort normal and breath sounds normal. No respiratory distress. He has no wheezes. He has no rales.  GI: Soft. There is no abdominal tenderness.  Musculoskeletal:        General: No tenderness or edema.  Lymphadenopathy:    He has no cervical adenopathy.  Neurological: He is alert and oriented to person, place, and time.  Skin: No rash noted.  Many benign nevi (does get regular derm visits)    Psychiatric:  Pressured speech--preoccupied with other people's actions Not depressed           Assessment & Plan:

## 2020-02-24 ENCOUNTER — Other Ambulatory Visit: Payer: Self-pay

## 2020-02-24 ENCOUNTER — Ambulatory Visit: Payer: BC Managed Care – PPO | Admitting: Internal Medicine

## 2020-02-24 ENCOUNTER — Encounter: Payer: Self-pay | Admitting: Internal Medicine

## 2020-02-24 DIAGNOSIS — F39 Unspecified mood [affective] disorder: Secondary | ICD-10-CM | POA: Diagnosis not present

## 2020-02-24 MED ORDER — DULOXETINE HCL 30 MG PO CPEP: 30.0000 mg | ORAL_CAPSULE | Freq: Every day | ORAL | 3 refills | Status: DC

## 2020-02-24 NOTE — Progress Notes (Signed)
Subjective:    Patient ID: Alex [name], male    DOB: 1961/07/08, 59 y.o.   MRN: [id number]  HPI Here with wife due to anger issues This visit occurred during the SARS-CoV-2 public health emergency.  Safety protocols were in place, including screening questions prior to the visit, additional usage of staff PPE, and extensive cleaning of exam room while observing appropriate contact time as indicated for disinfecting solutions.   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 bisoprolol-hydrochlorothiazide (ZIAC) 2.5-6.25 MG tablet TAKE 1 TABLET BY MOUTH ONCE DAILY 90 tablet 3  . sildenafil (REVATIO) 20 MG tablet TAKE 3-5 TABLETS BY MOUTH ONCE DAILY AS NEEDED 50 tablet 5   No current facility-administered medications on file prior to visit.    No Known Allergies  Past Medical History:  Diagnosis Date  . HTN (hypertension)   . OSA (obstructive sleep apnea)    PSG 09/23/10 AHI 22  . RBBB (right bundle branch block)     Past Surgical History:  Procedure Laterality Date  . APPENDECTOMY  1988  . fracture L arm    . TONSILLECTOMY    . VASECTOMY  1997     Family History  Problem Relation Age of Onset  . Stroke Father 84       doing okay  . Skin cancer Mother   . Stroke Paternal Grandfather        and PGGF  . Hypertension Unknown        dad's side   . Diabetes Neg Hx        DM   . Coronary artery disease Neg Hx   . Prostate cancer Neg Hx        no colon cancer   . Colon cancer Neg Hx     Social History   Socioeconomic History  . Marital status:  Married    Spouse name: Not on file  . Number of children: 2  . Years of education: Not on file  . Highest education level: Not on file  Occupational History  . 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 Smoking status: Never Smoker  . Smokeless tobacco: Former Systems developer    Types: Chew  . Tobacco comment: rare cigarillo   Substance and Sexual Activity  . Alcohol use: Yes    Comment: once monthly on average  . Drug use: No  . Sexual activity: Not on file  Other Topics Concern  . Not on file  Social History Narrative   Married, 2 children.   [employer].        Social Determinants of Health   Financial Resource Strain:   . Difficulty of Paying Living Expenses:   Food Insecurity:   . Worried About Charity fundraiser in the Last Year:   . Arboriculturist in the Last Year:   Transportation Needs:   . Film/video editor (Medical):   Marland Kitchen Lack of Transportation (Non-Medical):   Physical Activity:   . Days of Exercise per  Week:   . Minutes of Exercise per Session:   Stress:   . Feeling of Stress :   Social Connections:   . Frequency of Communication with Friends and Family:   . Frequency of Social Gatherings with Friends and Family:   . Attends Religious Services:   . Active Member of Clubs or Organizations:   . Attends Archivist Meetings:   Marland Kitchen Marital Status:   Intimate Partner Violence:   . Fear of Current or Ex-Partner:   . Emotionally Abused:   Marland Kitchen Physically Abused:   . Sexually Abused:    Review of Systems  He doesn't sleep well when his mind is going---ruminates on things Appetite is fine     Objective:   Physical Exam  Constitutional: He appears well-developed. No distress.  Psychiatric: He has a normal mood and affect. His behavior is normal.  No depression Normal speech Seems to have reasonable insight and judgement Wife doesn't endorse loss of control           Assessment & Plan:

## 2020-02-24 NOTE — Assessment & Plan Note (Signed)
His wife doesn't endorse an explosive personality--but he ruminates and thinks about things It affects his sleep Discussed lack of clear indication for medication--but that it may help him settle down and let things go He does want to try something---will use low dose duloxetine

## 2020-03-02 ENCOUNTER — Ambulatory Visit: Payer: BC Managed Care – PPO | Admitting: Internal Medicine

## 2020-03-31 ENCOUNTER — Ambulatory Visit: Payer: BC Managed Care – PPO | Admitting: Internal Medicine

## 2020-04-01 ENCOUNTER — Telehealth: Payer: Self-pay | Admitting: Pulmonary Disease

## 2020-04-01 NOTE — Telephone Encounter (Signed)
Lmom x1

## 2020-04-01 NOTE — Telephone Encounter (Signed)
04/01/2020  Please contact the patient and let him know that we have reviewed his most recent CPAP compliance report.  Overall his compliance has improved.  This is good news.  Pressure settings are set at APAP 10-20.  How is he tolerating this?  His AHI is 1 which is well controlled.   No new recommendations  Patient can keep follow-up with our office which is scheduled in August/2021 with SG NP.  [name], FNP

## 2020-04-02 NOTE — Telephone Encounter (Signed)
ATC patient.  LMTCB. 

## 2020-04-02 NOTE — Telephone Encounter (Signed)
Patient called back and we spoke about his CPAP and his current settings. He said that he is doing great with his current settings and it is working really well for him. Encouraged patient to keep appointment with Judson Roch next month. Patient expressed understanding. Nothing further needed at this time.

## 2020-04-02 NOTE — Telephone Encounter (Signed)
Great glad to hear   Alex [name]

## 2020-04-05 ENCOUNTER — Ambulatory Visit: Payer: BC Managed Care – PPO | Admitting: Internal Medicine

## 2020-04-05 ENCOUNTER — Other Ambulatory Visit: Payer: Self-pay

## 2020-04-05 ENCOUNTER — Encounter: Payer: Self-pay | Admitting: Internal Medicine

## 2020-04-05 DIAGNOSIS — F39 Unspecified mood [affective] disorder: Secondary | ICD-10-CM | POA: Diagnosis not present

## 2020-04-05 NOTE — Assessment & Plan Note (Signed)
Anger, sleep issues, etc Feels better with duloxetine----and clearly having a job is helping (he needed to stay busy)

## 2020-04-05 NOTE — Progress Notes (Signed)
Subjective:    Patient ID: Alex [name], male    DOB: 1961/08/09, 59 y.o.   MRN: [id number]  HPI Here for follow up of mood issue This visit occurred during the SARS-CoV-2 public health emergency.  Safety protocols were in place, including screening questions prior to the visit, additional usage of staff PPE, and extensive cleaning of exam room while observing appropriate contact time as indicated for disinfecting solutions.   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 bisoprolol-hydrochlorothiazide (ZIAC) 2.5-6.25 MG tablet TAKE 1 TABLET BY MOUTH ONCE DAILY 90 tablet 3  . DULoxetine (CYMBALTA) 30 MG capsule Take 1 capsule (30 mg total) by mouth daily. 30 capsule 3  . sildenafil (REVATIO) 20 MG tablet TAKE 3-5 TABLETS BY MOUTH ONCE DAILY AS NEEDED 50 tablet 5   No current facility-administered medications on file prior to visit.    No Known Allergies  Past Medical History:  Diagnosis Date  . HTN (hypertension)   . OSA (obstructive sleep apnea)    PSG 09/23/10 AHI 22  . RBBB (right bundle branch block)     Past Surgical History:  Procedure Laterality Date  . APPENDECTOMY  1988  . fracture L arm    . TONSILLECTOMY    . VASECTOMY  1997     Family History  Problem Relation Age of Onset  . Stroke Father 36       doing okay  . Skin cancer Mother   . Stroke Paternal Grandfather        and PGGF  . Hypertension Other        dad's side   . Diabetes Neg Hx        DM   . Coronary artery disease Neg Hx   . Prostate cancer Neg Hx        no colon cancer   . Colon cancer Neg Hx     Social History   Socioeconomic History  . Marital status: Married    Spouse  name: Not on file  . Number of children: 2  . Years of education: Not on file  . Highest education level: Not on file  Occupational History  . 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 Smoking status: Never Smoker  . Smokeless tobacco: Former Systems developer    Types: Chew  . Tobacco comment: rare cigarillo   Substance and Sexual Activity  . Alcohol use: Yes    Comment: once monthly on average  . Drug use: No  . Sexual activity: Not on file  Other Topics Concern  . Not on file  Social History Narrative   Married, 2 children.   [employer].        Social Determinants of Health   Financial Resource Strain:   . Difficulty of Paying Living Expenses:   Food Insecurity:   . Worried About Charity fundraiser in the Last Year:   . Arboriculturist in the Last Year:   Transportation Needs:   . Film/video editor (Medical):   Marland Kitchen Lack of Transportation (Non-Medical):   Physical Activity:   . Days of Exercise  per Week:   . Minutes of Exercise per Session:   Stress:   . Feeling of Stress :   Social Connections:   . Frequency of Communication with Friends and Family:   . Frequency of Social Gatherings with Friends and Family:   . Attends Religious Services:   . Active Member of Clubs or Organizations:   . Attends Archivist Meetings:   Marland Kitchen Marital Status:   Intimate Partner Violence:   . Fear of Current or Ex-Partner:   . Emotionally Abused:   Marland Kitchen Physically Abused:   . Sexually Abused:    Review of Systems  Sleeping better now Still using the CPAP nightly--has gotten more used to it Appetite is always fine     Objective:   Physical Exam HENT:     Head: Normocephalic and atraumatic.  Psychiatric:        Mood and Affect: Mood normal.        Behavior: Behavior normal.            Assessment & Plan:

## 2020-05-17 ENCOUNTER — Ambulatory Visit: Payer: BC Managed Care – PPO | Admitting: Acute Care

## 2020-05-24 ENCOUNTER — Other Ambulatory Visit: Payer: Self-pay

## 2020-05-24 ENCOUNTER — Encounter: Payer: Self-pay | Admitting: Acute Care

## 2020-05-24 ENCOUNTER — Ambulatory Visit: Payer: BC Managed Care – PPO | Admitting: Acute Care

## 2020-05-24 VITALS — BP 130/70 | HR 76 | Temp 97.3°F | Ht 71.0 in | Wt 221.4 lb

## 2020-05-24 DIAGNOSIS — Z9989 Dependence on other enabling machines and devices: Secondary | ICD-10-CM

## 2020-05-24 DIAGNOSIS — G4733 Obstructive sleep apnea (adult) (pediatric): Secondary | ICD-10-CM

## 2020-05-24 DIAGNOSIS — Z0000 Encounter for general adult medical examination without abnormal findings: Secondary | ICD-10-CM

## 2020-05-24 NOTE — Progress Notes (Signed)
History of Present Illness Alex [name] is a 59 y.o. male with OSA on CPAP. He is followed by Dr. Elsworth Soho.   59 year old male former smoker followed in our office for obstructive sleep apnea.  Patient completed home sleep study in March/2021 that showed moderate obstructive sleep apnea and severe obstructive sleep apnea when supine.   PMH: Hypertension Smoker/ Smoking History: former smoker Maintenance: None  Pt of: Dr. Elsworth Soho  05/24/2020  Pt. Presents for follow up. Pt states he is doing well on his CPAP machine. He states he is wearing it most nights. He states he has had some nights when he fell asleep and did not put the mask on. He has great control of AHI  ( 0.8). Minimum pressure was increased 01/2020 and patient states hHe does have some daytime sleepiness. 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 He states he has had no issues with his machine or  equipment.   Test Results: Down Load 7/28-8/26/2021 Auto Set 10 cm-20 cm H2O Usage 23/30 days or 77% Average use days used 6 hours 32 minutes Average pressure 11.0-max of 13.5 cm H2O AHI 0.8  Epworth Score 12/01/2019>> 10 Epworth Score 05/24/2020>> 9  HST 3/31 showed moderate to severe OSA, average 23/hour >>>Was as bad as 40/hour when supine >>>Continue  auto CPAP 10-20 cm, mask of choice, follow up in 6 months with Judson Roch NP. [name] NP or Dr. Elsworth Soho  CBC Latest Ref Rng & Units 02/18/2020 10/03/2017 09/28/2016  WBC 4.0 - 10.5 K/uL 7.6 6.9 10.3  Hemoglobin 13.0 - 17.0 g/dL 14.9 14.9 15.6  Hematocrit 39 - 52 % 44.5 45.1 45.5  Platelets 150 - 400 K/uL 213.0 233.0 224.0    BMP Latest Ref Rng & Units 02/18/2020 10/03/2017 09/28/2016  Glucose 70 - 99 mg/dL 84 105(H) 86  BUN 6 - 23 mg/dL 16 13 12   Creatinine 0.40 - 1.50 mg/dL 1.07 1.05 1.23  Sodium 135 - 145 mEq/L 137 140 140  Potassium 3.5 - 5.1 mEq/L 3.8 4.5 4.1  Chloride 96 - 112 mEq/L 100 104 104  CO2 19 - 32 mEq/L 29 31 28   Calcium 8.4 -  10.5 mg/dL 10.1 9.4 9.9    BNP No results found for: BNP  ProBNP No results found for: PROBNP  PFT No results found for: FEV1PRE, FEV1POST, FVCPRE, FVCPOST, TLC, DLCOUNC, PREFEV1FVCRT, PSTFEV1FVCRT  No results found.   Past medical hx Past Medical History:  Diagnosis Date  . HTN (hypertension)   . OSA (obstructive sleep apnea)    PSG 09/23/10 AHI 22  . RBBB (right bundle branch block)      Social History   Tobacco Use  . Smoking status: Never Smoker  . Smokeless tobacco: Former Systems developer    Types: Chew  . Tobacco comment: rare cigarillo   Substance Use Topics  . Alcohol use: Yes    Comment: once monthly on average  . Drug use: No    Mr.Alonge reports that he has never smoked. He has quit using smokeless tobacco.  His smokeless tobacco use included chew. He reports current alcohol use. He reports that he does not use drugs.  Tobacco Cessation: Never smoker  Past surgical hx, Family hx, Social hx all reviewed.  Current Outpatient Medications on File Prior to Visit  Medication Sig  . bisoprolol-hydrochlorothiazide (ZIAC) 2.5-6.25 MG tablet TAKE 1 TABLET BY MOUTH ONCE DAILY  . DULoxetine (CYMBALTA) 30 MG capsule Take 1 capsule (30 mg  total) by mouth daily.  . sildenafil (REVATIO) 20 MG tablet TAKE 3-5 TABLETS BY MOUTH ONCE DAILY AS NEEDED   No current facility-administered medications on file prior to visit.     No Known Allergies  Review Of Systems:  Constitutional:   No  weight loss, night sweats,  Fevers, chills, fatigue, or  lassitude.  HEENT:   No headaches,  Difficulty swallowing,  Tooth/dental problems, or  Sore throat,                No sneezing, itching, ear ache, nasal congestion, post nasal drip,   CV:  No chest pain,  Orthopnea, PND, swelling in lower extremities, anasarca, dizziness, palpitations, syncope.   GI  No heartburn, indigestion, abdominal pain, nausea, vomiting, diarrhea, change in bowel habits, loss of appetite, bloody stools.   Resp:  No shortness of breath with exertion or at rest.  No excess mucus, no productive cough,  No non-productive cough,  No coughing up of blood.  No change in color of mucus.  No wheezing.  No chest wall deformity  Skin: no rash or lesions.  GU: no dysuria, change in color of urine, no urgency or frequency.  No flank pain, no hematuria   MS:  No joint pain or swelling.  No decreased range of motion.  No back pain.  Psych:  No change in mood or affect. No depression or anxiety.  No memory loss.   Vital Signs BP 130/70 (BP Location: Left Arm, Cuff Size: Normal)   Pulse 76   Temp (!) 97.3 F (36.3 C) (Oral)   Ht 5\' 11"  (1.803 m)   Wt 221 lb 6.4 oz (100.4 kg)   SpO2 98%   BMI 30.88 kg/m    Physical Exam:  General- No distress,  A&Ox3, pleasant ENT: No sinus tenderness, TM clear, pale nasal mucosa, no oral exudate,no post nasal drip, no LAN Cardiac: S1, S2, regular rate and rhythm, no murmur Chest: No wheeze/ rales/ dullness; no accessory muscle use, no nasal flaring, no sternal retractions Abd.: Soft Non-tender, ND, BS +, Body mass index is 30.88 kg/m. Ext: No clubbing cyanosis, edema Neuro:  normal strength, MAE x 4, A&O x 3 Skin: No rashes, o lesions, warm and dry Psych: normal mood and behavior   Assessment/Plan  OSA on CPAP Good control of AHI 0.8 Plan Keep up the great work with your CPAP machine. Continue on CPAP at bedtime. You appear to be benefiting from the treatment  Goal is to wear for at least 6 hours each night for maximal clinical benefit. Continue to work on weight loss, as the link between excess weight  and sleep apnea is well established.   Remember to establish a good bedtime routine, and work on sleep hygiene.  Limit daytime naps , avoid stimulants such as caffeine and nicotine close to bedtime, exercise daily to promote sleep quality, avoid heavy , spicy, fried , or rich foods before bed. Ensure adequate exposure to natural light during the day,establish a  relaxing bedtime routine with a pleasant sleep environment ( Bedroom between 60 and 67 degrees, turn off bright lights , TV or device screens screens , consider black out curtains or white noise machines) Do not drive if sleepy. Remember to clean mask, tubing, filter, and reservoir once weekly with soapy water.  Follow up with Dr. Elsworth Soho or Judson Roch NP   In 6 months  or before as needed.   Call if you need to see [place] sooner.  Please contact  office for sooner follow up if symptoms do not improve or worsen or seek emergency care   Currently unvaccinated for Covid 19 Plan Counseling done Please consider getting the Covid vaccine.     Magdalen Spatz, NP 05/24/2020  12:26 PM

## 2020-05-24 NOTE — Patient Instructions (Signed)
It is good to see you today. Keep up the great work with your CPAP machine. Continue on CPAP at bedtime. You appear to be benefiting from the treatment  Goal is to wear for at least 6 hours each night for maximal clinical benefit. Continue to work on weight loss, as the link between excess weight  and sleep apnea is well established.   Remember to establish a good bedtime routine, and work on sleep hygiene.  Limit daytime naps , avoid stimulants such as caffeine and nicotine close to bedtime, exercise daily to promote sleep quality, avoid heavy , spicy, fried , or rich foods before bed. Ensure adequate exposure to natural light during the day,establish a relaxing bedtime routine with a pleasant sleep environment ( Bedroom between 60 and 67 degrees, turn off bright lights , TV or device screens screens , consider black out curtains or white noise machines) Do not drive if sleepy. Remember to clean mask, tubing, filter, and reservoir once weekly with soapy water.  Follow up with Dr. Elsworth Soho or Judson Roch NP   In 6 months  or before as needed.   Call if you need to see [place] sooner.  Please contact office for sooner follow up if symptoms do not improve or worsen or seek emergency care   Please consider getting the Covid vaccine.

## 2020-06-25 ENCOUNTER — Other Ambulatory Visit: Payer: Self-pay | Admitting: Internal Medicine

## 2020-08-16 ENCOUNTER — Other Ambulatory Visit: Payer: Self-pay | Admitting: Internal Medicine

## 2021-02-28 ENCOUNTER — Encounter: Payer: BC Managed Care – PPO | Admitting: Internal Medicine

## 2021-03-14 ENCOUNTER — Encounter: Payer: Self-pay | Admitting: Internal Medicine

## 2021-03-14 ENCOUNTER — Other Ambulatory Visit: Payer: Self-pay

## 2021-03-14 ENCOUNTER — Other Ambulatory Visit: Payer: Self-pay | Admitting: Internal Medicine

## 2021-03-14 ENCOUNTER — Ambulatory Visit (INDEPENDENT_AMBULATORY_CARE_PROVIDER_SITE_OTHER): Payer: Self-pay | Admitting: Internal Medicine

## 2021-03-14 VITALS — BP 140/86 | HR 88 | Temp 97.5°F | Ht 73.0 in | Wt 226.0 lb

## 2021-03-14 DIAGNOSIS — I10 Essential (primary) hypertension: Secondary | ICD-10-CM

## 2021-03-14 DIAGNOSIS — Z0000 Encounter for general adult medical examination without abnormal findings: Secondary | ICD-10-CM

## 2021-03-14 DIAGNOSIS — Z23 Encounter for immunization: Secondary | ICD-10-CM

## 2021-03-14 DIAGNOSIS — R972 Elevated prostate specific antigen [PSA]: Secondary | ICD-10-CM

## 2021-03-14 DIAGNOSIS — K219 Gastro-esophageal reflux disease without esophagitis: Secondary | ICD-10-CM | POA: Insufficient documentation

## 2021-03-14 DIAGNOSIS — G4733 Obstructive sleep apnea (adult) (pediatric): Secondary | ICD-10-CM

## 2021-03-14 DIAGNOSIS — F39 Unspecified mood [affective] disorder: Secondary | ICD-10-CM

## 2021-03-14 MED ORDER — FAMOTIDINE 40 MG PO TABS: 40.0000 mg | ORAL_TABLET | Freq: Every day | ORAL | 3 refills | Status: DC

## 2021-03-14 NOTE — Assessment & Plan Note (Signed)
Will check free PSA today

## 2021-03-14 NOTE — Assessment & Plan Note (Signed)
Regular symptoms Will start bedtime famotidine

## 2021-03-14 NOTE — Assessment & Plan Note (Signed)
Healthy Advised COVID vaccine--he will go to pharmacy Flu vaccine in the fall shingrix here Colon due 2025 Discussed resistance training

## 2021-03-14 NOTE — Assessment & Plan Note (Signed)
Anger, etc controlled with duloxetine

## 2021-03-14 NOTE — Progress Notes (Signed)
Subjective:    Patient ID: Alex [name], male    DOB: 05-27-1961, 60 y.o.   MRN: [id number]  HPI Here for physical This visit occurred during the SARS-CoV-2 public health emergency.  Safety protocols were in place, including screening questions prior to the visit, additional usage of staff PPE, and extensive cleaning of exam room while observing appropriate contact time as indicated for disinfecting solutions.   Doing well Still doing surveying work Feels his leg strength is better No other exercise---"no desire to"--but stays very active  Still will have brief anger spells---but only brief and 'it just washes away" No temper issues still  Continues on the BP med  Current Outpatient Medications on File Prior to Visit  Medication Sig Dispense Refill   DULoxetine (CYMBALTA) 30 MG capsule TAKE 1 CAPSULE BY MOUTH ONCE DAILY 30 capsule 9   sildenafil (REVATIO) 20 MG tablet TAKE 3-5 TABLETS BY MOUTH ONCE DAILY AS NEEDED 50 tablet 11   No current facility-administered medications on file prior to visit.    No Known Allergies  Past Medical History:  Diagnosis Date   HTN (hypertension)    OSA (obstructive sleep apnea)    PSG 09/23/10 AHI 22   RBBB (right bundle branch block)     Past Surgical History:  Procedure Laterality Date   APPENDECTOMY  1988   fracture L arm     TONSILLECTOMY     VASECTOMY  1997     Family History  Problem Relation Age of Onset   Stroke Father 27       doing okay   Skin cancer Mother    Stroke Paternal Grandfather        and PGGF   Hypertension Other        dad's side    Diabetes Neg Hx        DM    Coronary artery disease Neg Hx    Prostate cancer Neg Hx        no colon cancer    Colon cancer Neg Hx     Social History   Socioeconomic History   Marital status: Married    Spouse name: Not on file   Number of children: 2   Years of education: Not on file   Highest education level: Not on file  Occupational History   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   [employer].        Social Determinants of Health   Financial Resource Strain: Not on file  Food Insecurity: Not on file  Transportation Needs: Not on file  Physical Activity: Not on file  Stress: Not on file  Social Connections: Not on file  Intimate Partner Violence: Not on file   Review of Systems  Constitutional:  Negative for fatigue and unexpected weight change.       Wears seat belt  HENT:  Positive for tinnitus. Negative for trouble swallowing.  Some hearing loss Overdue for dentist---going soon  Eyes:  Negative for visual disturbance.       No diplopia or unilateral vision loss  Respiratory:  Negative for cough, chest tightness and shortness of breath.   Cardiovascular:  Negative for chest pain, palpitations and leg swelling.  Gastrointestinal:  Negative for blood in stool and constipation.       Occasional heartburn---alka seltzer helps (every other day or so)  Endocrine: Negative for polydipsia and polyuria.  Genitourinary:  Negative for urgency.       Slower urine stream--no major issues Satisfied with sildenafil  Musculoskeletal:  Negative for arthralgias and joint swelling.       Some chronic back problems---from injury doing dead lifts in gym many years ago. X-ray last year okay---except aortic atherosclerosis  Skin:  Negative for rash.       No suspicious skin lesions  Allergic/Immunologic: Positive for environmental allergies. Negative for immunocompromised state.       Mild symptoms with tree pollen---cough.  No meds  Neurological:  Negative for dizziness, syncope, light-headedness and headaches.  Hematological:  Negative for adenopathy. Does not bruise/bleed easily.  Psychiatric/Behavioral:  Negative for dysphoric mood and sleep disturbance. The patient is not nervous/anxious.        CPAP nightly      Objective:   Physical Exam Constitutional:      Appearance: Normal appearance.  HENT:     Right Ear: Tympanic membrane and ear canal normal.     Left Ear: Tympanic membrane and ear canal normal.     Mouth/Throat:     Pharynx: No oropharyngeal exudate or posterior oropharyngeal erythema.  Eyes:     Conjunctiva/sclera: Conjunctivae normal.     Pupils: Pupils are equal, round, and reactive to light.  Cardiovascular:     Rate and Rhythm: Normal rate and regular rhythm.     Pulses: Normal pulses.     Heart sounds: No murmur heard.   No gallop.  Pulmonary:     Effort: Pulmonary effort is normal.     Breath sounds: Normal breath sounds. No wheezing or rales.  Abdominal:     Palpations: Abdomen is soft.     Tenderness: There is no abdominal tenderness.  Musculoskeletal:     Cervical back: Neck supple.     Right lower leg: No edema.     Left lower leg: No edema.     Comments: Normal ROM in hips SLR negative  Lymphadenopathy:     Cervical: No cervical adenopathy.  Skin:    General: Skin is warm.     Findings: No rash.  Neurological:     General: No focal deficit present.     Mental Status: He is alert and oriented to person, place, and time.  Psychiatric:        Mood and Affect: Mood normal.        Behavior: Behavior normal.           Assessment & Plan:

## 2021-03-14 NOTE — Addendum Note (Signed)
Addended by: Pilar Grammes on: 03/14/2021 04:16 PM   Modules accepted: Orders

## 2021-03-14 NOTE — Assessment & Plan Note (Signed)
BP Readings from Last 3 Encounters:  03/14/21 140/86  05/24/20 130/70  04/05/20 (!) 152/80   Control okay with bisoprolol/HCTZ

## 2021-03-14 NOTE — Assessment & Plan Note (Signed)
Uses the CPAP

## 2021-03-15 LAB — COMPREHENSIVE METABOLIC PANEL
ALT: 20 U/L (ref 0–53)
AST: 17 U/L (ref 0–37)
Albumin: 4.5 g/dL (ref 3.5–5.2)
Alkaline Phosphatase: 91 U/L (ref 39–117)
BUN: 23 mg/dL (ref 6–23)
CO2: 28 mEq/L (ref 19–32)
Calcium: 9.9 mg/dL (ref 8.4–10.5)
Chloride: 106 mEq/L (ref 96–112)
Creatinine, Ser: 1.23 mg/dL (ref 0.40–1.50)
GFR: 64.71 mL/min (ref >60.00)
Glucose, Bld: 96 mg/dL (ref 70–99)
Potassium: 4.3 mEq/L (ref 3.5–5.1)
Sodium: 141 mEq/L (ref 135–145)
Total Bilirubin: 0.9 mg/dL (ref 0.2–1.2)
Total Protein: 6.9 g/dL (ref 6.0–8.3)

## 2021-03-15 LAB — PSA, TOTAL AND FREE
PSA, % Free: 8 % (calc) — ABNORMAL LOW (ref >25)
PSA, Free: 0.3 ng/mL
PSA, Total: 3.7 ng/mL (ref <=4.0)

## 2021-03-15 LAB — CBC
HCT: 42.6 % (ref 39.0–52.0)
Hemoglobin: 14.3 g/dL (ref 13.0–17.0)
MCHC: 33.4 g/dL (ref 30.0–36.0)
MCV: 86.9 fl (ref 78.0–100.0)
Platelets: 203 10*3/uL (ref 150.0–400.0)
RBC: 4.91 Mil/uL (ref 4.22–5.81)
RDW: 13.4 % (ref 11.5–15.5)
WBC: 7.4 10*3/uL (ref 4.0–10.5)

## 2021-03-26 ENCOUNTER — Other Ambulatory Visit: Payer: Self-pay | Admitting: Internal Medicine

## 2021-05-24 ENCOUNTER — Ambulatory Visit: Payer: Self-pay

## 2021-06-02 ENCOUNTER — Other Ambulatory Visit: Payer: Self-pay

## 2021-06-02 ENCOUNTER — Inpatient Hospital Stay
Admission: EM | Admit: 2021-06-02 | Discharge: 2021-06-04 | DRG: 247 | Disposition: A | Discharge status: Home / Self Care | Payer: BC Managed Care – PPO | Attending: Internal Medicine | Admitting: Internal Medicine

## 2021-06-02 ENCOUNTER — Encounter
Admission: EM | Disposition: A | Discharge status: Home / Self Care | Payer: Self-pay | Source: Home / Self Care | Attending: Internal Medicine

## 2021-06-02 ENCOUNTER — Emergency Department: Payer: BC Managed Care – PPO

## 2021-06-02 DIAGNOSIS — D72829 Elevated white blood cell count, unspecified: Secondary | ICD-10-CM | POA: Diagnosis present

## 2021-06-02 DIAGNOSIS — I472 Ventricular tachycardia: Secondary | ICD-10-CM | POA: Diagnosis not present

## 2021-06-02 DIAGNOSIS — I2102 ST elevation (STEMI) myocardial infarction involving left anterior descending coronary artery: Secondary | ICD-10-CM | POA: Diagnosis present

## 2021-06-02 DIAGNOSIS — Z823 Family history of stroke: Secondary | ICD-10-CM

## 2021-06-02 DIAGNOSIS — K219 Gastro-esophageal reflux disease without esophagitis: Secondary | ICD-10-CM | POA: Diagnosis present

## 2021-06-02 DIAGNOSIS — I493 Ventricular premature depolarization: Secondary | ICD-10-CM | POA: Diagnosis present

## 2021-06-02 DIAGNOSIS — Z79899 Other long term (current) drug therapy: Secondary | ICD-10-CM

## 2021-06-02 DIAGNOSIS — Z833 Family history of diabetes mellitus: Secondary | ICD-10-CM

## 2021-06-02 DIAGNOSIS — E876 Hypokalemia: Secondary | ICD-10-CM | POA: Diagnosis not present

## 2021-06-02 DIAGNOSIS — G4733 Obstructive sleep apnea (adult) (pediatric): Secondary | ICD-10-CM | POA: Diagnosis present

## 2021-06-02 DIAGNOSIS — Z20822 Contact with and (suspected) exposure to covid-19: Secondary | ICD-10-CM | POA: Diagnosis present

## 2021-06-02 DIAGNOSIS — I25110 Atherosclerotic heart disease of native coronary artery with unstable angina pectoris: Secondary | ICD-10-CM | POA: Diagnosis present

## 2021-06-02 DIAGNOSIS — Z7902 Long term (current) use of antithrombotics/antiplatelets: Secondary | ICD-10-CM | POA: Diagnosis not present

## 2021-06-02 DIAGNOSIS — Z720 Tobacco use: Secondary | ICD-10-CM | POA: Diagnosis not present

## 2021-06-02 DIAGNOSIS — E785 Hyperlipidemia, unspecified: Secondary | ICD-10-CM | POA: Diagnosis present

## 2021-06-02 DIAGNOSIS — Z8249 Family history of ischemic heart disease and other diseases of the circulatory system: Secondary | ICD-10-CM

## 2021-06-02 DIAGNOSIS — I10 Essential (primary) hypertension: Secondary | ICD-10-CM | POA: Diagnosis present

## 2021-06-02 DIAGNOSIS — I213 ST elevation (STEMI) myocardial infarction of unspecified site: Secondary | ICD-10-CM | POA: Diagnosis present

## 2021-06-02 DIAGNOSIS — I959 Hypotension, unspecified: Secondary | ICD-10-CM | POA: Diagnosis not present

## 2021-06-02 DIAGNOSIS — I200 Unstable angina: Secondary | ICD-10-CM

## 2021-06-02 HISTORY — PX: CORONARY/GRAFT ACUTE MI REVASCULARIZATION: CATH118305

## 2021-06-02 HISTORY — PX: LEFT HEART CATH AND CORONARY ANGIOGRAPHY: CATH118249

## 2021-06-02 LAB — HEPATIC FUNCTION PANEL
ALT: 26 U/L (ref 0–44)
AST: 36 U/L (ref 15–41)
Albumin: 4.4 g/dL (ref 3.5–5.0)
Alkaline Phosphatase: 85 U/L (ref 38–126)
Bilirubin, Direct: 0.2 mg/dL (ref 0.0–0.2)
Indirect Bilirubin: 1.7 mg/dL — ABNORMAL HIGH (ref 0.3–0.9)
Total Bilirubin: 1.9 mg/dL — ABNORMAL HIGH (ref 0.3–1.2)
Total Protein: 7.3 g/dL (ref 6.5–8.1)

## 2021-06-02 LAB — BASIC METABOLIC PANEL
Anion gap: 13 (ref 5–15)
BUN: 18 mg/dL (ref 6–20)
CO2: 20 mmol/L — ABNORMAL LOW (ref 22–32)
Calcium: 9.6 mg/dL (ref 8.9–10.3)
Chloride: 102 mmol/L (ref 98–111)
Creatinine, Ser: 1.16 mg/dL (ref 0.61–1.24)
GFR, Estimated: >60 mL/min (ref >60)
Glucose, Bld: 155 mg/dL — ABNORMAL HIGH (ref 70–99)
Potassium: 3.6 mmol/L (ref 3.5–5.1)
Sodium: 135 mmol/L (ref 135–145)

## 2021-06-02 LAB — CBC
HCT: 48.2 % (ref 39.0–52.0)
Hemoglobin: 16.3 g/dL (ref 13.0–17.0)
MCH: 28.8 pg (ref 26.0–34.0)
MCHC: 33.8 g/dL (ref 30.0–36.0)
MCV: 85.2 fL (ref 80.0–100.0)
Platelets: 267 10*3/uL (ref 150–400)
RBC: 5.66 MIL/uL (ref 4.22–5.81)
RDW: 12.5 % (ref 11.5–15.5)
WBC: 12 10*3/uL — ABNORMAL HIGH (ref 4.0–10.5)
nRBC: 0 % (ref 0.0–0.2)

## 2021-06-02 LAB — TROPONIN I (HIGH SENSITIVITY)
Troponin I (High Sensitivity): 595 ng/L (ref <18)
Troponin I (High Sensitivity): 558 ng/L (ref <18)
Troponin I (High Sensitivity): >24000 ng/L (ref <18)
Troponin I (High Sensitivity): >24000 ng/L (ref <18)

## 2021-06-02 LAB — MRSA NEXT GEN BY PCR, NASAL: MRSA by PCR Next Gen: NOT DETECTED

## 2021-06-02 LAB — POCT ACTIVATED CLOTTING TIME
Activated Clotting Time: 190 seconds
Activated Clotting Time: 300 seconds
Activated Clotting Time: 271 seconds

## 2021-06-02 LAB — LIPASE, BLOOD: Lipase: 30 U/L (ref 11–51)

## 2021-06-02 LAB — PROTIME-INR
INR: 1 (ref 0.8–1.2)
Prothrombin Time: 13.3 seconds (ref 11.4–15.2)

## 2021-06-02 LAB — APTT: aPTT: 38 seconds — ABNORMAL HIGH (ref 24–36)

## 2021-06-02 LAB — RESP PANEL BY RT-PCR (FLU A&B, COVID) ARPGX2
Influenza A by PCR: NEGATIVE
Influenza B by PCR: NEGATIVE
SARS Coronavirus 2 by RT PCR: NEGATIVE

## 2021-06-02 LAB — GLUCOSE, CAPILLARY: Glucose-Capillary: 147 mg/dL — ABNORMAL HIGH (ref 70–99)

## 2021-06-02 SURGERY — LEFT HEART CATH AND CORONARY ANGIOGRAPHY
Anesthesia: Moderate Sedation

## 2021-06-02 SURGERY — CORONARY/GRAFT ACUTE MI REVASCULARIZATION
Anesthesia: Moderate Sedation

## 2021-06-02 MED ORDER — ASPIRIN 81 MG PO CHEW
CHEWABLE_TABLET | ORAL | Status: AC
  Filled 2021-06-02: qty 4

## 2021-06-02 MED ORDER — FAMOTIDINE 20 MG PO TABS
40.0000 mg | ORAL_TABLET | Freq: Every day | ORAL | Status: DC
  Administered 2021-06-02 – 2021-06-03 (×2): 40 mg via ORAL
  Filled 2021-06-02 (×2): qty 2

## 2021-06-02 MED ORDER — TIROFIBAN HCL IV 12.5 MG/250 ML
INTRAVENOUS | Status: AC
  Filled 2021-06-02: qty 250

## 2021-06-02 MED ORDER — NOREPINEPHRINE BITARTRATE 1 MG/ML IV SOLN
INTRAVENOUS | Status: AC | PRN
  Administered 2021-06-02: 10 ug/min via INTRAVENOUS

## 2021-06-02 MED ORDER — TICAGRELOR 90 MG PO TABS
ORAL_TABLET | ORAL | Status: AC
  Filled 2021-06-02: qty 2

## 2021-06-02 MED ORDER — FENTANYL CITRATE PF 50 MCG/ML IJ SOSY
PREFILLED_SYRINGE | INTRAMUSCULAR | Status: AC
  Filled 2021-06-02: qty 1

## 2021-06-02 MED ORDER — FENTANYL CITRATE (PF) 100 MCG/2ML IJ SOLN
INTRAMUSCULAR | Status: DC | PRN
  Administered 2021-06-02 (×2): 50 ug via INTRAVENOUS

## 2021-06-02 MED ORDER — NOREPINEPHRINE BITARTRATE 1 MG/ML IV SOLN
INTRAVENOUS | Status: AC
  Filled 2021-06-02: qty 4

## 2021-06-02 MED ORDER — HEPARIN SODIUM (PORCINE) 1000 UNIT/ML IJ SOLN
INTRAMUSCULAR | Status: AC
  Filled 2021-06-02: qty 1

## 2021-06-02 MED ORDER — SODIUM CHLORIDE 0.9% FLUSH: 3.0000 mL | INTRAVENOUS | Status: DC | PRN

## 2021-06-02 MED ORDER — ALPRAZOLAM 0.25 MG PO TABS
0.2500 mg | ORAL_TABLET | Freq: Two times a day (BID) | ORAL | Status: DC | PRN
  Administered 2021-06-03 (×2): 0.25 mg via ORAL
  Filled 2021-06-02 (×2): qty 1

## 2021-06-02 MED ORDER — TIROFIBAN (AGGRASTAT) BOLUS VIA INFUSION
INTRAVENOUS | Status: DC | PRN
  Administered 2021-06-02: 2495 ug via INTRAVENOUS

## 2021-06-02 MED ORDER — ATORVASTATIN CALCIUM 80 MG PO TABS
80.0000 mg | ORAL_TABLET | Freq: Every day | ORAL | Status: DC
  Administered 2021-06-02 – 2021-06-04 (×3): 80 mg via ORAL
  Filled 2021-06-02: qty 1
  Filled 2021-06-02 (×2): qty 4

## 2021-06-02 MED ORDER — SODIUM CHLORIDE 0.9% FLUSH
3.0000 mL | Freq: Two times a day (BID) | INTRAVENOUS | Status: DC
  Administered 2021-06-02 – 2021-06-04 (×5): 3 mL via INTRAVENOUS

## 2021-06-02 MED ORDER — HEPARIN (PORCINE) IN NACL 1000-0.9 UT/500ML-% IV SOLN
INTRAVENOUS | Status: DC | PRN
  Administered 2021-06-02 (×2): 500 mL

## 2021-06-02 MED ORDER — NITROGLYCERIN 0.4 MG SL SUBL
0.4000 mg | SUBLINGUAL_TABLET | SUBLINGUAL | Status: DC | PRN
  Administered 2021-06-02 (×3): 0.4 mg via SUBLINGUAL

## 2021-06-02 MED ORDER — ASPIRIN 81 MG PO CHEW
324.0000 mg | CHEWABLE_TABLET | Freq: Once | ORAL | Status: AC
  Administered 2021-06-02: 324 mg via ORAL
  Filled 2021-06-02: qty 4

## 2021-06-02 MED ORDER — ACETAMINOPHEN 325 MG PO TABS
650.0000 mg | ORAL_TABLET | ORAL | Status: DC | PRN
  Administered 2021-06-03: 650 mg via ORAL
  Filled 2021-06-02: qty 2

## 2021-06-02 MED ORDER — OXYCODONE HCL 5 MG PO TABS
5.0000 mg | ORAL_TABLET | ORAL | Status: DC | PRN
  Administered 2021-06-03: 5 mg via ORAL
  Filled 2021-06-02: qty 1

## 2021-06-02 MED ORDER — ASPIRIN 81 MG PO CHEW
81.0000 mg | CHEWABLE_TABLET | Freq: Every day | ORAL | Status: DC
  Administered 2021-06-03 – 2021-06-04 (×2): 81 mg via ORAL
  Filled 2021-06-02 (×2): qty 1

## 2021-06-02 MED ORDER — VERAPAMIL HCL 2.5 MG/ML IV SOLN
INTRAVENOUS | Status: DC | PRN
  Administered 2021-06-02: 2.5 mg via INTRAVENOUS

## 2021-06-02 MED ORDER — LIDOCAINE HCL (PF) 1 % IJ SOLN
INTRAMUSCULAR | Status: DC | PRN
  Administered 2021-06-02: 2 mL

## 2021-06-02 MED ORDER — HEPARIN (PORCINE) IN NACL 1000-0.9 UT/500ML-% IV SOLN
INTRAVENOUS | Status: AC
  Filled 2021-06-02: qty 1000

## 2021-06-02 MED ORDER — DIAZEPAM 2 MG PO TABS: 2.0000 mg | ORAL_TABLET | ORAL | Status: DC | PRN

## 2021-06-02 MED ORDER — MIDAZOLAM HCL 2 MG/2ML IJ SOLN
INTRAMUSCULAR | Status: DC | PRN
  Administered 2021-06-02 (×2): 1 mg via INTRAVENOUS

## 2021-06-02 MED ORDER — LOSARTAN POTASSIUM 25 MG PO TABS
25.0000 mg | ORAL_TABLET | Freq: Every day | ORAL | Status: DC
  Administered 2021-06-02 – 2021-06-04 (×3): 25 mg via ORAL
  Filled 2021-06-02 (×4): qty 1

## 2021-06-02 MED ORDER — ONDANSETRON HCL 4 MG/2ML IJ SOLN: 4.0000 mg | Freq: Once | INTRAMUSCULAR | Status: AC

## 2021-06-02 MED ORDER — SODIUM CHLORIDE 0.9% FLUSH
3.0000 mL | Freq: Two times a day (BID) | INTRAVENOUS | Status: DC
  Administered 2021-06-03 – 2021-06-04 (×4): 3 mL via INTRAVENOUS

## 2021-06-02 MED ORDER — LABETALOL HCL 5 MG/ML IV SOLN: 10.0000 mg | INTRAVENOUS | Status: AC | PRN

## 2021-06-02 MED ORDER — METOPROLOL TARTRATE 25 MG PO TABS
25.0000 mg | ORAL_TABLET | Freq: Two times a day (BID) | ORAL | Status: DC
  Administered 2021-06-02 – 2021-06-04 (×5): 25 mg via ORAL
  Filled 2021-06-02 (×5): qty 1

## 2021-06-02 MED ORDER — IOHEXOL 350 MG/ML SOLN
INTRAVENOUS | Status: DC | PRN
  Administered 2021-06-02: 246 mL

## 2021-06-02 MED ORDER — HYDRALAZINE HCL 20 MG/ML IJ SOLN
10.0000 mg | Freq: Once | INTRAMUSCULAR | Status: AC
  Administered 2021-06-02: 10 mg via INTRAVENOUS
  Filled 2021-06-02: qty 1

## 2021-06-02 MED ORDER — TICAGRELOR 90 MG PO TABS
90.0000 mg | ORAL_TABLET | Freq: Two times a day (BID) | ORAL | Status: DC
  Administered 2021-06-02 – 2021-06-04 (×4): 90 mg via ORAL
  Filled 2021-06-02 (×4): qty 1

## 2021-06-02 MED ORDER — FENTANYL CITRATE PF 50 MCG/ML IJ SOSY
50.0000 ug | PREFILLED_SYRINGE | INTRAMUSCULAR | Status: DC | PRN
  Administered 2021-06-02: 50 ug via INTRAVENOUS
  Filled 2021-06-02: qty 1

## 2021-06-02 MED ORDER — ASPIRIN 81 MG PO CHEW
CHEWABLE_TABLET | ORAL | Status: DC | PRN
  Administered 2021-06-02: 324 mg via ORAL

## 2021-06-02 MED ORDER — VERAPAMIL HCL 2.5 MG/ML IV SOLN
INTRAVENOUS | Status: AC
  Filled 2021-06-02: qty 2

## 2021-06-02 MED ORDER — ZOLPIDEM TARTRATE 5 MG PO TABS: 5.0000 mg | ORAL_TABLET | Freq: Every evening | ORAL | Status: DC | PRN

## 2021-06-02 MED ORDER — TICAGRELOR 90 MG PO TABS
ORAL_TABLET | ORAL | Status: DC | PRN
  Administered 2021-06-02: 180 mg via ORAL

## 2021-06-02 MED ORDER — ONDANSETRON HCL 4 MG/2ML IJ SOLN
INTRAMUSCULAR | Status: AC
  Administered 2021-06-02: 4 mg via INTRAVENOUS
  Filled 2021-06-02: qty 2

## 2021-06-02 MED ORDER — ONDANSETRON HCL 4 MG/2ML IJ SOLN
4.0000 mg | Freq: Four times a day (QID) | INTRAMUSCULAR | Status: DC | PRN
  Administered 2021-06-02: 4 mg via INTRAVENOUS
  Filled 2021-06-02: qty 2

## 2021-06-02 MED ORDER — SODIUM CHLORIDE 0.9 % IV SOLN
INTRAVENOUS | Status: AC | PRN
  Administered 2021-06-02: 250 mL via INTRAVENOUS
  Administered 2021-06-02: 100 mL/h via INTRAVENOUS

## 2021-06-02 MED ORDER — SODIUM CHLORIDE 0.9 % IV SOLN: 250.0000 mL | INTRAVENOUS | Status: DC | PRN

## 2021-06-02 MED ORDER — SODIUM CHLORIDE 0.9 % WEIGHT BASED INFUSION
1.0000 mL/kg/h | INTRAVENOUS | Status: DC
  Administered 2021-06-02: 1 mL/kg/h via INTRAVENOUS

## 2021-06-02 MED ORDER — LIDOCAINE HCL 1 % IJ SOLN
INTRAMUSCULAR | Status: AC
  Filled 2021-06-02: qty 20

## 2021-06-02 MED ORDER — HEPARIN SODIUM (PORCINE) 1000 UNIT/ML IJ SOLN
INTRAMUSCULAR | Status: DC | PRN
  Administered 2021-06-02: 2500 [IU] via INTRAVENOUS
  Administered 2021-06-02: 5000 [IU] via INTRAVENOUS
  Administered 2021-06-02: 2500 [IU] via INTRAVENOUS
  Administered 2021-06-02: 5000 [IU] via INTRAVENOUS

## 2021-06-02 MED ORDER — NOREPINEPHRINE 4 MG/250ML-% IV SOLN: 0.0000 ug/min | INTRAVENOUS | Status: DC

## 2021-06-02 MED ORDER — ATROPINE SULFATE 1 MG/10ML IJ SOSY
PREFILLED_SYRINGE | INTRAMUSCULAR | Status: AC
  Filled 2021-06-02: qty 10

## 2021-06-02 MED ORDER — TIROFIBAN HCL IV 12.5 MG/250 ML
0.1500 ug/kg/min | INTRAVENOUS | Status: DC
  Administered 2021-06-02 – 2021-06-03 (×2): 0.15 ug/kg/min via INTRAVENOUS
  Filled 2021-06-02: qty 250

## 2021-06-02 MED ORDER — FENTANYL CITRATE PF 50 MCG/ML IJ SOSY
50.0000 ug | PREFILLED_SYRINGE | Freq: Once | INTRAMUSCULAR | Status: AC
  Administered 2021-06-02: 50 ug via INTRAVENOUS
  Filled 2021-06-02: qty 1

## 2021-06-02 MED ORDER — IOHEXOL 350 MG/ML SOLN
100.0000 mL | Freq: Once | INTRAVENOUS | Status: AC | PRN
  Administered 2021-06-02: 100 mL via INTRAVENOUS

## 2021-06-02 MED ORDER — INFLUENZA VAC SPLIT QUAD 0.5 ML IM SUSY
0.5000 mL | PREFILLED_SYRINGE | INTRAMUSCULAR | Status: DC
  Filled 2021-06-02: qty 0.5

## 2021-06-02 MED ORDER — ACETAMINOPHEN 500 MG PO TABS
1000.0000 mg | ORAL_TABLET | Freq: Once | ORAL | Status: AC
  Administered 2021-06-02: 1000 mg via ORAL
  Filled 2021-06-02: qty 2

## 2021-06-02 MED ORDER — HEPARIN (PORCINE) 25000 UT/250ML-% IV SOLN
1300.0000 [IU]/h | INTRAVENOUS | Status: DC
  Administered 2021-06-02: 1300 [IU]/h via INTRAVENOUS
  Filled 2021-06-02: qty 250

## 2021-06-02 MED ORDER — NITROGLYCERIN IN D5W 200-5 MCG/ML-% IV SOLN
0.0000 ug/min | INTRAVENOUS | Status: DC
  Administered 2021-06-02: 40 ug/min via INTRAVENOUS
  Filled 2021-06-02: qty 250

## 2021-06-02 MED ORDER — SODIUM CHLORIDE 0.9 % IV BOLUS
1000.0000 mL | Freq: Once | INTRAVENOUS | Status: AC
  Administered 2021-06-02: 1000 mL via INTRAVENOUS

## 2021-06-02 MED ORDER — HEPARIN BOLUS VIA INFUSION
4000.0000 [IU] | Freq: Once | INTRAVENOUS | Status: AC
  Administered 2021-06-02: 4000 [IU] via INTRAVENOUS
  Filled 2021-06-02: qty 4000

## 2021-06-02 MED ORDER — FAMOTIDINE 20 MG IN NS 100 ML IVPB
20.0000 mg | Freq: Once | INTRAVENOUS | Status: AC
  Administered 2021-06-02: 20 mg via INTRAVENOUS

## 2021-06-02 MED ORDER — TIROFIBAN HCL IV 12.5 MG/250 ML
INTRAVENOUS | Status: AC | PRN
  Administered 2021-06-02: .15 ug/kg/min via INTRAVENOUS

## 2021-06-02 MED ORDER — HYDRALAZINE HCL 20 MG/ML IJ SOLN
10.0000 mg | INTRAMUSCULAR | Status: AC | PRN
  Administered 2021-06-02: 10 mg via INTRAVENOUS
  Filled 2021-06-02: qty 1

## 2021-06-02 MED ORDER — MIDAZOLAM HCL 2 MG/2ML IJ SOLN
INTRAMUSCULAR | Status: AC
  Filled 2021-06-02: qty 2

## 2021-06-02 SURGICAL SUPPLY — 24 items
BALLN TREK RX 2.5X12 (BALLOONS) ×2
BALLN TREK RX 2.5X15 (BALLOONS) ×2
BALLN TREK RX 3.0X15 (BALLOONS) ×2
BALLOON TREK RX 2.5X12 (BALLOONS) IMPLANT
BALLOON TREK RX 2.5X15 (BALLOONS) IMPLANT
BALLOON TREK RX 3.0X15 (BALLOONS) IMPLANT
CATH INFINITI 5 FR JL3.5 (CATHETERS) ×1 IMPLANT
CATH INFINITI JR4 5F (CATHETERS) ×1 IMPLANT
CATH VISTA GUIDE 6FR XB3.5 (CATHETERS) ×1 IMPLANT
DEVICE RAD TR BAND REGULAR (VASCULAR PRODUCTS) ×1 IMPLANT
DRAPE BRACHIAL (DRAPES) ×1 IMPLANT
GLIDESHEATH SLEND SS 6F .021 (SHEATH) ×1 IMPLANT
GUIDEWIRE INQWIRE 1.5J.035X260 (WIRE) IMPLANT
INQWIRE 1.5J .035X260CM (WIRE) ×2
KIT ENCORE 26 ADVANTAGE (KITS) ×1 IMPLANT
PACK CARDIAC CATH (CUSTOM PROCEDURE TRAY) ×2 IMPLANT
PROTECTION STATION PRESSURIZED (MISCELLANEOUS) ×2
SET ATX SIMPLICITY (MISCELLANEOUS) ×1 IMPLANT
STATION PROTECTION PRESSURIZED (MISCELLANEOUS) IMPLANT
STENT ONYX FRONTIER 3.0X34 (Permanent Stent) ×1 IMPLANT
TUBING CIL FLEX 10 FLL-RA (TUBING) ×1 IMPLANT
WIRE G HI TQ BMW 190 (WIRE) ×1 IMPLANT
WIRE G INSERTION TOOL (WIRE) ×1 IMPLANT
WIRE HI TORQ TRAVERSE ST 190CM (WIRE) ×1 IMPLANT

## 2021-06-02 NOTE — ED Provider Notes (Signed)
[facility] Emergency Department Provider Note  ____________________________________________  Time seen: Approximately 11:23 AM  I have reviewed the triage vital signs and the nursing notes.   HISTORY  Chief Complaint Chest Pain    HPI Alex [name] is a 60 y.o. male with a history of hypertension, hyperlipidemia who comes ED complaining of central chest pain radiating to left arm associated with shortness of breath vomiting and diaphoresis that started last night around 3 AM.  Its intermittent, lasting about 30 minutes at a time.  He drove to the hospital last night but by the time he arrived to the parking lot it had resolved so he went home again.  Pain reoccurred this morning when he was getting out of bed, and he reports the more he tried to get moving though worse he felt.    Past Medical History:  Diagnosis Date   HTN (hypertension)    OSA (obstructive sleep apnea)    PSG 09/23/10 AHI 22   RBBB (right bundle branch block)      Patient Active Problem List   Diagnosis Date Noted   Gastroesophageal reflux disease 03/14/2021   Elevated PSA 03/14/2021   Routine general medical examination at a health care facility 01/12/2014   Mood disorder ([place]) 07/02/2013   OSA (obstructive sleep apnea) 08/22/2010   Essential hypertension, benign 01/03/2010     Past Surgical History:  Procedure Laterality Date   APPENDECTOMY  1988   fracture L arm     TONSILLECTOMY     VASECTOMY  1997      Prior to Admission medications   Medication Sig Start Date End Date Taking? Authorizing Provider  bisoprolol-hydrochlorothiazide [facility]) 2.5-6.25 MG tablet TAKE 1 TABLET BY MOUTH ONCE DAILY 03/14/21   Viviana Simpler I, MD  DULoxetine (CYMBALTA) 30 MG capsule TAKE 1 CAPSULE BY MOUTH ONCE DAILY 03/29/21   Venia Carbon, MD  famotidine (PEPCID) 40 MG tablet Take 1 tablet (40 mg total) by mouth at bedtime. 03/14/21   Venia Carbon, MD  sildenafil (REVATIO) 20 MG  tablet TAKE 3-5 TABLETS BY MOUTH ONCE DAILY AS NEEDED 08/16/20   Venia Carbon, MD     Allergies Patient has no known allergies.   Family History  Problem Relation Age of Onset   Stroke Father 59       doing okay   Skin cancer Mother    Stroke Paternal Grandfather        and PGGF   Hypertension Other        dad's side    Diabetes Neg Hx        DM    Coronary artery disease Neg Hx    Prostate cancer Neg Hx        no colon cancer    Colon cancer Neg Hx     Social History Social History   Tobacco Use   Smoking status: Never   Smokeless tobacco: Former    Types: Chew   Tobacco comments:    rare cigarillo   Substance Use Topics   Alcohol use: Yes    Comment: once monthly on average   Drug use: Yes    Types: Marijuana    Review of Systems  Constitutional:   No fever or chills.  ENT:   No sore throat. No rhinorrhea. Cardiovascular: Positive chest pain without syncope. Respiratory: Positive shortness of breath without cough. Gastrointestinal:   Negative for abdominal pain, vomiting and diarrhea.  Musculoskeletal:   Negative for  focal pain or swelling All other systems reviewed and are negative except as documented above in ROS and HPI.  ____________________________________________   PHYSICAL EXAM:  VITAL SIGNS: ED Triage Vitals  Enc Vitals Group     BP 06/02/21 0750 (!) 215/141     Pulse Rate 06/02/21 0750 62     Resp 06/02/21 0750 (!) 24     Temp 06/02/21 0750 98.1 F (36.7 C)     Temp Source 06/02/21 0750 Oral     SpO2 06/02/21 0750 100 %     Weight 06/02/21 0755 220 lb (99.8 kg)     Height 06/02/21 0755 '6\' 1"'$  (1.854 m)     Head Circumference --      Peak Flow --      Pain Score 06/02/21 0747 10     Pain Loc --      Pain Edu? --      Excl. in [place]? --     Vital signs reviewed, nursing assessments reviewed.   Constitutional:   Alert and oriented. Non-toxic appearance. Eyes:   Conjunctivae are normal. EOMI. PERRL. ENT      Head:    Normocephalic and atraumatic.      Nose: Normal      Mouth/Throat: Normal      Neck:   No meningismus. Full ROM. Hematological/Lymphatic/Immunilogical:   No cervical lymphadenopathy. Cardiovascular:   RRR. Symmetric bilateral radial and DP pulses.  No murmurs. Cap refill less than 2 seconds. Respiratory:   Normal respiratory effort without tachypnea/retractions. Breath sounds are clear and equal bilaterally. No wheezes/rales/rhonchi. Gastrointestinal:   Soft and nontender. Non distended. There is no CVA tenderness.  No rebound, rigidity, or guarding. Genitourinary:   deferred Musculoskeletal:   Normal range of motion in all extremities. No joint effusions.  No lower extremity tenderness.  No edema. Neurologic:   Normal speech and language.  Motor grossly intact. No acute focal neurologic deficits are appreciated.  Skin:    Skin is warm, dry and intact. No rash noted.  No petechiae, purpura, or bullae.  ____________________________________________    LABS (pertinent positives/negatives) (all labs ordered are listed, but only abnormal results are displayed) Labs Reviewed  BASIC METABOLIC PANEL - Abnormal; Notable for the following components:      Result Value   CO2 20 (*)    Glucose, Bld 155 (*)    All other components within normal limits  CBC - Abnormal; Notable for the following components:   WBC 12.0 (*)    All other components within normal limits  HEPATIC FUNCTION PANEL - Abnormal; Notable for the following components:   Total Bilirubin 1.9 (*)    Indirect Bilirubin 1.7 (*)    All other components within normal limits  APTT - Abnormal; Notable for the following components:   aPTT 38 (*)    All other components within normal limits  TROPONIN I (HIGH SENSITIVITY) - Abnormal; Notable for the following components:   Troponin I (High Sensitivity) 595 (*)    All other components within normal limits  TROPONIN I (HIGH SENSITIVITY) - Abnormal; Notable for the following  components:   Troponin I (High Sensitivity) 558 (*)    All other components within normal limits  RESP PANEL BY RT-PCR (FLU A&B, COVID) ARPGX2  LIPASE, BLOOD  PROTIME-INR  HEPARIN LEVEL (UNFRACTIONATED)   ____________________________________________   EKG  EKG is interpreted by me Initial EKG at 7:50 AM shows sinus rhythm, rate of 69.  Normal axis, normal intervals.  Right bundle  branch block.  ST depression in V3 through V6, no ST elevation.  Multiple additional EKGs obtained at 9:21 AM 9:32 AM, 10:07 AM, and 10:32 AM showed persistent ST depression without significant evolving ST changes.  The last EKG at 10:32 AM shows some slight ST elevation in V6 only.  ____________________________________________    RADIOLOGY  DG Chest 2 View  Result Date: 06/02/2021 CLINICAL DATA:  cp EXAM: CHEST - 2 VIEW COMPARISON:  None. FINDINGS: The cardiomediastinal silhouette is within normal limits. No pleural effusion. No pneumothorax. No mass or consolidation. No acute osseous abnormality. IMPRESSION: No acute findings in the chest. Electronically Signed   By: Albin Felling M.D.   On: 06/02/2021 08:27   CT Angio Chest/Abd/Pel for Dissection W and/or Wo Contrast  Result Date: 06/02/2021 CLINICAL DATA:  Centralized chest pain radiating to arm. Vomiting. Difficulty speaking. EXAM: CT ANGIOGRAPHY CHEST, ABDOMEN AND PELVIS TECHNIQUE: Non-contrast CT of the chest was initially obtained. Multidetector CT imaging through the chest, abdomen and pelvis was performed using the standard protocol during bolus administration of intravenous contrast. Multiplanar reconstructed images and MIPs were obtained and reviewed to evaluate the vascular anatomy. CONTRAST:  135m OMNIPAQUE IOHEXOL 350 MG/ML SOLN COMPARISON:  None. FINDINGS: CTA CHEST FINDINGS Cardiovascular: No aortic intramural hematoma. No aortic dissection. Heart size within normal limits. Mediastinum/Nodes: No enlarged mediastinal, hilar, or axillary lymph  nodes. Thyroid gland, trachea, and esophagus demonstrate no significant findings. Lungs/Pleura: Evaluation of the lungs, particularly the lower lobes to motion. No focal airspace opacity pneumonia. No pneumothorax or pleural effusion. Musculoskeletal: Evaluation limited by motion. No acute abnormality. Review of the MIP images confirms the above findings. CTA ABDOMEN AND PELVIS FINDINGS VASCULAR Aorta: Normal caliber aorta without aneurysm, dissection, vasculitis or significant stenosis. Celiac: Motion limits evaluation celiac artery. No significant abnormality is suspected. SMA: Evaluation limited to motion.  Abnormality is suspected. Renals: 2 main right renal artery, 1 originating from the aorta and other origin from left common iliac artery. 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 No significant abnormality left main. IMA: Patent without evidence of aneurysm, dissection, vasculitis or significant stenosis. Inflow: Patent without evidence of aneurysm, dissection, vasculitis or significant stenosis. Veins: No obvious venous abnormality within the limitations of this arterial phase study. Review of the MIP images confirms the above findings. NON-VASCULAR Hepatobiliary: Evaluation limited due to motion. No acute abnormality. Pancreas: No acute abnormality. Spleen: Evaluation significantly limited due to motion. No acute abnormality. Adrenals/Urinary Tract: Adrenal glands are unremarkable. 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 Otherwise no significant abnormality of the kidneys. Bladder is unremarkable. Stomach/Bowel: Colonic diverticulosis without evidence of acute diverticulitis. The appendix is not identified. Lymphatic: No enlarged abdominal or pelvic lymph nodes. Reproductive: Enlarged nodular prostate impressing on the bladder neck. Other: No abdominal wall hernia or abnormality. No abdominopelvic ascites.  Musculoskeletal: No acute or significant osseous findings. Review of the MIP images confirms the above findings. IMPRESSION: 1. No acute abnormality of the chest, abdomen, or pelvis. No acute abnormality of the aorta. 2. Colonic diverticulosis. 3. Evaluation of the lower chest and upper abdomen significantly limited due to motion. Electronically Signed   By: FMiachel RouxM.D.   On: 06/02/2021 10:29    ____________________________________________   PROCEDURES .Critical Care Performed by: SCarrie Mew MD Authorized by: SCarrie Mew MD   Critical care provider statement:    Critical care time (minutes):  40   Critical care  time was exclusive of:  Separately billable procedures and treating other patients   Critical care was necessary to treat or prevent imminent or life-threatening deterioration of the following conditions:  Cardiac failure and circulatory failure   Critical care was time spent personally by me on the following activities:  Development of treatment plan with patient or surrogate, discussions with consultants, evaluation of patient's response to treatment, examination of patient, obtaining history from patient or surrogate, ordering and performing treatments and interventions, ordering and review of laboratory studies, ordering and review of radiographic studies, pulse oximetry, re-evaluation of patient's condition and review of old charts  ____________________________________________  DIFFERENTIAL DIAGNOSIS   ACS, dissection  CLINICAL IMPRESSION / ASSESSMENT AND PLAN / ED COURSE  Medications ordered in the ED: Medications  heparin bolus via infusion 4,000 Units (4,000 Units Intravenous Bolus from Bag 06/02/21 0942)    Followed by  heparin ADULT infusion 100 units/mL (25000 units/215m) (1,300 Units/hr Intravenous New Bag/Given 06/02/21 0939)  nitroGLYCERIN (NITROSTAT) SL tablet 0.4 mg (0.4 mg Sublingual Given 06/02/21 0931)  acetaminophen (TYLENOL) tablet 1,000 mg  (has no administration in time range)  nitroGLYCERIN 50 mg in dextrose 5 % 250 mL (0.2 mg/mL) infusion (has no administration in time range)  sodium chloride flush (NS) 0.9 % injection 3 mL (has no administration in time range)  aspirin chewable tablet 324 mg (324 mg Oral Given 06/02/21 0842)  hydrALAZINE (APRESOLINE) injection 10 mg (10 mg Intravenous Given 06/02/21 0842)  fentaNYL (SUBLIMAZE) injection 50 mcg (50 mcg Intravenous Given 06/02/21 0947)  sodium chloride 0.9 % bolus 1,000 mL (1,000 mLs Intravenous New Bag/Given 06/02/21 0944)  iohexol (OMNIPAQUE) 350 MG/ML injection 100 mL (100 mLs Intravenous Contrast Given 06/02/21 0956)    Pertinent labs & imaging results that were available during my care of the patient were reviewed by me and considered in my medical decision making (see chart for details).  Alex TINKEYwas evaluated in Emergency Department on 06/02/2021 for the symptoms described in the history of present illness. He was evaluated in the context of the global COVID-19 pandemic, which necessitated consideration that the patient might be at risk for infection with the SARS-CoV-2 virus that causes COVID-19. Institutional protocols and algorithms that pertain to the evaluation of patients at risk for COVID-19 are in a state of rapid change based on information released by regulatory bodies including the CDC and federal and state organizations. These policies and algorithms were followed during the patient's care in the ED.     Clinical Course as of 06/02/21 1123  Thu Jun 02, 2021  0817 Patient presents with multiple episodes of precordial pain concerning for non-STEMI.  Currently pain-free.  EKG abnormal with diffuse ST depressions and right bundle branch block, no prior to compare to.  Blood pressure severely elevated.  Will give IV hydralazine, follow-up labs.  Recommended patient undergo hospitalization for further cardiac work-up even if results are initially reassuring, with which she  agrees [PS]  [phone] now having recurrent chest pain.  Will give nitroglycerin.  Blood pressure still markedly elevated, may need to start nitroglycerin infusion [PS]  1022 Still having 10/10 pain.  Nitroglycerin is limited by low blood pressure at this point.  Will give fentanyl as needed for pain management.  CT angiogram obtained, images reviewed and interpreted by me which do not show any aortic dissection or pericardial fluid. [PS]  132Dr. CClayborn Bignessevaluated patient, will plan to take to the Cath Lab as soon as possible.  Cath  Lab currently occupied by another patient with a STEMI. [PS]    Clinical Course User Index [PS] Carrie Mew, MD     ____________________________________________   FINAL CLINICAL IMPRESSION(S) / ED DIAGNOSES    Final diagnoses:  Unstable angina [facility])  Uncontrolled hypertension     ED Discharge Orders     None       Portions of this note were generated with dragon dictation software. Dictation errors may occur despite best attempts at proofreading.    Carrie Mew, MD 06/02/21 1128

## 2021-06-02 NOTE — ED Notes (Signed)
Pt calling out due to excruciating centralized chest pain radiating into jaw, shoulder blades and head. Md stafford at bedside. Ekg repeated.

## 2021-06-02 NOTE — H&P (Signed)
NAME:  Alex [name], MRN:  [id number], DOB:  08-14-1961, LOS: 0 ADMISSION DATE:  06/02/2021, CONSULTATION DATE:  06/02/2021 REFERRING MD:  Dr. Clayborn Bigness, CHIEF COMPLAINT:  Chest pain   Brief Pt Description / Synopsis:  60 year old male admitted with acute STEMI requiring emergent cardiac catheterization with successful PCI and drug-eluting stent to the mid left Circumflex.  Marland Kitchen  History of Present Illness:  Alex [name] is a 60 year old male with a past medical history significant for hypertension, hyperlipidemia, and obstructive sleep apnea who presented to [facility] ED on 06/02/2021 due to complaints of central chest pain radiating to the left arm, also with associated shortness of breath, vomiting, diaphoresis.  He reported that the pain began around 3 AM this morning, was intermittent lasting about 30 minutes at a time.  His wife drove him to the hospital early this morning, but by the time he arrived the pain had subsided, so he chose to go home without undergoing evaluation.  The pain recurred later this morning while getting out of bed, of which he came back to ED.  ED COURSE: Initial vital signs: Temperature 98.1 F orally, respiratory rate 24, pulse 62, blood pressure 215/141, SPO2 100% on room air. Labs: Glucose 155, BUN 18, creatinine 1.16, high-sensitivity troponin 595, WBC 12.0 COVID-19 PCR negative Imaging: Chest x-ray:The cardiomediastinal silhouette is within normal limits. No pleural effusion. No pneumothorax. No mass or consolidation. No acute osseous abnormality. CTA chest/abdomen/pelvis: 1. No acute abnormality of the chest, abdomen, or pelvis. No acute abnormality of the aorta. 2. Colonic diverticulosis. 3. Evaluation of the lower chest and upper abdomen significantly limited due to motion. EKG: Diffuse ST elevation, no areas of reciprocal depression (highly suggestive of significant ischemia)  He was placed on IV heparin, along with IV nitroglycerin infusion due to  significantly elevated blood pressure.  Cardiology was consulted and he was taken emergently to the Cath Lab.  Cath revealed 100% occlusion to the circumflex, with successful PCI with drug-eluting stent was performed.  During the procedure he was placed on low-dose Levophed to assist with hypotension, along with Aggrastat infusion.  He is being transferred to ICU post procedure.  Following arrival to ICU, Levophed has been weaned off as he is now normotensive to hypertensive.  Pertinent  Medical History  Hypertension Hyperlipidemia Right bundle branch block Obstructive sleep apnea  Micro Data:  06/02/2021: SARS-CoV-2 and influenza PCR>> negative  Antimicrobials:  N/A  Significant Hospital Events: Including procedures, antibiotic start and stop dates in addition to other pertinent events   06/02/2021: Presented to ED with chest pain found to have STEMI, taken emergently to Cath Lab, PCI with drug-eluting stent to the circumflex performed.  Required low-dose Levophed during procedure.  Interim History / Subjective:  -Patient returns to ICU post cath -Levophed has been weaned off -Aggrastat infusing -Patient resting comfortably denies all complaints  Objective   Blood pressure (!) 143/96, pulse 69, temperature 98.1 F (36.7 C), temperature source Oral, resp. rate 20, height '6\' 1"'$  (1.854 m), weight 99.8 kg, SpO2 96 %.       No intake or output data in the 24 hours ending 06/02/21 1426 Filed Weights   06/02/21 0755  Weight: 99.8 kg    Examination: General: Acutely ill-appearing male, sitting in bed, in no acute distress HENT: Atraumatic, normocephalic, neck supple, no JVD Lungs: Clear breath sounds bilaterally, no wheezing or rales noted, even, nonlabored Cardiovascular: Bradycardia, regular rhythm, S1-S2, no murmurs, rubs, gallops, 2+ distal pulses Abdomen: Soft, nontender, nondistended, no  guarding rebound tenderness, bowel sounds positive x4 Extremities: Normal bulk and tone,  no deformities, no edema.  TR band in place and inflated to right radial artery, clean dry and intact with no signs of hematoma or bleeding Neuro: Sleeping, arouses easily to voice, alert and oriented x3, follows commands, no focal deficits, speech clear GU: Deferred Skin: Warm and dry.  No obvious rashes, lesions, ulcerations  Resolved Hospital Problem list     Assessment & Plan:   Acute STEMI, s/p Cardiac Cath with PCI & Drug Eluting Stent to Mid Circumflex on 06/02/21 PMHx of HTN, HLD, RBBB -Continuous cardiac monitoring -Maintain MAP >65 -Cautious IV fluids -Vasopressors as needed to maintain MAP goal ~ Levophed has been weaned off -HS Troponin peaked at 595 -Echocardiogram pending -Cardiology following, appreciate input -Aggrastat for 18 hrs -Dual Antiplatelet therapy with ASA + Brilinta as per Cardiology -Check Hgb A1c & Lipid panel -BB & ARB once BP stable  PMHx of Obstructive Sleep Apnea -Supplemental O2 as needed to maintain O2 sats greater than 92% -CPAP qhs -Follow intermittent chest x-ray and ABG as needed -Ensure pulmonary hygiene`   Best Practice (right click and "Reselect all SmartList Selections" daily)   Diet/type: Regular consistency (see orders) DVT prophylaxis: other GI prophylaxis: H2B Lines: N/A Foley:  N/A Code Status:  full code Last date of multidisciplinary goals of care discussion [N/A]  Pt's spouse updated at bedside 06/02/21.  All questions answered.  Labs   CBC: Recent Labs  Lab 06/02/21 0751  WBC 12.0*  HGB 16.3  HCT 48.2  MCV 85.2  PLT 99991111    Basic Metabolic Panel: Recent Labs  Lab 06/02/21 0751  NA 135  K 3.6  CL 102  CO2 20*  GLUCOSE 155*  BUN 18  CREATININE 1.16  CALCIUM 9.6   GFR: Estimated Creatinine Clearance: 86.3 mL/min (by C-G formula based on SCr of 1.16 mg/dL). Recent Labs  Lab 06/02/21 0751  WBC 12.0*    Liver Function Tests: Recent Labs  Lab 06/02/21 0751  AST 36  ALT 26  ALKPHOS 85  BILITOT  1.9*  PROT 7.3  ALBUMIN 4.4   Recent Labs  Lab 06/02/21 0751  LIPASE 30   No results for input(s): AMMONIA in the last 168 hours.  ABG No results found for: PHART, PCO2ART, PO2ART, HCO3, TCO2, ACIDBASEDEF, O2SAT   Coagulation Profile: Recent Labs  Lab 06/02/21 0931  INR 1.0    Cardiac Enzymes: No results for input(s): CKTOTAL, CKMB, CKMBINDEX, TROPONINI in the last 168 hours.  HbA1C: Hgb A1c MFr Bld  Date/Time Value Ref Range Status  01/12/2014 04:07 PM 5.5 4.6 - 6.5 % Final    Comment:    Glycemic Control Guidelines for People with Diabetes:Non Diabetic:  <6%Goal of Therapy: <7%Additional Action Suggested:  >8%     CBG: No results for input(s): GLUCAP in the last 168 hours.  Review of Systems:   Positives in BOLD: Pt denies all complaints Gen: Denies fever, chills, weight change, fatigue, night sweats HEENT: Denies blurred vision, double vision, hearing loss, tinnitus, sinus congestion, rhinorrhea, sore throat, neck stiffness, dysphagia PULM: Denies shortness of breath, cough, sputum production, hemoptysis, wheezing CV: Denies chest pain, edema, orthopnea, paroxysmal nocturnal dyspnea, palpitations GI: Denies abdominal pain, nausea, vomiting, diarrhea, hematochezia, melena, constipation, change in bowel habits GU: Denies dysuria, hematuria, polyuria, oliguria, urethral discharge Endocrine: Denies hot or cold intolerance, polyuria, polyphagia or appetite change Derm: Denies rash, dry skin, scaling or peeling skin change Heme: Denies easy bruising,  bleeding, bleeding gums Neuro: Denies headache, numbness, weakness, slurred speech, loss of memory or consciousness   Past Medical History:  He,  has a past medical history of HTN (hypertension), OSA (obstructive sleep apnea), and RBBB (right bundle branch block).   Surgical History:   Past Surgical History:  Procedure Laterality Date   APPENDECTOMY  1988   fracture L arm     TONSILLECTOMY     VASECTOMY  1997       Social History:   reports that he has never smoked. He has quit using smokeless tobacco.  His smokeless tobacco use included chew. He reports current alcohol use. He reports current drug use. Drug: Marijuana.   Family History:  His family history includes Hypertension in an other family member; Skin cancer in his mother; Stroke in his paternal grandfather; Stroke (age of onset: 60) in his father. There is no history of Diabetes, Coronary artery disease, Prostate cancer, or Colon cancer.   Allergies No Known Allergies   Home Medications  Prior to Admission medications   Medication Sig Start Date End Date Taking? Authorizing Provider  bisoprolol-hydrochlorothiazide [facility]) 2.5-6.25 MG tablet TAKE 1 TABLET BY MOUTH ONCE DAILY 03/14/21   Viviana Simpler I, MD  DULoxetine (CYMBALTA) 30 MG capsule TAKE 1 CAPSULE BY MOUTH ONCE DAILY 03/29/21   Venia Carbon, MD  famotidine (PEPCID) 40 MG tablet Take 1 tablet (40 mg total) by mouth at bedtime. 03/14/21   Venia Carbon, MD  sildenafil (REVATIO) 20 MG tablet TAKE 3-5 TABLETS BY MOUTH ONCE DAILY AS NEEDED 08/16/20   Venia Carbon, MD     Critical care time: 55 minutes     Darel Hong, AGACNP-BC [facility] Prefer epic messenger for cross cover needs If after hours, please call E-link

## 2021-06-02 NOTE — Consult Note (Signed)
ANTICOAGULATION CONSULT NOTE - Initial Consult  Pharmacy Consult for Heparin gtt Indication:  ACS/STEMI  No Known Allergies  Patient Measurements: Height: '6\' 1"'$  (185.4 cm) Weight: 99.8 kg (220 lb) IBW/kg (Calculated) : 79.9 Heparin Dosing Weight: 99.8kg  Vital Signs: Temp: 98.1 F (36.7 C) (09/08 0750) Temp Source: Oral (09/08 0750) BP: 192/133 (09/08 0842) Pulse Rate: 58 (09/08 0830)  Labs: Recent Labs    06/02/21 0751  HGB 16.3  HCT 48.2  PLT 267  CREATININE 1.16  TROPONINIHS 595*    Estimated Creatinine Clearance: 86.3 mL/min (by C-G formula based on SCr of 1.16 mg/dL).   Medical History: Past Medical History:  Diagnosis Date   HTN (hypertension)    OSA (obstructive sleep apnea)    PSG 09/23/10 AHI 22   RBBB (right bundle branch block)     Assessment: PMH relevant for OSA, HTN, and mood disorder. Patient presents to ER with chest pain and elevated troponin. Pharmacy was consulted for Heparin gtt initiation and management.   Baseline aptt and INR ordered - pending  Goal of Therapy:  Heparin level 0.3-0.7 units/ml Monitor platelets by anticoagulation protocol: Yes   Plan:  Give 4000 units bolus x 1 Start heparin infusion at 1300 units/hr Check anti-Xa level in 6 hours and daily while on heparin Continue to monitor H&H and platelets  Kandise Riehle Rodriguez-Guzman PharmD, BCPS 06/02/2021 9:09 AM

## 2021-06-02 NOTE — Progress Notes (Addendum)
1530 started removing air from TR band 3cc 1600 2cc removed from TR band bleeding noted replaced 2cc 1630  2cc removed from TR band bleeding noted replaced 2cc 1645  2cc removed from TR band bleeding noted replaced 2cc 1500 1cc air removed 1513 1 cc air removed 1540 1 cc air removed 1800 1 cc air removed 1815 1 cc air removed 1830 1 cc air removed  1850 1 cc air removed all air removed TR band still in place at this time will have night RN monitor before removing

## 2021-06-02 NOTE — ED Notes (Signed)
ED Provider at bedside. 

## 2021-06-02 NOTE — ED Triage Notes (Signed)
Pt to ED for centralized chest pain radiating to left arm.  Pt actively vomiting in triage, having difficulty speaking Started at 0330 this am Pt states pain went away earlier just came back

## 2021-06-02 NOTE — Progress Notes (Signed)
Brief post cath note Patient presented with persistent substernal chest pain abnormal EKG elevated troponin to 500 Patient presented still he was a STEMI but we had a STEMI patient on the table 1 to finish the STEMI patient and family him then we brought him to the cardiac Cath Lab patient had been on heparin he is given aspirin narcotic therapy for discomfort which was 10 out of 10 Patient came to emergency room last night but left without being seen and came back this morning when the pain returned EKG had diffuse ST elevation no areas or reciprocal depression but highly suggestive of significant ischemia Patient was brought to the cardiac Cath Lab Right radial approach LV function appeared to be normal greater than 55% Right coronary artery was large relatively free of disease Left main was large and relatively free of disease LAD was a large vessel with a proximal 75% lesion of mid 75% lesion and diffuse 50 throughout the mid to distal LAD Circumflex with extremely large vessel and 100% occluded in the midsegment IRA TIMI 0 flow We proceeded with intervention for circumflex Subsequent we were able to place 1 long 3 oh x34 mm DES stent up to 14 atm AV groove sidebranch was then dilated with a two 5 x 12 mm balloon leaving a 50% residual lesion AV groove circumflex had diffuse disease throughout and was a relatively large vessel Patient was placed on low-dose Levophed to help with blood pressure support he was placed on Brilinta aspirin Aggrastat for 18 hours Patient was then transferred to ICU for recovery Conclusion Late STEMI presentation Successful PCI and stent to mid circumflex with DES stent 100% occlusion TIMI 0 flow End of procedure 0% stenosis with TIMI-3 flow restored Case discussed with critical care team We will continue to follow troponins and echo was an EKG Patient hemodynamically stable at the end of the case upon transfer to ICU

## 2021-06-03 ENCOUNTER — Inpatient Hospital Stay
Admit: 2021-06-03 | Discharge: 2021-06-03 | Disposition: A | Discharge status: Home / Self Care | Payer: BC Managed Care – PPO | Attending: Internal Medicine | Admitting: Internal Medicine

## 2021-06-03 ENCOUNTER — Encounter: Payer: Self-pay | Admitting: Internal Medicine

## 2021-06-03 DIAGNOSIS — I10 Essential (primary) hypertension: Secondary | ICD-10-CM

## 2021-06-03 DIAGNOSIS — I213 ST elevation (STEMI) myocardial infarction of unspecified site: Secondary | ICD-10-CM

## 2021-06-03 LAB — BASIC METABOLIC PANEL
Anion gap: 9 (ref 5–15)
BUN: 15 mg/dL (ref 6–20)
CO2: 22 mmol/L (ref 22–32)
Calcium: 8.8 mg/dL — ABNORMAL LOW (ref 8.9–10.3)
Chloride: 103 mmol/L (ref 98–111)
Creatinine, Ser: 0.85 mg/dL (ref 0.61–1.24)
GFR, Estimated: >60 mL/min (ref >60)
Glucose, Bld: 132 mg/dL — ABNORMAL HIGH (ref 70–99)
Potassium: 3.3 mmol/L — ABNORMAL LOW (ref 3.5–5.1)
Sodium: 134 mmol/L — ABNORMAL LOW (ref 135–145)

## 2021-06-03 LAB — ECHOCARDIOGRAM COMPLETE
AR max vel: 4.28 cm2
AV Area VTI: 4.33 cm2
AV Area mean vel: 4.57 cm2
AV Mean grad: 5 mmHg
AV Peak grad: 8.6 mmHg
Ao pk vel: 1.47 m/s
Area-P 1/2: 4.01 cm2
Height: 73 in
MV VTI: 5.17 cm2
S' Lateral: 3.6 cm
Weight: 3622.6 oz

## 2021-06-03 LAB — CBC
HCT: 42.9 % (ref 39.0–52.0)
Hemoglobin: 15 g/dL (ref 13.0–17.0)
MCH: 30.4 pg (ref 26.0–34.0)
MCHC: 35 g/dL (ref 30.0–36.0)
MCV: 86.8 fL (ref 80.0–100.0)
Platelets: 245 10*3/uL (ref 150–400)
RBC: 4.94 MIL/uL (ref 4.22–5.81)
RDW: 12.8 % (ref 11.5–15.5)
WBC: 18.8 10*3/uL — ABNORMAL HIGH (ref 4.0–10.5)
nRBC: 0 % (ref 0.0–0.2)

## 2021-06-03 LAB — LIPID PANEL
Cholesterol: 181 mg/dL (ref 0–200)
HDL: 40 mg/dL — ABNORMAL LOW (ref >40)
LDL Cholesterol: 119 mg/dL — ABNORMAL HIGH (ref 0–99)
Total CHOL/HDL Ratio: 4.5 RATIO
Triglycerides: 108 mg/dL (ref <150)
VLDL: 22 mg/dL (ref 0–40)

## 2021-06-03 LAB — TROPONIN I (HIGH SENSITIVITY)
Troponin I (High Sensitivity): >24000 ng/L (ref <18)
Troponin I (High Sensitivity): >24000 ng/L (ref <18)

## 2021-06-03 LAB — MAGNESIUM: Magnesium: 2 mg/dL (ref 1.7–2.4)

## 2021-06-03 LAB — HEMOGLOBIN A1C
Hgb A1c MFr Bld: 5.5 % (ref 4.8–5.6)
Mean Plasma Glucose: 111.15 mg/dL

## 2021-06-03 LAB — PHOSPHORUS: Phosphorus: 3.4 mg/dL (ref 2.5–4.6)

## 2021-06-03 MED ORDER — METOPROLOL TARTRATE 5 MG/5ML IV SOLN: 2.5000 mg | INTRAVENOUS | Status: DC | PRN

## 2021-06-03 MED ORDER — POTASSIUM CHLORIDE CRYS ER 20 MEQ PO TBCR
40.0000 meq | EXTENDED_RELEASE_TABLET | Freq: Once | ORAL | Status: AC
  Administered 2021-06-03: 40 meq via ORAL
  Filled 2021-06-03: qty 2

## 2021-06-03 MED ORDER — POTASSIUM CHLORIDE 10 MEQ/100ML IV SOLN
10.0000 meq | INTRAVENOUS | Status: AC
  Administered 2021-06-03 (×2): 10 meq via INTRAVENOUS
  Filled 2021-06-03 (×2): qty 100

## 2021-06-03 MED ORDER — DULOXETINE HCL 30 MG PO CPEP
30.0000 mg | ORAL_CAPSULE | Freq: Every day | ORAL | Status: DC
  Administered 2021-06-03 – 2021-06-04 (×2): 30 mg via ORAL
  Filled 2021-06-03 (×2): qty 1

## 2021-06-03 MED ORDER — METOPROLOL TARTRATE 5 MG/5ML IV SOLN
5.0000 mg | Freq: Once | INTRAVENOUS | Status: AC
  Administered 2021-06-03: 5 mg via INTRAVENOUS
  Filled 2021-06-03: qty 5

## 2021-06-03 MED ORDER — CHLORHEXIDINE GLUCONATE CLOTH 2 % EX PADS
6.0000 | MEDICATED_PAD | Freq: Every day | CUTANEOUS | Status: DC
  Administered 2021-06-03: 6 via TOPICAL

## 2021-06-03 MED ORDER — HYDRALAZINE HCL 20 MG/ML IJ SOLN
10.0000 mg | INTRAMUSCULAR | Status: DC | PRN
  Administered 2021-06-03: 20 mg via INTRAVENOUS
  Administered 2021-06-03 – 2021-06-04 (×2): 10 mg via INTRAVENOUS
  Filled 2021-06-03 (×3): qty 1

## 2021-06-03 NOTE — Progress Notes (Signed)
Cpap declined by pt

## 2021-06-03 NOTE — Progress Notes (Signed)
*  PRELIMINARY RESULTS* Echocardiogram 2D Echocardiogram has been performed.  Alex [name] 06/03/2021, 12:38 PM

## 2021-06-03 NOTE — Progress Notes (Signed)
PHARMACY CONSULT NOTE - FOLLOW UP  Pharmacy Consult for Electrolyte Monitoring and Replacement   Recent Labs: Potassium (mmol/L)  Date Value  06/03/2021 3.3 (L)   Magnesium (mg/dL)  Date Value  06/03/2021 2.0   Calcium (mg/dL)  Date Value  06/03/2021 8.8 (L)   Albumin (g/dL)  Date Value  06/02/2021 4.4   Phosphorus (mg/dL)  Date Value  06/03/2021 3.4   Sodium (mmol/L)  Date Value  06/03/2021 134 (L)     Assessment: 60 year old male with STEMI s/p PCI to circumflex. Overnight 9/8-9/9 patient had runs of v.tach. PCCM consult for electrolyte management given recurrent arrhythmias.  Goal of Therapy:  K > 4, Mg > 2  Plan:  --Potassium 10 mEq IV x 2 given this morning --Add potassium 40 mEq PO x 1 --Recheck electrolytes with morning labs  Tawnya Crook, PharmD, BCPS Clinical Pharmacist 06/03/2021 11:30 AM

## 2021-06-03 NOTE — Progress Notes (Signed)
Patient transferred from ICU. Alert and oriented x 4. No complaints of pain. Does complain of some shortness of breath. 02 sats are 100% on room air. Patient seems a little anxious but states that he's just a very hyper person. He does not have any needs at this time. Will continue to monitor.

## 2021-06-03 NOTE — Progress Notes (Signed)
[facility] Cardiology    SUBJECTIVE: Patient states been reasonably well no chest pain no shortness of breath would like to start walking around and ambulating concerned about stage procedure for other lesions but feels much improved   Vitals:   06/03/21 0915 06/03/21 1000 06/03/21 1100 06/03/21 1200  BP: 125/89 121/78 138/87 131/82  Pulse: 80 78 72 70  Resp: 17 (!) 24 (!) 25 (!) 25  Temp:    98.4 F (36.9 C)  TempSrc:    Oral  SpO2: 95% 94% 94% 95%  Weight:      Height:         Intake/Output Summary (Last 24 hours) at 06/03/2021 1415 Last data filed at 06/03/2021 1100 Gross per 24 hour  Intake 1142.66 ml  Output 1650 ml  Net -507.34 ml      PHYSICAL EXAM  General: Well developed, well nourished, in no acute distress HEENT:  Normocephalic and atramatic Neck:  No JVD.  Lungs: Clear bilaterally to auscultation and percussion. Heart: HRRR . Normal S1 and S2 without gallops or murmurs.  Abdomen: Bowel sounds are positive, abdomen soft and non-tender  Msk:  Back normal, normal gait. Normal strength and tone for age. Extremities: No clubbing, cyanosis or edema.   Neuro: Alert and oriented X 3. Psych:  Good affect, responds appropriately   LABS: Basic Metabolic Panel: Recent Labs    06/02/21 0751 06/03/21 0102  NA 135 134*  K 3.6 3.3*  CL 102 103  CO2 20* 22  GLUCOSE 155* 132*  BUN 18 15  CREATININE 1.16 0.85  CALCIUM 9.6 8.8*  MG  --  2.0  PHOS  --  3.4   Liver Function Tests: Recent Labs    06/02/21 0751  AST 36  ALT 26  ALKPHOS 85  BILITOT 1.9*  PROT 7.3  ALBUMIN 4.4   Recent Labs    06/02/21 0751  LIPASE 30   CBC: Recent Labs    06/02/21 0751 06/03/21 0102  WBC 12.0* 18.8*  HGB 16.3 15.0  HCT 48.2 42.9  MCV 85.2 86.8  PLT 267 245   Cardiac Enzymes: No results for input(s): CKTOTAL, CKMB, CKMBINDEX, TROPONINI in the last 72 hours. BNP: Invalid input(s): POCBNP D-Dimer: No results for input(s): DDIMER in the last 72 hours. Hemoglobin  A1C: Recent Labs    06/03/21 0102  HGBA1C 5.5   Fasting Lipid Panel: Recent Labs    06/03/21 0102  CHOL 181  HDL 40*  LDLCALC 119*  TRIG 108  CHOLHDL 4.5   Thyroid Function Tests: No results for input(s): TSH, T4TOTAL, T3FREE, THYROIDAB in the last 72 hours.  Invalid input(s): FREET3 Anemia Panel: No results for input(s): VITAMINB12, FOLATE, FERRITIN, TIBC, IRON, RETICCTPCT in the last 72 hours.  DG Chest 2 View  Result Date: 06/02/2021 CLINICAL DATA:  cp EXAM: CHEST - 2 VIEW COMPARISON:  None. FINDINGS: The cardiomediastinal silhouette is within normal limits. No pleural effusion. No pneumothorax. No mass or consolidation. No acute osseous abnormality. IMPRESSION: No acute findings in the chest. Electronically Signed   By: Albin Felling M.D.   On: 06/02/2021 08:27   ECHOCARDIOGRAM COMPLETE  Result Date: 06/03/2021    ECHOCARDIOGRAM REPORT   Patient Name:   Alex [name] Date of Exam: 06/03/2021 Medical Rec #:  [id number]        Height:       73.0 in Accession #:    [id number]       Weight:       226.4  lb Date of Birth:  1960-11-13       BSA:          2.268 m Patient Age:    60 years         BP:           121/78 mmHg Patient Gender: M                HR:           73 bpm. Exam Location:  [facility] Procedure: 2D Echo, Color Doppler, Cardiac Doppler and Strain Analysis Indications:     I21.9 Acute myocardial infarction, unspecified  History:         Patient has no prior history of Echocardiogram examinations.                  Arrythmias:RBBB; Risk Factors:Hypertension and Sleep Apnea.  Sonographer:     Charmayne Sheer Referring Phys:  [id number] Brentley Landfair D Braxdon Gappa Diagnosing Phys: Yolonda Kida MD  Sonographer Comments: Global longitudinal strain was attempted. IMPRESSIONS  1. Normal LVF.  2. Left ventricular ejection fraction, by estimation, is 50 to 55%. The left ventricle has low normal function. The left ventricle has no regional wall motion abnormalities. Left ventricular diastolic parameters  were normal.  3. Right ventricular systolic function is normal. The right ventricular size is normal.  4. The mitral valve is normal in structure. Mild mitral valve regurgitation.  5. The aortic valve is grossly normal. Aortic valve regurgitation is not visualized. FINDINGS  Left Ventricle: Left ventricular ejection fraction, by estimation, is 50 to 55%. The left ventricle has low normal function. The left ventricle has no regional wall motion abnormalities. The left ventricular internal cavity size was normal in size. There is borderline left ventricular hypertrophy. Left ventricular diastolic parameters were normal. Right Ventricle: The right ventricular size is normal. No increase in right ventricular wall thickness. Right ventricular systolic function is normal. Left Atrium: Left atrial size was normal in size. Right Atrium: Right atrial size was normal in size. Pericardium: There is no evidence of pericardial effusion. Mitral Valve: The mitral valve is normal in structure. Mild mitral valve regurgitation. MV peak gradient, 3.3 mmHg. The mean mitral valve gradient is 1.0 mmHg. Tricuspid Valve: The tricuspid valve is normal in structure. Tricuspid valve regurgitation is mild. Aortic Valve: The aortic valve is grossly normal. Aortic valve regurgitation is not visualized. Aortic valve mean gradient measures 5.0 mmHg. Aortic valve peak gradient measures 8.6 mmHg. Aortic valve area, by VTI measures 4.33 cm. Pulmonic Valve: The pulmonic valve was grossly normal. Pulmonic valve regurgitation is not visualized. Aorta: The ascending aorta was not well visualized. IAS/Shunts: No atrial level shunt detected by color flow Doppler. Additional Comments: Normal LVF.  LEFT VENTRICLE PLAX 2D LVIDd:         4.70 cm  Diastology LVIDs:         3.60 cm  LV e' medial:    6.31 cm/s LV PW:         1.70 cm  LV E/e' medial:  13.6 LV IVS:        1.30 cm  LV e' lateral:   5.98 cm/s LVOT diam:     2.40 cm  LV E/e' lateral: 14.4 LV SV:          122 LV SV Index:   54 LVOT Area:     4.52 cm  RIGHT VENTRICLE RV Basal diam:  2.80 cm TAPSE (M-mode): 2.6 cm LEFT ATRIUM  Index       RIGHT ATRIUM           Index LA diam:        4.60 cm 2.03 cm/m  RA Area:     14.10 cm LA Vol (A2C):   70.9 ml 31.26 ml/m RA Volume:   34.80 ml  15.34 ml/m LA Vol (A4C):   49.3 ml 21.74 ml/m LA Biplane Vol: 64.4 ml 28.39 ml/m  AORTIC VALVE                    PULMONIC VALVE AV Area (Vmax):    4.28 cm     PV Vmax:       0.86 m/s AV Area (Vmean):   4.57 cm     PV Vmean:      57.300 cm/s AV Area (VTI):     4.33 cm     PV VTI:        0.140 m AV Vmax:           147.00 cm/s  PV Peak grad:  3.0 mmHg AV Vmean:          110.000 cm/s PV Mean grad:  2.0 mmHg AV VTI:            0.282 m AV Peak Grad:      8.6 mmHg AV Mean Grad:      5.0 mmHg LVOT Vmax:         139.00 cm/s LVOT Vmean:        111.000 cm/s LVOT VTI:          0.270 m LVOT/AV VTI ratio: 0.96  AORTA Ao Root diam: 3.80 cm MITRAL VALVE MV Area (PHT): 4.01 cm    SHUNTS MV Area VTI:   5.17 cm    Systemic VTI:  0.27 m MV Peak grad:  3.3 mmHg    Systemic Diam: 2.40 cm MV Mean grad:  1.0 mmHg MV Vmax:       0.90 m/s MV Vmean:      50.6 cm/s MV Decel Time: 189 msec MV E velocity: 86.10 cm/s MV A velocity: 46.30 cm/s MV E/A ratio:  1.86 Prudy Candy D Karess Harner MD Electronically signed by Yolonda Kida MD Signature Date/Time: 06/03/2021/2:02:21 PM    Final    CT Angio Chest/Abd/Pel for Dissection W and/or Wo Contrast  Result Date: 06/02/2021 CLINICAL DATA:  Centralized chest pain radiating to arm. Vomiting. Difficulty speaking. EXAM: CT ANGIOGRAPHY CHEST, ABDOMEN AND PELVIS TECHNIQUE: Non-contrast CT of the chest was initially obtained. Multidetector CT imaging through the chest, abdomen and pelvis was performed using the standard protocol during bolus administration of intravenous contrast. Multiplanar reconstructed images and MIPs were obtained and reviewed to evaluate the vascular anatomy. CONTRAST:  136m OMNIPAQUE  IOHEXOL 350 MG/ML SOLN COMPARISON:  None. FINDINGS: CTA CHEST FINDINGS Cardiovascular: No aortic intramural hematoma. No aortic dissection. Heart size within normal limits. Mediastinum/Nodes: No enlarged mediastinal, hilar, or axillary lymph nodes. Thyroid gland, trachea, and esophagus demonstrate no significant findings. Lungs/Pleura: Evaluation of the lungs, particularly the lower lobes to motion. No focal airspace opacity pneumonia. No pneumothorax or pleural effusion. Musculoskeletal: Evaluation limited by motion. No acute abnormality. Review of the MIP images confirms the above findings. CTA ABDOMEN AND PELVIS FINDINGS VASCULAR Aorta: Normal caliber aorta without aneurysm, dissection, vasculitis or significant stenosis. Celiac: Motion limits evaluation celiac artery. No significant abnormality is suspected. SMA: Evaluation limited to motion.  Abnormality is suspected. Renals: 2 main right renal artery, 1 originating  from the aorta and other origin from left common iliac artery. 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 No significant abnormality left main. IMA: Patent without evidence of aneurysm, dissection, vasculitis or significant stenosis. Inflow: Patent without evidence of aneurysm, dissection, vasculitis or significant stenosis. Veins: No obvious venous abnormality within the limitations of this arterial phase study. Review of the MIP images confirms the above findings. NON-VASCULAR Hepatobiliary: Evaluation limited due to motion. No acute abnormality. Pancreas: No acute abnormality. Spleen: Evaluation significantly limited due to motion. No acute abnormality. Adrenals/Urinary Tract: Adrenal glands are unremarkable. 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 Otherwise no significant abnormality of the kidneys. Bladder is unremarkable. Stomach/Bowel: Colonic diverticulosis without evidence of acute  diverticulitis. The appendix is not identified. Lymphatic: No enlarged abdominal or pelvic lymph nodes. Reproductive: Enlarged nodular prostate impressing on the bladder neck. Other: No abdominal wall hernia or abnormality. No abdominopelvic ascites. Musculoskeletal: No acute or significant osseous findings. Review of the MIP images confirms the above findings. IMPRESSION: 1. No acute abnormality of the chest, abdomen, or pelvis. No acute abnormality of the aorta. 2. Colonic diverticulosis. 3. Evaluation of the lower chest and upper abdomen significantly limited due to motion. Electronically Signed   By: Miachel Roux M.D.   On: 06/02/2021 10:29     Echo borderline overall left ventricular function 50 to 55%  TELEMETRY: Normal sinus rhythm rate of 70 nonspecific ST-T wave changes  ASSESSMENT AND PLAN:  Active Problems:   STEMI (ST elevation myocardial infarction) ([place]) Hypertension Hyperlipidemia Borderline obesity Coronary artery disease Multiple PVCs GERD  Plan Status post PCI and stent after STEMI presentation to mid circumflex Severely elevated troponin suggestive of significant myocardial infarction Episodes of multiple PVCs placed on increased dose of beta-blocker to help with symptom management Have the patient continue on aspirin Brilinta for at least 12 months Consider staged procedure for LAD lesion and possibly OM Transferred patient to telemetry Anticipate discharge hopefully tomorrow normal sinus rhythm rate of 70     Lennyx Verdell D Clayborn Bigness, MD 06/03/2021 2:15 PM

## 2021-06-03 NOTE — Progress Notes (Addendum)
    BRIEF OVERNIGHT PROGRESS REPORT  A 60 year old man with PMH of HLD, OSA and hypertension admitted with ST elevation myocardial infarction (STEMI) and underwent percutaneous coronary intervention (PCI) and stent to mid circumflex with DES stent 100% occlusion TIMI 0 flow. After uncomplicated procedure, he was transferred to the ICU for close monitorin. About 12 hours after the procedure, the patient's was noted a five-beat run of ventricular tachycardia (VT) that was increasing in frequency and duration. The patient is asymptomatic and other vital signs were stable.  On bedside assessment, he was afebrile with blood pressure 144/108 mm Hg and pulse rate 68 beats/min. he was alert and oriented x4, and he did not complain of any pain other than previous nausea/vomiting and indigestion which has since resolved.  Telemetry review:  1906: 9 beat run of vtach,  1916: 6 beat VT,  1940: 4 beatVT 2027: 8 beat VT 2122: 6 beat VT 2124: 7 beat VT 2214: 8 beat run wide qrs complex 2234: 5 beat VT 0000: 5 beat VT 0023: 6 beat VT 0035: 16 beat  VT  ASSESSMENT & PLAN Nonsustained Ventricular Tachycardia After Acute Coronary Syndromes  Suspect reperfusion arrhythmias or enhanced automaticity in the infarct border or increased sympathetic activity However given increasing frequenct and duration with eval for ischemia, electrolyte imbalances,  -Obtain EKG -Check STAT BMP+Mg+Phos, troponin -Apply Defib pads -Discussed with oncall Cardiologist Dr. Clayborn Bigness who recommended IV or po metoprolol as BP and HR permits every hour.     Rufina Falco, DNP, CCRN, FNP-C, AGACNP-BC Acute Care Nurse Practitioner  [facility] Pager: [phone] [place] [facility]

## 2021-06-03 NOTE — Progress Notes (Signed)
Patient ID: TAEVIN HLAVAC, male   DOB: 1960-09-30, 60 y.o.   MRN: [id number]  PROGRESS NOTE    ABDISHAKUR RYMAN  [id number] DOB: Feb 27, 1961 DOA: 06/02/2021 PCP: Venia Carbon, MD   Brief Narrative:  60 year old male with history of hypertension, hyperlipidemia, obstructive sleep apnea presented on 06/02/2021 with chest pain.  He was found to have STEMI for which he underwent cardiac catheterization with PCI and DES to mid left circumflex.  Postprocedure, he was hypotensive and required low-dose Levophed and transferred to ICU.  He was subsequently transferred to [facility] service on 06/03/2021.  Assessment & Plan:   STEMI -Status post cardiac cath with PCI and DES to mid circumflex on 06/02/2021 -Cardiology following.  Continue aspirin, statin, metoprolol and Brilinta.  Follow 2D echo. -Patient was hypotensive postprocedure requiring low-dose Levophed which has already been discontinued.  Hypertension -Blood pressure currently stable.  Continue metoprolol and losartan.  Hyperlipidemia Continue statin  Leukocytosis -Possibly reactive.  Monitor  Hypokalemia -Replace.  Repeat a.m. labs.  OSA -Continue CPAP nightly    DVT prophylaxis: Off heparin drip.  Start Lovenox Code Status: Full Family Communication: Wife at bedside Disposition Plan: Status is: Inpatient  Remains inpatient appropriate because:Inpatient level of care appropriate due to severity of illness  Dispo: The patient is from: Home              Anticipated d/c is to: Home              Patient currently is not medically stable to d/c.   Difficult to place patient No   Consultants: Cardiology/PCCM  Procedures: Cardiac catheterization on 06/02/2021  Antimicrobials: None   Subjective: Patient seen and examined at bedside.  Complains of some shortness of breath.  No current chest pain.  No fever or vomiting reported.  Complains of intermittent nausea.  Objective: Vitals:   06/03/21 0915 06/03/21 1000 06/03/21  1100 06/03/21 1200  BP: 125/89 121/78 138/87 131/82  Pulse: 80 78 72 70  Resp: 17 (!) 24 (!) 25 (!) 25  Temp:    98.4 F (36.9 C)  TempSrc:    Oral  SpO2: 95% 94% 94% 95%  Weight:      Height:        Intake/Output Summary (Last 24 hours) at 06/03/2021 1249 Last data filed at 06/03/2021 0800 Gross per 24 hour  Intake 902.66 ml  Output 1650 ml  Net -747.34 ml   Filed Weights   06/02/21 0755 06/02/21 1432  Weight: 99.8 kg 102.7 kg    Examination:  General exam: Appears calm and comfortable.  Currently on room air. Respiratory system: Bilateral decreased breath sounds at bases with some scattered crackles and intermittent tachypnea Cardiovascular system: S1 & S2 heard, Rate controlled Gastrointestinal system: Abdomen is nondistended, soft and nontender. Normal bowel sounds heard. Extremities: No cyanosis, clubbing, edema  Central nervous system: Alert and oriented. No focal neurological deficits. Moving extremities Skin: No rashes, lesions or ulcers Psychiatry: Judgement and insight appear normal. Mood & affect appropriate.     Data Reviewed: I have personally reviewed following labs and imaging studies  CBC: Recent Labs  Lab 06/02/21 0751 06/03/21 0102  WBC 12.0* 18.8*  HGB 16.3 15.0  HCT 48.2 42.9  MCV 85.2 86.8  PLT 267 [id number]   Basic Metabolic Panel: Recent Labs  Lab 06/02/21 0751 06/03/21 0102  NA 135 134*  K 3.6 3.3*  CL 102 103  CO2 20* 22  GLUCOSE 155* 132*  BUN 18 15  CREATININE 1.16 0.85  CALCIUM 9.6 8.8*  MG  --  2.0  PHOS  --  3.4   GFR: Estimated Creatinine Clearance: 119.2 mL/min (by C-G formula based on SCr of 0.85 mg/dL). Liver Function Tests: Recent Labs  Lab 06/02/21 0751  AST 36  ALT 26  ALKPHOS 85  BILITOT 1.9*  PROT 7.3  ALBUMIN 4.4   Recent Labs  Lab 06/02/21 0751  LIPASE 30   No results for input(s): AMMONIA in the last 168 hours. Coagulation Profile: Recent Labs  Lab 06/02/21 0931  INR 1.0   Cardiac Enzymes: No  results for input(s): CKTOTAL, CKMB, CKMBINDEX, TROPONINI in the last 168 hours. BNP (last 3 results) No results for input(s): PROBNP in the last 8760 hours. HbA1C: Recent Labs    06/03/21 0102  HGBA1C 5.5   CBG: Recent Labs  Lab 06/02/21 1440  GLUCAP 147*   Lipid Profile: Recent Labs    06/03/21 0102  CHOL 181  HDL 40*  LDLCALC 119*  TRIG 108  CHOLHDL 4.5   Thyroid Function Tests: No results for input(s): TSH, T4TOTAL, FREET4, T3FREE, THYROIDAB in the last 72 hours. Anemia Panel: No results for input(s): VITAMINB12, FOLATE, FERRITIN, TIBC, IRON, RETICCTPCT in the last 72 hours. Sepsis Labs: No results for input(s): PROCALCITON, LATICACIDVEN in the last 168 hours.  Recent Results (from the past 240 hour(s))  Resp Panel by RT-PCR (Flu A&B, Covid) Nasopharyngeal Swab     Status: None   Collection Time: 06/02/21  8:14 AM   Specimen: Nasopharyngeal Swab; Nasopharyngeal(NP) swabs in vial transport medium  Result Value Ref Range Status   SARS Coronavirus 2 by RT PCR NEGATIVE NEGATIVE Final    Comment: (NOTE) SARS-CoV-2 target nucleic acids are NOT DETECTED.  The SARS-CoV-2 RNA is generally detectable in upper respiratory specimens during the acute phase of infection. The lowest concentration of SARS-CoV-2 viral copies this assay can detect is 138 copies/mL. A negative result does not preclude SARS-Cov-2 infection and should not be used as the sole basis for treatment or other patient management decisions. A negative result may occur with  improper specimen collection/handling, submission of specimen other than nasopharyngeal swab, presence of viral mutation(s) within the areas targeted by this assay, and inadequate number of viral copies(<138 copies/mL). A negative result must be combined with clinical observations, patient history, and epidemiological information. The expected result is Negative.  Fact Sheet for Patients:   EntrepreneurPulse.com.au  Fact Sheet for Healthcare Providers:  IncredibleEmployment.be  This test is no t yet approved or cleared by the [place] FDA and  has been authorized for detection and/or diagnosis of SARS-CoV-2 by FDA under an Emergency Use Authorization (EUA). This EUA will remain  in effect (meaning this test can be used) for the duration of the COVID-19 declaration under Section 564(b)(1) of the Act, 21 U.S.C.section 360bbb-3(b)(1), unless the authorization is terminated  or revoked sooner.       Influenza A by PCR NEGATIVE NEGATIVE Final   Influenza B by PCR NEGATIVE NEGATIVE Final    Comment: (NOTE) The Xpert Xpress SARS-CoV-2/FLU/RSV plus assay is intended as an aid in the diagnosis of influenza from Nasopharyngeal swab specimens and should not be used as a sole basis for treatment. Nasal washings and aspirates are unacceptable for Xpert Xpress SARS-CoV-2/FLU/RSV testing.  Fact Sheet for Patients: EntrepreneurPulse.com.au  Fact Sheet for Healthcare Providers: IncredibleEmployment.be  This test is not yet approved or cleared by the [place] FDA and has been authorized for detection and/or diagnosis  of SARS-CoV-2 by FDA under an Emergency Use Authorization (EUA). This EUA will remain in effect (meaning this test can be used) for the duration of the COVID-19 declaration under Section 564(b)(1) of the Act, 21 U.S.C. section 360bbb-3(b)(1), unless the authorization is terminated or revoked.  Performed at [facility], [address] [place], [place] [address]   MRSA Next Gen by PCR, Nasal     Status: None   Collection Time: 06/02/21  3:29 PM   Specimen: Nasal Mucosa; Nasal Swab  Result Value Ref Range Status   MRSA by PCR Next Gen NOT DETECTED NOT DETECTED Final    Comment: (NOTE) The GeneXpert MRSA Assay (FDA approved for NASAL specimens only), is one component of a  comprehensive MRSA colonization surveillance program. It is not intended to diagnose MRSA infection nor to guide or monitor treatment for MRSA infections. Test performance is not FDA approved in patients less than 8 years old. Performed at [facility], [address] [place], [place] [address]          Radiology Studies: DG Chest 2 View  Result Date: 06/02/2021 CLINICAL DATA:  cp EXAM: CHEST - 2 VIEW COMPARISON:  None. FINDINGS: The cardiomediastinal silhouette is within normal limits. No pleural effusion. No pneumothorax. No mass or consolidation. No acute osseous abnormality. IMPRESSION: No acute findings in the chest. Electronically Signed   By: Albin Felling M.D.   On: 06/02/2021 08:27   CT Angio Chest/Abd/Pel for Dissection W and/or Wo Contrast  Result Date: 06/02/2021 CLINICAL DATA:  Centralized chest pain radiating to arm. Vomiting. Difficulty speaking. EXAM: CT ANGIOGRAPHY CHEST, ABDOMEN AND PELVIS TECHNIQUE: Non-contrast CT of the chest was initially obtained. Multidetector CT imaging through the chest, abdomen and pelvis was performed using the standard protocol during bolus administration of intravenous contrast. Multiplanar reconstructed images and MIPs were obtained and reviewed to evaluate the vascular anatomy. CONTRAST:  146m OMNIPAQUE IOHEXOL 350 MG/ML SOLN COMPARISON:  None. FINDINGS: CTA CHEST FINDINGS Cardiovascular: No aortic intramural hematoma. No aortic dissection. Heart size within normal limits. Mediastinum/Nodes: No enlarged mediastinal, hilar, or axillary lymph nodes. Thyroid gland, trachea, and esophagus demonstrate no significant findings. Lungs/Pleura: Evaluation of the lungs, particularly the lower lobes to motion. No focal airspace opacity pneumonia. No pneumothorax or pleural effusion. Musculoskeletal: Evaluation limited by motion. No acute abnormality. Review of the MIP images confirms the above findings. CTA ABDOMEN AND PELVIS FINDINGS VASCULAR  Aorta: Normal caliber aorta without aneurysm, dissection, vasculitis or significant stenosis. Celiac: Motion limits evaluation celiac artery. No significant abnormality is suspected. SMA: Evaluation limited to motion.  Abnormality is suspected. Renals: 2 main right renal artery, 1 originating from the aorta and other origin from left common iliac artery. 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 No significant abnormality left main. IMA: Patent without evidence of aneurysm, dissection, vasculitis or significant stenosis. Inflow: Patent without evidence of aneurysm, dissection, vasculitis or significant stenosis. Veins: No obvious venous abnormality within the limitations of this arterial phase study. Review of the MIP images confirms the above findings. NON-VASCULAR Hepatobiliary: Evaluation limited due to motion. No acute abnormality. Pancreas: No acute abnormality. Spleen: Evaluation significantly limited due to motion. No acute abnormality. Adrenals/Urinary Tract: Adrenal glands are unremarkable. 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 Otherwise no significant abnormality of the kidneys. Bladder is unremarkable. Stomach/Bowel: Colonic diverticulosis without evidence of acute diverticulitis. The appendix is not identified. Lymphatic: No enlarged  abdominal or pelvic lymph nodes. Reproductive: Enlarged nodular prostate impressing on the bladder neck. Other: No abdominal wall hernia or abnormality. No abdominopelvic ascites. Musculoskeletal: No acute or significant osseous findings. Review of the MIP images confirms the above findings. IMPRESSION: 1. No acute abnormality of the chest, abdomen, or pelvis. No acute abnormality of the aorta. 2. Colonic diverticulosis. 3. Evaluation of the lower chest and upper abdomen significantly limited due to motion. Electronically Signed   By: Miachel Roux M.D.   On: 06/02/2021 10:29         Scheduled Meds:  aspirin  81 mg Oral Daily   atorvastatin  80 mg Oral Daily   Chlorhexidine Gluconate Cloth  6 each Topical Daily   famotidine  40 mg Oral QHS   influenza vac split quadrivalent PF  0.5 mL Intramuscular Tomorrow-1000   losartan  25 mg Oral Daily   metoprolol tartrate  25 mg Oral BID   sodium chloride flush  3 mL Intravenous Q12H   sodium chloride flush  3 mL Intravenous Q12H   ticagrelor  90 mg Oral BID   Continuous Infusions:  sodium chloride     nitroGLYCERIN Stopped (06/02/21 1319)          Aline August, MD [facility] 06/03/2021, 12:49 PM

## 2021-06-04 LAB — CBC WITH DIFFERENTIAL/PLATELET
Abs Immature Granulocytes: 0.09 10*3/uL — ABNORMAL HIGH (ref 0.00–0.07)
Basophils Absolute: 0 10*3/uL (ref 0.0–0.1)
Basophils Relative: 0 %
Eosinophils Absolute: 0 10*3/uL (ref 0.0–0.5)
Eosinophils Relative: 0 %
HCT: 41.2 % (ref 39.0–52.0)
Hemoglobin: 14 g/dL (ref 13.0–17.0)
Immature Granulocytes: 1 %
Lymphocytes Relative: 10 %
Lymphs Abs: 1.5 10*3/uL (ref 0.7–4.0)
MCH: 29.9 pg (ref 26.0–34.0)
MCHC: 34 g/dL (ref 30.0–36.0)
MCV: 87.8 fL (ref 80.0–100.0)
Monocytes Absolute: 1.1 10*3/uL — ABNORMAL HIGH (ref 0.1–1.0)
Monocytes Relative: 8 %
Neutro Abs: 11.8 10*3/uL — ABNORMAL HIGH (ref 1.7–7.7)
Neutrophils Relative %: 81 %
Platelets: 186 10*3/uL (ref 150–400)
RBC: 4.69 MIL/uL (ref 4.22–5.81)
RDW: 13 % (ref 11.5–15.5)
WBC: 14.5 10*3/uL — ABNORMAL HIGH (ref 4.0–10.5)
nRBC: 0 % (ref 0.0–0.2)

## 2021-06-04 LAB — BASIC METABOLIC PANEL
Anion gap: 4 — ABNORMAL LOW (ref 5–15)
BUN: 15 mg/dL (ref 6–20)
CO2: 28 mmol/L (ref 22–32)
Calcium: 8.7 mg/dL — ABNORMAL LOW (ref 8.9–10.3)
Chloride: 103 mmol/L (ref 98–111)
Creatinine, Ser: 0.98 mg/dL (ref 0.61–1.24)
GFR, Estimated: >60 mL/min (ref >60)
Glucose, Bld: 119 mg/dL — ABNORMAL HIGH (ref 70–99)
Potassium: 3.7 mmol/L (ref 3.5–5.1)
Sodium: 135 mmol/L (ref 135–145)

## 2021-06-04 LAB — MAGNESIUM: Magnesium: 2.1 mg/dL (ref 1.7–2.4)

## 2021-06-04 MED ORDER — NITROGLYCERIN 0.4 MG SL SUBL: 0.4000 mg | SUBLINGUAL_TABLET | SUBLINGUAL | 0 refills | Status: DC | PRN

## 2021-06-04 MED ORDER — LOSARTAN POTASSIUM 50 MG PO TABS: 50.0000 mg | ORAL_TABLET | Freq: Every day | ORAL | 0 refills | Status: AC

## 2021-06-04 MED ORDER — METOPROLOL TARTRATE 50 MG PO TABS: 50.0000 mg | ORAL_TABLET | Freq: Two times a day (BID) | ORAL | Status: DC

## 2021-06-04 MED ORDER — ATORVASTATIN CALCIUM 80 MG PO TABS: 80.0000 mg | ORAL_TABLET | Freq: Every day | ORAL | 0 refills | Status: DC

## 2021-06-04 MED ORDER — LOSARTAN POTASSIUM 50 MG PO TABS: 50.0000 mg | ORAL_TABLET | Freq: Every day | ORAL | Status: DC

## 2021-06-04 MED ORDER — TICAGRELOR 90 MG PO TABS: 90.0000 mg | ORAL_TABLET | Freq: Two times a day (BID) | ORAL | 0 refills | Status: DC

## 2021-06-04 MED ORDER — ASPIRIN 81 MG PO CHEW: 81.0000 mg | CHEWABLE_TABLET | Freq: Every day | ORAL | 0 refills | Status: AC

## 2021-06-04 MED ORDER — ATORVASTATIN CALCIUM 80 MG PO TABS: 80.0000 mg | ORAL_TABLET | Freq: Every day | ORAL | 0 refills | Status: AC

## 2021-06-04 MED ORDER — METOPROLOL TARTRATE 50 MG PO TABS: 50.0000 mg | ORAL_TABLET | Freq: Two times a day (BID) | ORAL | 0 refills | Status: DC

## 2021-06-04 MED ORDER — LOSARTAN POTASSIUM 50 MG PO TABS: 50.0000 mg | ORAL_TABLET | Freq: Every day | ORAL | 0 refills | Status: DC

## 2021-06-04 NOTE — Progress Notes (Signed)
Alex [name] to be D/C'd Home per MD order.  Discussed prescriptions and follow up appointments with the patient and wife Prescription electronically submitted ,medication list explained in detail. Radial site care education gone over with patient and wife. Brilinta coupon given to patient. Stent card also given to patient. Pt verbalized understanding.  Allergies as of 06/04/2021   No Known Allergies      Medication List     STOP taking these medications    bisoprolol-hydrochlorothiazide 2.5-6.25 MG tablet Commonly known as: ZIAC   sildenafil 20 MG tablet Commonly known as: REVATIO       TAKE these medications    aspirin 81 MG chewable tablet Chew 1 tablet (81 mg total) by mouth daily. Start taking on: June 05, 2021   atorvastatin 80 MG tablet Commonly known as: LIPITOR Take 1 tablet (80 mg total) by mouth daily. Start taking on: June 05, 2021   DULoxetine 30 MG capsule Commonly known as: CYMBALTA TAKE 1 CAPSULE BY MOUTH ONCE DAILY   famotidine 40 MG tablet Commonly known as: PEPCID Take 1 tablet (40 mg total) by mouth at bedtime.   losartan 50 MG tablet Commonly known as: COZAAR Take 1 tablet (50 mg total) by mouth daily. Start taking on: June 05, 2021   metoprolol tartrate 50 MG tablet Commonly known as: LOPRESSOR Take 1 tablet (50 mg total) by mouth 2 (two) times daily.   nitroGLYCERIN 0.4 MG SL tablet Commonly known as: NITROSTAT Place 1 tablet (0.4 mg total) under the tongue every 5 (five) minutes as needed for chest pain.   ticagrelor 90 MG Tabs tablet Commonly known as: BRILINTA Take 1 tablet (90 mg total) by mouth 2 (two) times daily.        Vitals:   06/04/21 0450 06/04/21 0800  BP: (!) 148/108 (!) 151/102  Pulse: 79 79  Resp: 18 15  Temp: 98 F (36.7 C) 98.2 F (36.8 C)  SpO2: 96% 96%    Skin clean, dry and intact without evidence of skin break down, no evidence of skin tears noted. IV catheter discontinued intact.  Site without signs and symptoms of complications. Dressing and pressure applied. Pt denies pain at this time. No complaints noted.  An After Visit Summary was printed and given to the patient. Patient escorted via [place], and D/C home via private auto.  [place]

## 2021-06-04 NOTE — Progress Notes (Signed)
[facility]    SUBJECTIVE: Patient feels reasonably well presented with STEMI 48 hours ago underwent PCI and stent to circumflex bifurcation lesion good result patient also has residual disease in proximal to mid LAD that will hopefully be staged at a later date patient has no chest pain or shortness of breath feels reasonably well feels well enough to be discharged home   Vitals:   06/04/21 0156 06/04/21 0450 06/04/21 0531 06/04/21 0800  BP: (!) 152/105 (!) 148/108  (!) 151/102  Pulse: 80 79  79  Resp: '15 18  15  '$ Temp: 98.4 F (36.9 C) 98 F (36.7 C)  98.2 F (36.8 C)  TempSrc: Oral Oral  Oral  SpO2: 96% 96%  96%  Weight:   101 kg   Height:         Intake/Output Summary (Last 24 hours) at 06/04/2021 0901 Last data filed at 06/04/2021 0500 Gross per 24 hour  Intake 1300 ml  Output 825 ml  Net 475 ml      PHYSICAL EXAM  General: Well developed, well nourished, in no acute distress HEENT:  Normocephalic and atramatic Neck:  No JVD.  Lungs: Clear bilaterally to auscultation and percussion. Heart: HRRR . Normal S1 and S2 without gallops or murmurs.  Abdomen: Bowel sounds are positive, abdomen soft and non-tender  Msk:  Back normal, normal gait. Normal strength and tone for age. Extremities: No clubbing, cyanosis or edema.   Neuro: Alert and oriented X 3. Psych:  Good affect, responds appropriately   LABS: Basic Metabolic Panel: Recent Labs    06/03/21 0102 06/04/21 0508  NA 134* 135  K 3.3* 3.7  CL 103 103  CO2 22 28  GLUCOSE 132* 119*  BUN 15 15  CREATININE 0.85 0.98  CALCIUM 8.8* 8.7*  MG 2.0 2.1  PHOS 3.4  --    Liver Function Tests: Recent Labs    06/02/21 0751  AST 36  ALT 26  ALKPHOS 85  BILITOT 1.9*  PROT 7.3  ALBUMIN 4.4   Recent Labs    06/02/21 0751  LIPASE 30   CBC: Recent Labs    06/03/21 0102 06/04/21 0508  WBC 18.8* 14.5*  NEUTROABS  --  11.8*  HGB 15.0 14.0  HCT 42.9 41.2  MCV 86.8 87.8  PLT 245 186   Cardiac  Enzymes: No results for input(s): CKTOTAL, CKMB, CKMBINDEX, TROPONINI in the last 72 hours. BNP: Invalid input(s): POCBNP D-Dimer: No results for input(s): DDIMER in the last 72 hours. Hemoglobin A1C: Recent Labs    06/03/21 0102  HGBA1C 5.5   Fasting Lipid Panel: Recent Labs    06/03/21 0102  CHOL 181  HDL 40*  LDLCALC 119*  TRIG 108  CHOLHDL 4.5   Thyroid Function Tests: No results for input(s): TSH, T4TOTAL, T3FREE, THYROIDAB in the last 72 hours.  Invalid input(s): FREET3 Anemia Panel: No results for input(s): VITAMINB12, FOLATE, FERRITIN, TIBC, IRON, RETICCTPCT in the last 72 hours.  CARDIAC CATHETERIZATION  Result Date: 06/03/2021   Prox Cx to Mid Cx lesion is 100% stenosed.   Prox LAD lesion is 75% stenosed.   Mid LAD lesion is 80% stenosed.   Dist LAD lesion is 25% stenosed.   Dist Cx-1 lesion is 75% stenosed.   Dist Cx-2 lesion is 90% stenosed.   A drug-eluting stent was successfully placed using a STENT ONYX FRONTIER 3.0X34.   Balloon angioplasty was performed using a BALLOON TREK RX 2.5X12.   Post intervention, there is a 0%  residual stenosis.   Post intervention, there is a 50% residual stenosis.   There is mild left ventricular systolic dysfunction.   LV end diastolic pressure is mildly elevated.   The left ventricular ejection fraction is 50-55% by visual estimate. Conclusion Successful PCI and stent to mid circumflex STEMI presentation with restoration of TIMI-3 flow from TIMI 0 lesion from 100% down to 0% stenosis LAD with 75% proximal lesion 80% mid which we will defer for now and staged at a later date RCA large and free of disease LV function was reasonably preserved between 50 and 55% with mild apical hypo- Patient to be maintained on aspirin Brilinta for at least 12 months Aggrastat for 18 hours Anticipate discharge after 3 to 4 days Transferred to ICU for critical care management   ECHOCARDIOGRAM COMPLETE  Result Date: 06/03/2021    ECHOCARDIOGRAM REPORT    Patient Name:   Alex [name] Date of Exam: 06/03/2021 Medical Rec #:  [id number]        Height:       73.0 in Accession #:    [id number]       Weight:       226.4 lb Date of Birth:  02-Feb-1961       BSA:          2.268 m Patient Age:    60 years         BP:           121/78 mmHg Patient Gender: M                HR:           73 bpm. Exam Location:  [facility] Procedure: 2D Echo, Color Doppler, Cardiac Doppler and Strain Analysis Indications:     I21.9 Acute myocardial infarction, unspecified  History:         Patient has no prior history of Echocardiogram examinations.                  Arrythmias:RBBB; Risk Factors:Hypertension and Sleep Apnea.  Sonographer:     Charmayne Sheer Referring Phys:  [id number] Manoah Deckard D Michale Emmerich Diagnosing Phys: Yolonda Kida MD  Sonographer Comments: Global longitudinal strain was attempted. IMPRESSIONS  1. Normal LVF.  2. Left ventricular ejection fraction, by estimation, is 50 to 55%. The left ventricle has low normal function. The left ventricle has no regional wall motion abnormalities. Left ventricular diastolic parameters were normal.  3. Right ventricular systolic function is normal. The right ventricular size is normal.  4. The mitral valve is normal in structure. Mild mitral valve regurgitation.  5. The aortic valve is grossly normal. Aortic valve regurgitation is not visualized. FINDINGS  Left Ventricle: Left ventricular ejection fraction, by estimation, is 50 to 55%. The left ventricle has low normal function. The left ventricle has no regional wall motion abnormalities. The left ventricular internal cavity size was normal in size. There is borderline left ventricular hypertrophy. Left ventricular diastolic parameters were normal. Right Ventricle: The right ventricular size is normal. No increase in right ventricular wall thickness. Right ventricular systolic function is normal. Left Atrium: Left atrial size was normal in size. Right Atrium: Right atrial size was normal in size.  Pericardium: There is no evidence of pericardial effusion. Mitral Valve: The mitral valve is normal in structure. Mild mitral valve regurgitation. MV peak gradient, 3.3 mmHg. The mean mitral valve gradient is 1.0 mmHg. Tricuspid Valve: The tricuspid valve is normal in structure. Tricuspid valve regurgitation is mild.  Aortic Valve: The aortic valve is grossly normal. Aortic valve regurgitation is not visualized. Aortic valve mean gradient measures 5.0 mmHg. Aortic valve peak gradient measures 8.6 mmHg. Aortic valve area, by VTI measures 4.33 cm. Pulmonic Valve: The pulmonic valve was grossly normal. Pulmonic valve regurgitation is not visualized. Aorta: The ascending aorta was not well visualized. IAS/Shunts: No atrial level shunt detected by color flow Doppler. Additional Comments: Normal LVF.  LEFT VENTRICLE PLAX 2D LVIDd:         4.70 cm  Diastology LVIDs:         3.60 cm  LV e' medial:    6.31 cm/s LV PW:         1.70 cm  LV E/e' medial:  13.6 LV IVS:        1.30 cm  LV e' lateral:   5.98 cm/s LVOT diam:     2.40 cm  LV E/e' lateral: 14.4 LV SV:         122 LV SV Index:   54 LVOT Area:     4.52 cm  RIGHT VENTRICLE RV Basal diam:  2.80 cm TAPSE (M-mode): 2.6 cm LEFT ATRIUM             Index       RIGHT ATRIUM           Index LA diam:        4.60 cm 2.03 cm/m  RA Area:     14.10 cm LA Vol (A2C):   70.9 ml 31.26 ml/m RA Volume:   34.80 ml  15.34 ml/m LA Vol (A4C):   49.3 ml 21.74 ml/m LA Biplane Vol: 64.4 ml 28.39 ml/m  AORTIC VALVE                    PULMONIC VALVE AV Area (Vmax):    4.28 cm     PV Vmax:       0.86 m/s AV Area (Vmean):   4.57 cm     PV Vmean:      57.300 cm/s AV Area (VTI):     4.33 cm     PV VTI:        0.140 m AV Vmax:           147.00 cm/s  PV Peak grad:  3.0 mmHg AV Vmean:          110.000 cm/s PV Mean grad:  2.0 mmHg AV VTI:            0.282 m AV Peak Grad:      8.6 mmHg AV Mean Grad:      5.0 mmHg LVOT Vmax:         139.00 cm/s LVOT Vmean:        111.000 cm/s LVOT VTI:           0.270 m LVOT/AV VTI ratio: 0.96  AORTA Ao Root diam: 3.80 cm MITRAL VALVE MV Area (PHT): 4.01 cm    SHUNTS MV Area VTI:   5.17 cm    Systemic VTI:  0.27 m MV Peak grad:  3.3 mmHg    Systemic Diam: 2.40 cm MV Mean grad:  1.0 mmHg MV Vmax:       0.90 m/s MV Vmean:      50.6 cm/s MV Decel Time: 189 msec MV E velocity: 86.10 cm/s MV A velocity: 46.30 cm/s MV E/A ratio:  1.86 Pieter Fooks D Laquenta Whitsell MD Electronically signed by Yolonda Kida MD Signature Date/Time: 06/03/2021/2:02:21 PM  Final    CT Angio Chest/Abd/Pel for Dissection W and/or Wo Contrast  Result Date: 06/02/2021 CLINICAL DATA:  Centralized chest pain radiating to arm. Vomiting. Difficulty speaking. EXAM: CT ANGIOGRAPHY CHEST, ABDOMEN AND PELVIS TECHNIQUE: Non-contrast CT of the chest was initially obtained. Multidetector CT imaging through the chest, abdomen and pelvis was performed using the standard protocol during bolus administration of intravenous contrast. Multiplanar reconstructed images and MIPs were obtained and reviewed to evaluate the vascular anatomy. CONTRAST:  131m OMNIPAQUE IOHEXOL 350 MG/ML SOLN COMPARISON:  None. FINDINGS: CTA CHEST FINDINGS Cardiovascular: No aortic intramural hematoma. No aortic dissection. Heart size within normal limits. Mediastinum/Nodes: No enlarged mediastinal, hilar, or axillary lymph nodes. Thyroid gland, trachea, and esophagus demonstrate no significant findings. Lungs/Pleura: Evaluation of the lungs, particularly the lower lobes to motion. No focal airspace opacity pneumonia. No pneumothorax or pleural effusion. Musculoskeletal: Evaluation limited by motion. No acute abnormality. Review of the MIP images confirms the above findings. CTA ABDOMEN AND PELVIS FINDINGS VASCULAR Aorta: Normal caliber aorta without aneurysm, dissection, vasculitis or significant stenosis. Celiac: Motion limits evaluation celiac artery. No significant abnormality is suspected. SMA: Evaluation limited to motion.  Abnormality is  suspected. Renals: 2 main right renal artery, 1 originating from the aorta and other origin from left common iliac artery. 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 No significant abnormality left main. IMA: Patent without evidence of aneurysm, dissection, vasculitis or significant stenosis. Inflow: Patent without evidence of aneurysm, dissection, vasculitis or significant stenosis. Veins: No obvious venous abnormality within the limitations of this arterial phase study. Review of the MIP images confirms the above findings. NON-VASCULAR Hepatobiliary: Evaluation limited due to motion. No acute abnormality. Pancreas: No acute abnormality. Spleen: Evaluation significantly limited due to motion. No acute abnormality. Adrenals/Urinary Tract: Adrenal glands are unremarkable. 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 Otherwise no significant abnormality of the kidneys. Bladder is unremarkable. Stomach/Bowel: Colonic diverticulosis without evidence of acute diverticulitis. The appendix is not identified. Lymphatic: No enlarged abdominal or pelvic lymph nodes. Reproductive: Enlarged nodular prostate impressing on the bladder neck. Other: No abdominal wall hernia or abnormality. No abdominopelvic ascites. Musculoskeletal: No acute or significant osseous findings. Review of the MIP images confirms the above findings. IMPRESSION: 1. No acute abnormality of the chest, abdomen, or pelvis. No acute abnormality of the aorta. 2. Colonic diverticulosis. 3. Evaluation of the lower chest and upper abdomen significantly limited due to motion. Electronically Signed   By: FMiachel RouxM.D.   On: 06/02/2021 10:29     Echo preserved left ventricular function with mild apical hypo-EF of at least 50 to 55%  TELEMETRY: Normal sinus rhythm rate of 70:  ASSESSMENT AND PLAN:  Active Problems:   STEMI (ST elevation myocardial infarction)  [place] post PCI and stent to circumflex Multivessel coronary artery disease Hyperlipidemia Hypertension GERD . Plan Post STEMI date x2 Patient is a significant improvement no symptoms feels back to normal no chest pain no shortness of breath Recommend discharge today follow-up 1 to 2 weeks in the office Will refer the patient to cardiac rehab Once patient seen in the office will determine scheduled to return to work  Cardiac medications on discharge Recommend aspirin 81 mg daily Ticagrelor 90 mg twice a day Losartan 50 mg daily Metoprolol tartrate 50 mg daily Lipitor 80 mg daily  Have the patient follow-up with cardiology 1 to 2 weeks [employer]  Yolonda Kida, MD 06/04/2021 9:01 AM

## 2021-06-04 NOTE — Progress Notes (Signed)
PHARMACY CONSULT NOTE - FOLLOW UP  Pharmacy Consult for Electrolyte Monitoring and Replacement   Recent Labs: Potassium (mmol/L)  Date Value  06/04/2021 3.7   Magnesium (mg/dL)  Date Value  06/04/2021 2.1   Calcium (mg/dL)  Date Value  06/04/2021 8.7 (L)   Albumin (g/dL)  Date Value  06/02/2021 4.4   Phosphorus (mg/dL)  Date Value  06/03/2021 3.4   Sodium (mmol/L)  Date Value  06/04/2021 135     Assessment: 60 year old male with STEMI s/p PCI to circumflex. Overnight 9/8-9/9 patient had runs of v.tach. PCCM consult for electrolyte management given recurrent arrhythmias.  Goal of Therapy:  K > 4, Mg > 2  Plan:  -No replacement at this time --f/u electrolytes with morning labs  Noralee Space, PharmD Clinical Pharmacist 06/04/2021 11:44 AM

## 2021-06-04 NOTE — Discharge Summary (Signed)
Physician Discharge Summary  JAYCOB RAMAKRISHNAN [id number] DOB: [birth year] DOA: 06/02/2021  PCP: Venia Carbon, MD  Admit date: 06/02/2021 Discharge date: 06/04/2021  Admitted From: Home Disposition: Home  Recommendations for Outpatient Follow-up:  Follow up with PCP in 1 week with repeat CBC/BMP Outpatient follow-up with cardiology Follow up in ED if symptoms worsen or new appear   Home Health: No Equipment/Devices: None  Discharge Condition: Stable CODE STATUS: Full Diet recommendation: Heart healthy  Brief/Interim Summary: 60 year old male with history of hypertension, hyperlipidemia, obstructive sleep apnea presented on 06/02/2021 with chest pain.  He was found to have STEMI for which he underwent cardiac catheterization with PCI and DES to mid left circumflex.  Postprocedure, he was hypotensive and required low-dose Levophed and transferred to ICU.  He was subsequently transferred to [facility] service on 06/03/2021.  Subsequently, patient has remained hemodynamically stable.  Cardiology has cleared the patient for discharge home.  He will be discharged home today with outpatient follow-up with PCP and cardiology.  Discharge Diagnoses:   STEMI -Status post cardiac cath with PCI and DES to mid circumflex on 06/02/2021 -Cardiology following and has cleared the patient for discharge home today.  Continue aspirin, statin, metoprolol and Brilinta.  Echo showed EF of 50 to 55%. -Patient was hypotensive postprocedure requiring low-dose Levophed which has already been discontinued.   Hypertension -Blood pressure currently stable.  Continue metoprolol and losartan.  Hyperlipidemia -Continue statin  Leukocytosis -Possibly reactive.  Improving  Hypokalemia -Improved  OSA -Continue CPAP nightly  Discharge Instructions  Discharge Instructions     AMB Referral to Cardiac Rehabilitation - Phase II   Complete by: As directed    Diagnosis: STEMI   After initial evaluation and  assessments completed: Virtual Based Care may be provided alone or in conjunction with Phase 2 Cardiac Rehab based on patient barriers.: Yes   Diet - low sodium heart healthy   Complete by: As directed    Increase activity slowly   Complete by: As directed       Allergies as of 06/04/2021   No Known Allergies      Medication List     STOP taking these medications    bisoprolol-hydrochlorothiazide 2.5-6.25 MG tablet Commonly known as: ZIAC   sildenafil 20 MG tablet Commonly known as: REVATIO       TAKE these medications    aspirin 81 MG chewable tablet Chew 1 tablet (81 mg total) by mouth daily. Start taking on: June 05, 2021   atorvastatin 80 MG tablet Commonly known as: LIPITOR Take 1 tablet (80 mg total) by mouth daily. Start taking on: June 05, 2021   DULoxetine 30 MG capsule Commonly known as: CYMBALTA TAKE 1 CAPSULE BY MOUTH ONCE DAILY   famotidine 40 MG tablet Commonly known as: PEPCID Take 1 tablet (40 mg total) by mouth at bedtime.   losartan 50 MG tablet Commonly known as: COZAAR Take 1 tablet (50 mg total) by mouth daily. Start taking on: June 05, 2021   metoprolol tartrate 50 MG tablet Commonly known as: LOPRESSOR Take 1 tablet (50 mg total) by mouth 2 (two) times daily.   nitroGLYCERIN 0.4 MG SL tablet Commonly known as: NITROSTAT Place 1 tablet (0.4 mg total) under the tongue every 5 (five) minutes as needed for chest pain.   ticagrelor 90 MG Tabs tablet Commonly known as: BRILINTA Take 1 tablet (90 mg total) by mouth 2 (two) times daily.        Follow-up Information  Venia Carbon, MD. Schedule an appointment as soon as possible for a visit in 1 week(s).   Specialties: Internal Medicine, Pediatrics Why: with cbc/bmp Contact information: [place] [address] [phone]         Yolonda Kida, MD. Schedule an appointment as soon as possible for a visit in 1 week(s).    Specialties: Cardiology, Internal Medicine Contact information: [place] [address] [phone]                No Known Allergies  Consultations: Cardiology   Procedures/Studies: DG Chest 2 View  Result Date: 06/02/2021 CLINICAL DATA:  cp EXAM: CHEST - 2 VIEW COMPARISON:  None. FINDINGS: The cardiomediastinal silhouette is within normal limits. No pleural effusion. No pneumothorax. No mass or consolidation. No acute osseous abnormality. IMPRESSION: No acute findings in the chest. Electronically Signed   By: Albin Felling M.D.   On: 06/02/2021 08:27   CARDIAC CATHETERIZATION  Result Date: 06/03/2021   Prox Cx to Mid Cx lesion is 100% stenosed.   Prox LAD lesion is 75% stenosed.   Mid LAD lesion is 80% stenosed.   Dist LAD lesion is 25% stenosed.   Dist Cx-1 lesion is 75% stenosed.   Dist Cx-2 lesion is 90% stenosed.   A drug-eluting stent was successfully placed using a STENT ONYX FRONTIER 3.0X34.   Balloon angioplasty was performed using a BALLOON TREK RX 2.5X12.   Post intervention, there is a 0% residual stenosis.   Post intervention, there is a 50% residual stenosis.   There is mild left ventricular systolic dysfunction.   LV end diastolic pressure is mildly elevated.   The left ventricular ejection fraction is 50-55% by visual estimate. Conclusion Successful PCI and stent to mid circumflex STEMI presentation with restoration of TIMI-3 flow from TIMI 0 lesion from 100% down to 0% stenosis LAD with 75% proximal lesion 80% mid which we will defer for now and staged at a later date RCA large and free of disease LV function was reasonably preserved between 50 and 55% with mild apical hypo- Patient to be maintained on aspirin Brilinta for at least 12 months Aggrastat for 18 hours Anticipate discharge after 3 to 4 days Transferred to ICU for critical care management   ECHOCARDIOGRAM COMPLETE  Result Date: 06/03/2021    ECHOCARDIOGRAM REPORT   Patient Name:   FLAVIL MCMORRAN Date of Exam: 06/03/2021 Medical Rec #:  [id number]        Height:       73.0 in Accession #:    [id number]       Weight:       226.4 lb Date of Birth:  [birth year]       BSA:          2.268 m Patient Age:    60 years         BP:           121/78 mmHg Patient Gender: M                HR:           73 bpm. Exam Location:  [facility] Procedure: 2D Echo, Color Doppler, Cardiac Doppler and Strain Analysis Indications:     I21.9 Acute myocardial infarction, unspecified  History:         Patient has no prior history of Echocardiogram examinations.  Arrythmias:RBBB; Risk Factors:Hypertension and Sleep Apnea.  Sonographer:     Charmayne Sheer Referring Phys:  [id number] DWAYNE D CALLWOOD Diagnosing Phys: Yolonda Kida MD  Sonographer Comments: Global longitudinal strain was attempted. IMPRESSIONS  1. Normal LVF.  2. Left ventricular ejection fraction, by estimation, is 50 to 55%. The left ventricle has low normal function. The left ventricle has no regional wall motion abnormalities. Left ventricular diastolic parameters were normal.  3. Right ventricular systolic function is normal. The right ventricular size is normal.  4. The mitral valve is normal in structure. Mild mitral valve regurgitation.  5. The aortic valve is grossly normal. Aortic valve regurgitation is not visualized. FINDINGS  Left Ventricle: Left ventricular ejection fraction, by estimation, is 50 to 55%. The left ventricle has low normal function. The left ventricle has no regional wall motion abnormalities. The left ventricular internal cavity size was normal in size. There is borderline left ventricular hypertrophy. Left ventricular diastolic parameters were normal. Right Ventricle: The right ventricular size is normal. No increase in right ventricular wall thickness. Right ventricular systolic function is normal. Left Atrium: Left atrial size was normal in size. Right Atrium: Right atrial size was normal in size. Pericardium: There is no  evidence of pericardial effusion. Mitral Valve: The mitral valve is normal in structure. Mild mitral valve regurgitation. MV peak gradient, 3.3 mmHg. The mean mitral valve gradient is 1.0 mmHg. Tricuspid Valve: The tricuspid valve is normal in structure. Tricuspid valve regurgitation is mild. Aortic Valve: The aortic valve is grossly normal. Aortic valve regurgitation is not visualized. Aortic valve mean gradient measures 5.0 mmHg. Aortic valve peak gradient measures 8.6 mmHg. Aortic valve area, by VTI measures 4.33 cm. Pulmonic Valve: The pulmonic valve was grossly normal. Pulmonic valve regurgitation is not visualized. Aorta: The ascending aorta was not well visualized. IAS/Shunts: No atrial level shunt detected by color flow Doppler. Additional Comments: Normal LVF.  LEFT VENTRICLE PLAX 2D LVIDd:         4.70 cm  Diastology LVIDs:         3.60 cm  LV e' medial:    6.31 cm/s LV PW:         1.70 cm  LV E/e' medial:  13.6 LV IVS:        1.30 cm  LV e' lateral:   5.98 cm/s LVOT diam:     2.40 cm  LV E/e' lateral: 14.4 LV SV:         122 LV SV Index:   54 LVOT Area:     4.52 cm  RIGHT VENTRICLE RV Basal diam:  2.80 cm TAPSE (M-mode): 2.6 cm LEFT ATRIUM             Index       RIGHT ATRIUM           Index LA diam:        4.60 cm 2.03 cm/m  RA Area:     14.10 cm LA Vol (A2C):   70.9 ml 31.26 ml/m RA Volume:   34.80 ml  15.34 ml/m LA Vol (A4C):   49.3 ml 21.74 ml/m LA Biplane Vol: 64.4 ml 28.39 ml/m  AORTIC VALVE                    PULMONIC VALVE AV Area (Vmax):    4.28 cm     PV Vmax:       0.86 m/s AV Area (Vmean):   4.57 cm  PV Vmean:      57.300 cm/s AV Area (VTI):     4.33 cm     PV VTI:        0.140 m AV Vmax:           147.00 cm/s  PV Peak grad:  3.0 mmHg AV Vmean:          110.000 cm/s PV Mean grad:  2.0 mmHg AV VTI:            0.282 m AV Peak Grad:      8.6 mmHg AV Mean Grad:      5.0 mmHg LVOT Vmax:         139.00 cm/s LVOT Vmean:        111.000 cm/s LVOT VTI:          0.270 m LVOT/AV VTI ratio:  0.96  AORTA Ao Root diam: 3.80 cm MITRAL VALVE MV Area (PHT): 4.01 cm    SHUNTS MV Area VTI:   5.17 cm    Systemic VTI:  0.27 m MV Peak grad:  3.3 mmHg    Systemic Diam: 2.40 cm MV Mean grad:  1.0 mmHg MV Vmax:       0.90 m/s MV Vmean:      50.6 cm/s MV Decel Time: 189 msec MV E velocity: 86.10 cm/s MV A velocity: 46.30 cm/s MV E/A ratio:  1.86 Dwayne D Callwood MD Electronically signed by Yolonda Kida MD Signature Date/Time: 06/03/2021/2:02:21 PM    Final    CT Angio Chest/Abd/Pel for Dissection W and/or Wo Contrast  Result Date: 06/02/2021 CLINICAL DATA:  Centralized chest pain radiating to arm. Vomiting. Difficulty speaking. EXAM: CT ANGIOGRAPHY CHEST, ABDOMEN AND PELVIS TECHNIQUE: Non-contrast CT of the chest was initially obtained. Multidetector CT imaging through the chest, abdomen and pelvis was performed using the standard protocol during bolus administration of intravenous contrast. Multiplanar reconstructed images and MIPs were obtained and reviewed to evaluate the vascular anatomy. CONTRAST:  134m OMNIPAQUE IOHEXOL 350 MG/ML SOLN COMPARISON:  None. FINDINGS: CTA CHEST FINDINGS Cardiovascular: No aortic intramural hematoma. No aortic dissection. Heart size within normal limits. Mediastinum/Nodes: No enlarged mediastinal, hilar, or axillary lymph nodes. Thyroid gland, trachea, and esophagus demonstrate no significant findings. Lungs/Pleura: Evaluation of the lungs, particularly the lower lobes to motion. No focal airspace opacity pneumonia. No pneumothorax or pleural effusion. Musculoskeletal: Evaluation limited by motion. No acute abnormality. Review of the MIP images confirms the above findings. CTA ABDOMEN AND PELVIS FINDINGS VASCULAR Aorta: Normal caliber aorta without aneurysm, dissection, vasculitis or significant stenosis. Celiac: Motion limits evaluation celiac artery. No significant abnormality is suspected. SMA: Evaluation limited to motion.  Abnormality is suspected. Renals: 2 main right  renal artery, 1 originating from the aorta and other origin from left common iliac artery. 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 No significant abnormality left main. IMA: Patent without evidence of aneurysm, dissection, vasculitis or significant stenosis. Inflow: Patent without evidence of aneurysm, dissection, vasculitis or significant stenosis. Veins: No obvious venous abnormality within the limitations of this arterial phase study. Review of the MIP images confirms the above findings. NON-VASCULAR Hepatobiliary: Evaluation limited due to motion. No acute abnormality. Pancreas: No acute abnormality. Spleen: Evaluation significantly limited due to motion. No acute abnormality. Adrenals/Urinary Tract: Adrenal glands are unremarkable. 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 Otherwise no significant abnormality of the kidneys. Bladder is unremarkable. Stomach/Bowel: Colonic diverticulosis  without evidence of acute diverticulitis. The appendix is not identified. Lymphatic: No enlarged abdominal or pelvic lymph nodes. Reproductive: Enlarged nodular prostate impressing on the bladder neck. Other: No abdominal wall hernia or abnormality. No abdominopelvic ascites. Musculoskeletal: No acute or significant osseous findings. Review of the MIP images confirms the above findings. IMPRESSION: 1. No acute abnormality of the chest, abdomen, or pelvis. No acute abnormality of the aorta. 2. Colonic diverticulosis. 3. Evaluation of the lower chest and upper abdomen significantly limited due to motion. Electronically Signed   By: Miachel Roux M.D.   On: 06/02/2021 10:29      Subjective: Patient seen and examined at bedside.  He feels much better and denies any worsening chest pain, shortness of breath, nausea or vomiting.  Feels okay to go home today.  Discharge Exam: Vitals:   06/04/21 0450 06/04/21 0800  BP: (!) 148/108  (!) 151/102  Pulse: 79 79  Resp: 18 15  Temp: 98 F (36.7 C) 98.2 F (36.8 C)  SpO2: 96% 96%    General: Pt is alert, awake, not in acute distress.  Currently on room air. Cardiovascular: rate controlled, S1/S2 + Respiratory: bilateral decreased breath sounds at bases Abdominal: Soft, NT, ND, bowel sounds + Extremities: no edema, no cyanosis    The results of significant diagnostics from this hospitalization (including imaging, microbiology, ancillary and laboratory) are listed below for reference.     Microbiology: Recent Results (from the past 240 hour(s))  Resp Panel by RT-PCR (Flu A&B, Covid) Nasopharyngeal Swab     Status: None   Collection Time: 06/02/21  8:14 AM   Specimen: Nasopharyngeal Swab; Nasopharyngeal(NP) swabs in vial transport medium  Result Value Ref Range Status   SARS Coronavirus 2 by RT PCR NEGATIVE NEGATIVE Final    Comment: (NOTE) SARS-CoV-2 target nucleic acids are NOT DETECTED.  The SARS-CoV-2 RNA is generally detectable in upper respiratory specimens during the acute phase of infection. The lowest concentration of SARS-CoV-2 viral copies this assay can detect is 138 copies/mL. A negative result does not preclude SARS-Cov-2 infection and should not be used as the sole basis for treatment or other patient management decisions. A negative result may occur with  improper specimen collection/handling, submission of specimen other than nasopharyngeal swab, presence of viral mutation(s) within the areas targeted by this assay, and inadequate number of viral copies(<138 copies/mL). A negative result must be combined with clinical observations, patient history, and epidemiological information. The expected result is Negative.  Fact Sheet for Patients:  EntrepreneurPulse.com.au  Fact Sheet for Healthcare Providers:  IncredibleEmployment.be  This test is no t yet approved or cleared by the [place] FDA and  has  been authorized for detection and/or diagnosis of SARS-CoV-2 by FDA under an Emergency Use Authorization (EUA). This EUA will remain  in effect (meaning this test can be used) for the duration of the COVID-19 declaration under Section 564(b)(1) of the Act, 21 U.S.C.section 360bbb-3(b)(1), unless the authorization is terminated  or revoked sooner.       Influenza A by PCR NEGATIVE NEGATIVE Final   Influenza B by PCR NEGATIVE NEGATIVE Final    Comment: (NOTE) The Xpert Xpress SARS-CoV-2/FLU/RSV plus assay is intended as an aid in the diagnosis of influenza from Nasopharyngeal swab specimens and should not be used as a sole basis for treatment. Nasal washings and aspirates are unacceptable for Xpert Xpress SARS-CoV-2/FLU/RSV testing.  Fact Sheet for Patients: EntrepreneurPulse.com.au  Fact Sheet for Healthcare Providers: IncredibleEmployment.be  This test is not yet  approved or cleared by the [place] and has been authorized for detection and/or diagnosis of SARS-CoV-2 by FDA under an Emergency Use Authorization (EUA). This EUA will remain in effect (meaning this test can be used) for the duration of the COVID-19 declaration under Section 564(b)(1) of the Act, 21 U.S.C. section 360bbb-3(b)(1), unless the authorization is terminated or revoked.  Performed at [facility], [address], [place] [address]   MRSA Next Gen by PCR, Nasal     Status: None   Collection Time: 06/02/21  3:29 PM   Specimen: Nasal Mucosa; Nasal Swab  Result Value Ref Range Status   MRSA by PCR Next Gen NOT DETECTED NOT DETECTED Final    Comment: (NOTE) The GeneXpert MRSA Assay (FDA approved for NASAL specimens only), is one component of a comprehensive MRSA colonization surveillance program. It is not intended to diagnose MRSA infection nor to guide or monitor treatment for MRSA infections. Test performance is not FDA approved in patients  less than 81 years old. Performed at [facility], [address] [place], [place] [address]      Labs: BNP (last 3 results) No results for input(s): BNP in the last 8760 hours. Basic Metabolic Panel: Recent Labs  Lab 06/02/21 0751 06/03/21 0102 06/04/21 0508  NA 135 134* 135  K 3.6 3.3* 3.7  CL 102 103 103  CO2 20* 22 28  GLUCOSE 155* 132* 119*  BUN '18 15 15  '$ CREATININE 1.16 0.85 0.98  CALCIUM 9.6 8.8* 8.7*  MG  --  2.0 2.1  PHOS  --  3.4  --    Liver Function Tests: Recent Labs  Lab 06/02/21 0751  AST 36  ALT 26  ALKPHOS 85  BILITOT 1.9*  PROT 7.3  ALBUMIN 4.4   Recent Labs  Lab 06/02/21 0751  LIPASE 30   No results for input(s): AMMONIA in the last 168 hours. CBC: Recent Labs  Lab 06/02/21 0751 06/03/21 0102 06/04/21 0508  WBC 12.0* 18.8* 14.5*  NEUTROABS  --   --  11.8*  HGB 16.3 15.0 14.0  HCT 48.2 42.9 41.2  MCV 85.2 86.8 87.8  PLT 267 245 186   Cardiac Enzymes: No results for input(s): CKTOTAL, CKMB, CKMBINDEX, TROPONINI in the last 168 hours. BNP: Invalid input(s): POCBNP CBG: Recent Labs  Lab 06/02/21 1440  GLUCAP 147*   D-Dimer No results for input(s): DDIMER in the last 72 hours. Hgb A1c Recent Labs    06/03/21 0102  HGBA1C 5.5   Lipid Profile Recent Labs    06/03/21 0102  CHOL 181  HDL 40*  LDLCALC 119*  TRIG 108  CHOLHDL 4.5   Thyroid function studies No results for input(s): TSH, T4TOTAL, T3FREE, THYROIDAB in the last 72 hours.  Invalid input(s): FREET3 Anemia work up No results for input(s): VITAMINB12, FOLATE, FERRITIN, TIBC, IRON, RETICCTPCT in the last 72 hours. Urinalysis No results found for: COLORURINE, APPEARANCEUR, [place], [place], GLUCOSEU, [place], [place], [place], PROTEINUR, UROBILINOGEN, NITRITE, LEUKOCYTESUR Sepsis Labs Invalid input(s): PROCALCITONIN,  WBC,  LACTICIDVEN Microbiology Recent Results (from the past 240 hour(s))  Resp Panel by RT-PCR (Flu A&B, Covid)  Nasopharyngeal Swab     Status: None   Collection Time: 06/02/21  8:14 AM   Specimen: Nasopharyngeal Swab; Nasopharyngeal(NP) swabs in vial transport medium  Result Value Ref Range Status   SARS Coronavirus 2 by RT PCR NEGATIVE NEGATIVE Final    Comment: (NOTE) SARS-CoV-2 target nucleic acids are NOT DETECTED.  The SARS-CoV-2  RNA is generally detectable in upper respiratory specimens during the acute phase of infection. The lowest concentration of SARS-CoV-2 viral copies this assay can detect is 138 copies/mL. A negative result does not preclude SARS-Cov-2 infection and should not be used as the sole basis for treatment or other patient management decisions. A negative result may occur with  improper specimen collection/handling, submission of specimen other than nasopharyngeal swab, presence of viral mutation(s) within the areas targeted by this assay, and inadequate number of viral copies(<138 copies/mL). A negative result must be combined with clinical observations, patient history, and epidemiological information. The expected result is Negative.  Fact Sheet for Patients:  EntrepreneurPulse.com.au  Fact Sheet for Healthcare Providers:  IncredibleEmployment.be  This test is no t yet approved or cleared by the [place] FDA and  has been authorized for detection and/or diagnosis of SARS-CoV-2 by FDA under an Emergency Use Authorization (EUA). This EUA will remain  in effect (meaning this test can be used) for the duration of the COVID-19 declaration under Section 564(b)(1) of the Act, 21 U.S.C.section 360bbb-3(b)(1), unless the authorization is terminated  or revoked sooner.       Influenza A by PCR NEGATIVE NEGATIVE Final   Influenza B by PCR NEGATIVE NEGATIVE Final    Comment: (NOTE) The Xpert Xpress SARS-CoV-2/FLU/RSV plus assay is intended as an aid in the diagnosis of influenza from Nasopharyngeal swab specimens and should not be  used as a sole basis for treatment. Nasal washings and aspirates are unacceptable for Xpert Xpress SARS-CoV-2/FLU/RSV testing.  Fact Sheet for Patients: EntrepreneurPulse.com.au  Fact Sheet for Healthcare Providers: IncredibleEmployment.be  This test is not yet approved or cleared by the [place] FDA and has been authorized for detection and/or diagnosis of SARS-CoV-2 by FDA under an Emergency Use Authorization (EUA). This EUA will remain in effect (meaning this test can be used) for the duration of the COVID-19 declaration under Section 564(b)(1) of the Act, 21 U.S.C. section 360bbb-3(b)(1), unless the authorization is terminated or revoked.  Performed at [facility], [address] [place], [place] [address]   MRSA Next Gen by PCR, Nasal     Status: None   Collection Time: 06/02/21  3:29 PM   Specimen: Nasal Mucosa; Nasal Swab  Result Value Ref Range Status   MRSA by PCR Next Gen NOT DETECTED NOT DETECTED Final    Comment: (NOTE) The GeneXpert MRSA Assay (FDA approved for NASAL specimens only), is one component of a comprehensive MRSA colonization surveillance program. It is not intended to diagnose MRSA infection nor to guide or monitor treatment for MRSA infections. Test performance is not FDA approved in patients less than 52 years old. Performed at [facility], [address] [place], [place] [address]      Time coordinating discharge: 35 minutes  SIGNED:   Aline August, MD  [facility] 06/04/2021, 10:46 AM

## 2021-06-06 ENCOUNTER — Telehealth: Payer: Self-pay

## 2021-06-06 NOTE — Telephone Encounter (Signed)
Spoke to pt. He said he is feeling pretty good after hospital stay. Made HFU for 06-09-21. He is aware it is in [place].

## 2021-06-08 ENCOUNTER — Telehealth: Payer: Self-pay

## 2021-06-08 NOTE — Telephone Encounter (Signed)
Transition Care Management Follow-up Telephone Call Date of discharge and from where: 06/04/21 [facility] How have you been since you were released from the hospital? "I been doing good, I feel good" Any questions or concerns? No  Items Reviewed: Did the pt receive and understand the discharge instructions provided? Yes  Medications obtained and verified? Yes  Other? no Any new allergies since your discharge? no Dietary orders reviewed? Yes Do you have support at home? Yes   Home Care and Equipment/Supplies: Were home health services ordered? no If so, what is the name of the agency? na  Has the agency set up a time to come to the patient's home? not applicable Were any new equipment or medical supplies ordered?  No What is the name of the medical supply agency? na Were you able to get the supplies/equipment? not applicable Do you have any questions related to the use of the equipment or supplies? No  Functional Questionnaire: (I = Independent and D = Dependent) ADLs: I  Bathing/Dressing- I  Meal Prep- Assistance   Eating- I  Maintaining continence- I  Transferring/Ambulation- I  Managing Meds- I  Follow up appointments reviewed:  PCP Hospital f/u appt confirmed? Yes  Scheduled to see Dr. Silvio Pate on 06/08/21 @ 1:45. [facility] f/u appt confirmed? Yes  Scheduled to see Dr. Clayborn Bigness on 06/13/21@ 2pm. Are transportation arrangements needed? No  If their condition worsens, is the pt aware to call PCP or go to the Emergency Dept.? Yes Was the patient provided with contact information for the PCP's office or ED? Yes Was to pt encouraged to call back with questions or concerns? Yes    Thea Silversmith, RN, MSN, BSN, [facility] Care Management Coordinator [phone]

## 2021-06-09 ENCOUNTER — Ambulatory Visit (INDEPENDENT_AMBULATORY_CARE_PROVIDER_SITE_OTHER): Payer: BC Managed Care – PPO | Admitting: Internal Medicine

## 2021-06-09 ENCOUNTER — Encounter: Payer: Self-pay | Admitting: Internal Medicine

## 2021-06-09 ENCOUNTER — Other Ambulatory Visit: Payer: Self-pay

## 2021-06-09 DIAGNOSIS — I10 Essential (primary) hypertension: Secondary | ICD-10-CM | POA: Diagnosis not present

## 2021-06-09 DIAGNOSIS — I25119 Atherosclerotic heart disease of native coronary artery with unspecified angina pectoris: Secondary | ICD-10-CM

## 2021-06-09 DIAGNOSIS — I2510 Atherosclerotic heart disease of native coronary artery without angina pectoris: Secondary | ICD-10-CM | POA: Insufficient documentation

## 2021-06-09 DIAGNOSIS — K219 Gastro-esophageal reflux disease without esophagitis: Secondary | ICD-10-CM

## 2021-06-09 MED ORDER — FAMOTIDINE 40 MG PO TABS: 40.0000 mg | ORAL_TABLET | Freq: Two times a day (BID) | ORAL | 3 refills | Status: DC

## 2021-06-09 NOTE — Progress Notes (Signed)
Subjective:    Patient ID: Alex [name], male    DOB: 1961-06-20, 60 y.o.   MRN: [id number]  HPI Here with wife for hospital follow up This visit occurred during the SARS-CoV-2 public health emergency.  Safety protocols were in place, including screening questions prior to the visit, additional usage of staff PPE, and extensive cleaning of exam room while observing appropriate contact time as indicated for disinfecting solutions.   Woke up to pee a week ago Has soreness from sternum to left chest Throbbing spot over left arm He wouldn't let wife called EMS---so she drove him Felt better---so they drove home Next morning--started driving to work and symptoms recurred Drove himself to ER----barely made it Code STEMI---had cath by [facility] PCI and DES to mid circumflex Other significant disease--especially LAD---Rx deferred Hypotensive after procedure--briefly to ICU and needed levophed Weaned off and did go home On brilinta, statin, metoprolol, losartan, asa  Home for 5 days No chest or arm pain Slight SOB  Some anxiety---not surprising  Current Outpatient Medications on File Prior to Visit  Medication Sig Dispense Refill   aspirin 81 MG chewable tablet Chew 1 tablet (81 mg total) by mouth daily. 30 tablet 0   atorvastatin (LIPITOR) 80 MG tablet Take 1 tablet (80 mg total) by mouth daily. 30 tablet 0   DULoxetine (CYMBALTA) 30 MG capsule TAKE 1 CAPSULE BY MOUTH ONCE DAILY 90 capsule 3   famotidine (PEPCID) 40 MG tablet Take 1 tablet (40 mg total) by mouth at bedtime. 90 tablet 3   losartan (COZAAR) 50 MG tablet Take 1 tablet (50 mg total) by mouth daily. 30 tablet 0   metoprolol tartrate (LOPRESSOR) 50 MG tablet Take 1 tablet (50 mg total) by mouth 2 (two) times daily. 60 tablet 0   nitroGLYCERIN (NITROSTAT) 0.4 MG SL tablet Place 1 tablet (0.4 mg total) under the tongue every 5 (five) minutes as needed for chest pain. 30 tablet 0   ticagrelor (BRILINTA) 90 MG TABS tablet  Take 1 tablet (90 mg total) by mouth 2 (two) times daily. 60 tablet 0   No current facility-administered medications on file prior to visit.    No Known Allergies  Past Medical History:  Diagnosis Date   HTN (hypertension)    OSA (obstructive sleep apnea)    PSG 09/23/10 AHI 22   RBBB (right bundle branch block)     Past Surgical History:  Procedure Laterality Date   APPENDECTOMY  1988   CORONARY/GRAFT ACUTE MI REVASCULARIZATION N/A 06/02/2021   Procedure: Coronary/Graft Acute MI Revascularization;  Surgeon: Yolonda Kida, MD;  Location: [facility];  Service: Cardiovascular;  Laterality: N/A;   fracture L arm     LEFT HEART CATH AND CORONARY ANGIOGRAPHY N/A 06/02/2021   Procedure: LEFT HEART CATH AND CORONARY ANGIOGRAPHY;  Surgeon: Yolonda Kida, MD;  Location: [facility];  Service: Cardiovascular;  Laterality: N/A;   TONSILLECTOMY     VASECTOMY  1997     Family History  Problem Relation Age of Onset   Stroke Father 66       doing okay   Skin cancer Mother    Stroke Paternal Grandfather        and PGGF   Hypertension Other        dad's side    Diabetes Neg Hx        DM    Coronary artery disease Neg Hx    Prostate cancer Neg Hx  no colon cancer    Colon cancer Neg Hx     Social History   Socioeconomic History   Marital status: Married    Spouse name: Not on file   Number of children: 2   Years of education: Not on file   Highest education level: Not on file  Occupational History   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   [employer].        Social Determinants of Health   Financial Resource Strain: Not on file  Food Insecurity: Not on file  Transportation Needs: Not on file  Physical Activity: Not on file  Stress: Not on file  Social Connections: Not on file  Intimate Partner Violence: Not on file   Review of Systems Sleeping okay with the CPAP Appetite is fine Emotions have been controlled----did have some firing up in hospital (but hadn't had his cymbalta for a couple of days) Did have some heartburn--now controlled on daily famotidine     Objective:   Physical Exam Constitutional:      Appearance: Normal appearance.  Cardiovascular:     Rate and Rhythm: Normal rate and regular rhythm.     Heart sounds: No murmur heard.   No gallop.  Pulmonary:     Effort: Pulmonary effort is normal.     Breath sounds: Normal breath sounds. No wheezing or rales.  Abdominal:     Palpations: Abdomen is soft.     Tenderness: There is no abdominal tenderness.  Musculoskeletal:     Cervical back: Neck supple.     Right lower leg: No edema.     Left lower leg: No edema.  Lymphadenopathy:     Cervical: No cervical adenopathy.  Neurological:     Mental Status: He is alert.           Assessment & Plan:

## 2021-06-09 NOTE — Addendum Note (Signed)
Addended by: Viviana Simpler I on: 06/09/2021 02:44 PM   Modules accepted: Orders

## 2021-06-09 NOTE — Patient Instructions (Signed)

## 2021-06-09 NOTE — Assessment & Plan Note (Signed)
Discussed being aggressive with this so he doesn't have chest pain Will increase famotidine to twice a day

## 2021-06-09 NOTE — Assessment & Plan Note (Addendum)
Recent ST elevation MI---had PCI and DES Some SOB ---could be from [place] has sig blockages---may need another procedure On beta blocker, statin, ARB, brilinta, ASA Going back to cardiology next week Discussed avoiding physical activity for a while (6 weeks?) May be candidate for cardiac rehab

## 2021-06-09 NOTE — Assessment & Plan Note (Signed)
BP Readings from Last 3 Encounters:  06/09/21 132/90  06/04/21 (!) 151/102  03/14/21 140/86   Reasonable control

## 2021-06-27 ENCOUNTER — Other Ambulatory Visit: Payer: Self-pay

## 2021-06-27 ENCOUNTER — Encounter: Payer: BC Managed Care – PPO | Attending: Internal Medicine

## 2021-06-27 DIAGNOSIS — Z48812 Encounter for surgical aftercare following surgery on the circulatory system: Secondary | ICD-10-CM | POA: Insufficient documentation

## 2021-06-27 DIAGNOSIS — I213 ST elevation (STEMI) myocardial infarction of unspecified site: Secondary | ICD-10-CM

## 2021-06-27 DIAGNOSIS — Z955 Presence of coronary angioplasty implant and graft: Secondary | ICD-10-CM

## 2021-06-27 DIAGNOSIS — I252 Old myocardial infarction: Secondary | ICD-10-CM | POA: Insufficient documentation

## 2021-06-27 NOTE — Progress Notes (Signed)
Virtual Visit completed. Patient informed on EP and RD appointment and 6 Minute walk test. Patient also informed of patient health questionnaires on My Chart. Patient Verbalizes understanding. Visit diagnosis can be found in [facility] 06/02/2021.

## 2021-07-04 ENCOUNTER — Other Ambulatory Visit: Payer: Self-pay

## 2021-07-04 ENCOUNTER — Encounter: Payer: BC Managed Care – PPO | Admitting: *Deleted

## 2021-07-04 VITALS — Ht 74.25 in | Wt 215.6 lb

## 2021-07-04 DIAGNOSIS — Z955 Presence of coronary angioplasty implant and graft: Secondary | ICD-10-CM

## 2021-07-04 DIAGNOSIS — I213 ST elevation (STEMI) myocardial infarction of unspecified site: Secondary | ICD-10-CM

## 2021-07-04 DIAGNOSIS — I252 Old myocardial infarction: Secondary | ICD-10-CM | POA: Diagnosis not present

## 2021-07-04 DIAGNOSIS — Z48812 Encounter for surgical aftercare following surgery on the circulatory system: Secondary | ICD-10-CM | POA: Diagnosis not present

## 2021-07-04 NOTE — Progress Notes (Signed)
Cardiac Individual Treatment Plan  Patient Details  Name: Alex [name] MRN: [id number] Date of Birth: [birth year] Referring Provider:   Flowsheet Row Cardiac Rehab from 07/04/2021 in [facility] Cardiac and Pulmonary Rehab  Referring Provider callwood       Initial Encounter Date:  Flowsheet Row Cardiac Rehab from 07/04/2021 in [facility] Cardiac and Pulmonary Rehab  Date 07/04/21       Visit Diagnosis: ST elevation myocardial infarction (STEMI), unspecified artery [facility])  Status post coronary artery stent placement  Patient's Home Medications on Admission:  Current Outpatient Medications:    aspirin 81 MG chewable tablet, Chew 1 tablet (81 mg total) by mouth daily., Disp: 30 tablet, Rfl: 0   atorvastatin (LIPITOR) 80 MG tablet, Take 1 tablet (80 mg total) by mouth daily., Disp: 30 tablet, Rfl: 0   bisoprolol-hydrochlorothiazide (ZIAC) 2.5-6.25 MG tablet, Take 1 tablet by mouth daily., Disp: , Rfl:    DULoxetine (CYMBALTA) 30 MG capsule, TAKE 1 CAPSULE BY MOUTH ONCE DAILY, Disp: 90 capsule, Rfl: 3   famotidine (PEPCID) 40 MG tablet, Take 1 tablet (40 mg total) by mouth 2 (two) times daily., Disp: 180 tablet, Rfl: 3   losartan (COZAAR) 50 MG tablet, Take 1 tablet (50 mg total) by mouth daily., Disp: 30 tablet, Rfl: 0   metoprolol tartrate (LOPRESSOR) 50 MG tablet, Take 1 tablet (50 mg total) by mouth 2 (two) times daily., Disp: 60 tablet, Rfl: 0   nitroGLYCERIN (NITROSTAT) 0.4 MG SL tablet, Place 1 tablet (0.4 mg total) under the tongue every 5 (five) minutes as needed for chest pain., Disp: 30 tablet, Rfl: 0   ticagrelor (BRILINTA) 90 MG TABS tablet, Take 1 tablet (90 mg total) by mouth 2 (two) times daily., Disp: 60 tablet, Rfl: 0  Past Medical History: Past Medical History:  Diagnosis Date   HTN (hypertension)    OSA (obstructive sleep apnea)    PSG 09/23/10 AHI 22   RBBB (right bundle branch block)     Tobacco Use: Social History   Tobacco Use  Smoking Status Never   Smokeless Tobacco Former   Types: Chew   Quit date: 11/24/2015  Tobacco Comments   rare cigarillo     Labs: Recent Review Flowsheet Data     Labs for ITP Cardiac and Pulmonary Rehab Latest Ref Rng & Units 01/03/2010 01/12/2014 02/18/2020 06/03/2021   Cholestrol 0 - 200 mg/dL 189 173 187 181   LDLCALC 0 - 99 mg/dL 123(H) 119(H) 110(H) 119(H)   HDL >40 mg/dL 37.40(L) 33.50(L) 40.50 40(L)   Trlycerides <150 mg/dL 145.0 102.0 180.0(H) 108   Hemoglobin A1c 4.8 - 5.6 % - 5.5 - 5.5        Exercise Target Goals: Exercise Program Goal: Individual exercise prescription set using results from initial 6 min walk test and THRR while considering  patient's activity barriers and safety.   Exercise Prescription Goal: Initial exercise prescription builds to 30-45 minutes a day of aerobic activity, 2-3 days per week.  Home exercise guidelines will be given to patient during program as part of exercise prescription that the participant will acknowledge.   Education: Aerobic Exercise: - Group verbal and visual presentation on the components of exercise prescription. Introduces F.I.T.T principle from ACSM for exercise prescriptions.  Reviews F.I.T.T. principles of aerobic exercise including progression. Written material given at graduation.   Education: Resistance Exercise: - Group verbal and visual presentation on the components of exercise prescription. Introduces F.I.T.T principle from ACSM for exercise prescriptions  Reviews F.I.T.T. principles of  resistance exercise including progression. Written material given at graduation.    Education: Exercise & Equipment Safety: - Individual verbal instruction and demonstration of equipment use and safety with use of the equipment. Flowsheet Row Cardiac Rehab from 07/04/2021 in [facility] Cardiac and Pulmonary Rehab  Date 07/04/21  Educator [facility]  Instruction Review Code 1- Verbalizes Understanding       Education: Exercise Physiology & General Exercise  Guidelines: - Group verbal and written instruction with models to review the exercise physiology of the cardiovascular system and associated critical values. Provides general exercise guidelines with specific guidelines to those with heart or lung disease.  Flowsheet Row Cardiac Rehab from 07/04/2021 in [facility] Cardiac and Pulmonary Rehab  Education need identified 07/04/21       Education: Flexibility, Balance, Mind/Body Relaxation: - Group verbal and visual presentation with interactive activity on the components of exercise prescription. Introduces F.I.T.T principle from ACSM for exercise prescriptions. Reviews F.I.T.T. principles of flexibility and balance exercise training including progression. Also discusses the mind body connection.  Reviews various relaxation techniques to help reduce and manage stress (i.e. Deep breathing, progressive muscle relaxation, and visualization). Balance handout provided to take home. Written material given at graduation.   Activity Barriers & Risk Stratification:   6 Minute Walk:  6 Minute Walk     Row Name 07/04/21 1658         6 Minute Walk   Phase Initial     Distance 1415 feet     Walk Time 6 minutes     # of Rest Breaks 0     MPH 2.68     METS 3.72     RPE 11     VO2 Peak 13.02     Symptoms No     Resting HR 68 bpm     Resting BP 138/80     Resting Oxygen Saturation  96 %     Exercise Oxygen Saturation  during 6 min walk 96 %     Max Ex. HR 80 bpm     Max Ex. BP 140/70     2 Minute Post BP 130/78              Oxygen Initial Assessment:   Oxygen Re-Evaluation:   Oxygen Discharge (Final Oxygen Re-Evaluation):   Initial Exercise Prescription:  Initial Exercise Prescription - 07/04/21 1600       Date of Initial Exercise RX and Referring Provider   Date 07/04/21    Referring Provider callwood      Oxygen   Maintain Oxygen Saturation 88% or higher      Treadmill   MPH 2.5    Grade 0    Minutes 15    METs 2.91       NuStep   Level 3    SPM 80    Minutes 15    METs 3.72      Elliptical   Level 1    Speed 3    Minutes 15      Biostep-RELP   Level 2    SPM 50    Minutes 15    METs 3.72      Prescription Details   Frequency (times per week) 2    Duration Progress to 30 minutes of continuous aerobic without signs/symptoms of physical distress      Intensity   THRR 40-80% of Max Heartrate 105-143    Ratings of Perceived Exertion 11-13    Perceived Dyspnea 0-4  Progression   Progression Continue to progress workloads to maintain intensity without signs/symptoms of physical distress.      Resistance Training   Training Prescription Yes    Weight 4    Reps 10-15             Perform Capillary Blood Glucose checks as needed.  Exercise Prescription Changes:   Exercise Prescription Changes     Row Name 07/04/21 1700             Response to Exercise   Blood Pressure (Admit) 138/80       Blood Pressure (Exercise) 140/70       Blood Pressure (Exit) 130/78       Heart Rate (Admit) 68 bpm       Heart Rate (Exercise) 80 bpm       Heart Rate (Exit) 69 bpm       Oxygen Saturation (Admit) 96 %       Oxygen Saturation (Exercise) 96 %       Rating of Perceived Exertion (Exercise) 11       Symptoms none       Comments walk test results                Exercise Comments:   Exercise Goals and Review:   Exercise Goals     Row Name 07/04/21 1704             Exercise Goals   Increase Physical Activity Yes       Intervention Provide advice, education, support and counseling about physical activity/exercise needs.;Develop an individualized exercise prescription for aerobic and resistive training based on initial evaluation findings, risk stratification, comorbidities and participant's personal goals.       Expected Outcomes Short Term: Attend rehab on a regular basis to increase amount of physical activity.;Long Term: Add in home exercise to make exercise part of  routine and to increase amount of physical activity.;Long Term: Exercising regularly at least 3-5 days a week.       Increase Strength and Stamina Yes       Intervention Provide advice, education, support and counseling about physical activity/exercise needs.;Develop an individualized exercise prescription for aerobic and resistive training based on initial evaluation findings, risk stratification, comorbidities and participant's personal goals.       Expected Outcomes Short Term: Increase workloads from initial exercise prescription for resistance, speed, and METs.;Short Term: Perform resistance training exercises routinely during rehab and add in resistance training at home;Long Term: Improve cardiorespiratory fitness, muscular endurance and strength as measured by increased METs and functional capacity (6MWT)       Able to understand and use rate of perceived exertion (RPE) scale Yes       Intervention Provide education and explanation on how to use RPE scale       Expected Outcomes Short Term: Able to use RPE daily in rehab to express subjective intensity level;Long Term:  Able to use RPE to guide intensity level when exercising independently       Able to understand and use Dyspnea scale Yes       Intervention Provide education and explanation on how to use Dyspnea scale       Expected Outcomes Short Term: Able to use Dyspnea scale daily in rehab to express subjective sense of shortness of breath during exertion;Long Term: Able to use Dyspnea scale to guide intensity level when exercising independently       Knowledge and understanding of Target Heart Rate  Range (THRR) Yes       Intervention Provide education and explanation of THRR including how the numbers were predicted and where they are located for reference       Expected Outcomes Short Term: Able to state/look up THRR;Long Term: Able to use THRR to govern intensity when exercising independently;Short Term: Able to use daily as guideline for  intensity in rehab       Able to check pulse independently Yes       Intervention Provide education and demonstration on how to check pulse in carotid and radial arteries.;Review the importance of being able to check your own pulse for safety during independent exercise       Expected Outcomes Short Term: Able to explain why pulse checking is important during independent exercise;Long Term: Able to check pulse independently and accurately       Understanding of Exercise Prescription Yes       Intervention Provide education, explanation, and written materials on patient's individual exercise prescription       Expected Outcomes Short Term: Able to explain program exercise prescription;Long Term: Able to explain home exercise prescription to exercise independently                Exercise Goals Re-Evaluation :   Discharge Exercise Prescription (Final Exercise Prescription Changes):  Exercise Prescription Changes - 07/04/21 1700       Response to Exercise   Blood Pressure (Admit) 138/80    Blood Pressure (Exercise) 140/70    Blood Pressure (Exit) 130/78    Heart Rate (Admit) 68 bpm    Heart Rate (Exercise) 80 bpm    Heart Rate (Exit) 69 bpm    Oxygen Saturation (Admit) 96 %    Oxygen Saturation (Exercise) 96 %    Rating of Perceived Exertion (Exercise) 11    Symptoms none    Comments walk test results             Nutrition:  Target Goals: Understanding of nutrition guidelines, daily intake of sodium 1500mg , cholesterol 200mg , calories 30% from fat and 7% or less from saturated fats, daily to have 5 or more servings of fruits and vegetables.  Education: All About Nutrition: -Group instruction provided by verbal, written material, interactive activities, discussions, models, and posters to present general guidelines for heart healthy nutrition including fat, fiber, MyPlate, the role of sodium in heart healthy nutrition, utilization of the nutrition label, and utilization of  this knowledge for meal planning. Follow up email sent as well. Written material given at graduation. Flowsheet Row Cardiac Rehab from 07/04/2021 in [facility] Cardiac and Pulmonary Rehab  Education need identified 07/04/21       Biometrics:  Pre Biometrics - 07/04/21 1704       Pre Biometrics   Height 6' 2.25" (1.886 m)    Weight 215 lb 9.6 oz (97.8 kg)    BMI (Calculated) 27.49              Nutrition Therapy Plan and Nutrition Goals:  Nutrition Therapy & Goals - 07/04/21 1705       Intervention Plan   Intervention Prescribe, educate and counsel regarding individualized specific dietary modifications aiming towards targeted core components such as weight, hypertension, lipid management, diabetes, heart failure and other comorbidities.    Expected Outcomes Short Term Goal: Understand basic principles of dietary content, such as calories, fat, sodium, cholesterol and nutrients.;Long Term Goal: Adherence to prescribed nutrition plan.;Short Term Goal: A plan has been developed with personal nutrition  goals set during dietitian appointment.             Nutrition Assessments:  MEDIFICTS Score Key: ?70 Need to make dietary changes  40-70 Heart Healthy Diet ? 40 Therapeutic Level Cholesterol Diet  Flowsheet Row Cardiac Rehab from 07/04/2021 in [facility] Cardiac and Pulmonary Rehab  Picture Your Plate Total Score on Admission 39      Picture Your Plate Scores: <71 Unhealthy dietary pattern with much room for improvement. 41-50 Dietary pattern unlikely to meet recommendations for good health and room for improvement. 51-60 More healthful dietary pattern, with some room for improvement.  >60 Healthy dietary pattern, although there may be some specific behaviors that could be improved.    Nutrition Goals Re-Evaluation:   Nutrition Goals Discharge (Final Nutrition Goals Re-Evaluation):   Psychosocial: Target Goals: Acknowledge presence or absence of significant depression  and/or stress, maximize coping skills, provide positive support system. Participant is able to verbalize types and ability to use techniques and skills needed for reducing stress and depression.   Education: Stress, Anxiety, and Depression - Group verbal and visual presentation to define topics covered.  Reviews how body is impacted by stress, anxiety, and depression.  Also discusses healthy ways to reduce stress and to treat/manage anxiety and depression.  Written material given at graduation.   Education: Sleep Hygiene -Provides group verbal and written instruction about how sleep can affect your health.  Define sleep hygiene, discuss sleep cycles and impact of sleep habits. Review good sleep hygiene tips.    Initial Review & Psychosocial Screening:  Initial Psych Review & Screening - 06/27/21 1015       Initial Review   Current issues with Current Anxiety/Panic      Family Dynamics   Good Support System? Yes    Comments Sherren Mocha can look to his wife Pamala Hurry for support. He can talk to them over the phone if he needs support since they live out of town.      Barriers   Psychosocial barriers to participate in program There are no identifiable barriers or psychosocial needs.;The patient should benefit from training in stress management and relaxation.      Screening Interventions   Interventions Encouraged to exercise;To provide support and resources with identified psychosocial needs;Provide feedback about the scores to participant    Expected Outcomes Short Term goal: Utilizing psychosocial counselor, staff and physician to assist with identification of specific Stressors or current issues interfering with healing process. Setting desired goal for each stressor or current issue identified.;Short Term goal: Identification and review with participant of any Quality of Life or Depression concerns found by scoring the questionnaire.;Long Term Goal: Stressors or current issues are controlled or  eliminated.;Long Term goal: The participant improves quality of Life and PHQ9 Scores as seen by post scores and/or verbalization of changes             Quality of Life Scores:   Quality of Life - 07/04/21 1708       Quality of Life   Select Quality of Life      Quality of Life Scores   Health/Function Pre 25.93 %    Socioeconomic Pre 24.25 %    Psych/Spiritual Pre 25.5 %    Family Pre 28.5 %    GLOBAL Pre 25.83 %            Scores of 19 and below usually indicate a poorer quality of life in these areas.  A difference of  2-3 points is a  clinically meaningful difference.  A difference of 2-3 points in the total score of the Quality of Life Index has been associated with significant improvement in overall quality of life, self-image, physical symptoms, and general health in studies assessing change in quality of life.  PHQ-9: Recent Review Flowsheet Data     Depression screen [facility] 2/9 07/04/2021 03/14/2021 02/06/2019   Decreased Interest 1 0 0   Down, Depressed, Hopeless 2 0 0   PHQ - 2 Score 3 0 0   Altered sleeping 0 - -   Tired, decreased energy 1 - -   Change in appetite 1 - -   Feeling bad or failure about yourself  0 - -   Trouble concentrating 0 - -   Moving slowly or fidgety/restless 0 - -   Suicidal thoughts 0 - -   PHQ-9 Score 5 - -   Difficult doing work/chores Not difficult at all - -      Interpretation of Total Score  Total Score Depression Severity:  1-4 = Minimal depression, 5-9 = Mild depression, 10-14 = Moderate depression, 15-19 = Moderately severe depression, 20-27 = Severe depression   Psychosocial Evaluation and Intervention:  Psychosocial Evaluation - 06/27/21 1019       Psychosocial Evaluation & Interventions   Interventions Encouraged to exercise with the program and follow exercise prescription;Stress management education;Relaxation education    Comments Sherren Mocha can look to his wife Pamala Hurry for support. He can talk to them over the phone if  he needs support since they live out of town.    Expected Outcomes Short: Start [facility] to help with mood. Long: Maintain a healthy mental state    Continue Psychosocial Services  Follow up required by staff             Psychosocial Re-Evaluation:   Psychosocial Discharge (Final Psychosocial Re-Evaluation):   Vocational Rehabilitation: Provide vocational rehab assistance to qualifying candidates.   Vocational Rehab Evaluation & Intervention:   Education: Education Goals: Education classes will be provided on a variety of topics geared toward better understanding of heart health and risk factor modification. Participant will state understanding/return demonstration of topics presented as noted by education test scores.  Learning Barriers/Preferences:  Learning Barriers/Preferences - 06/27/21 1015       Learning Barriers/Preferences   Learning Barriers None    Learning Preferences None             General Cardiac Education Topics:  AED/CPR: - Group verbal and written instruction with the use of models to demonstrate the basic use of the AED with the basic ABC's of resuscitation.   Anatomy and Cardiac Procedures: - Group verbal and visual presentation and models provide information about basic cardiac anatomy and function. Reviews the testing methods done to diagnose heart disease and the outcomes of the test results. Describes the treatment choices: Medical Management, Angioplasty, or Coronary Bypass Surgery for treating various heart conditions including Myocardial Infarction, Angina, Valve Disease, and Cardiac Arrhythmias.  Written material given at graduation. Flowsheet Row Cardiac Rehab from 07/04/2021 in [facility] Cardiac and Pulmonary Rehab  Education need identified 07/04/21       Medication Safety: - Group verbal and visual instruction to review commonly prescribed medications for heart and lung disease. Reviews the medication, class of the drug, and side  effects. Includes the steps to properly store meds and maintain the prescription regimen.  Written material given at graduation.   Intimacy: - Group verbal instruction through game format to discuss how heart  and lung disease can affect sexual intimacy. Written material given at graduation..   Know Your Numbers and Heart Failure: - Group verbal and visual instruction to discuss disease risk factors for cardiac and pulmonary disease and treatment options.  Reviews associated critical values for Overweight/Obesity, Hypertension, Cholesterol, and Diabetes.  Discusses basics of heart failure: signs/symptoms and treatments.  Introduces Heart Failure Zone chart for action plan for heart failure.  Written material given at graduation.   Infection Prevention: - Provides verbal and written material to individual with discussion of infection control including proper hand washing and proper equipment cleaning during exercise session. Flowsheet Row Cardiac Rehab from 07/04/2021 in [facility] Cardiac and Pulmonary Rehab  Date 07/04/21  Educator [facility]  Instruction Review Code 1- Verbalizes Understanding       Falls Prevention: - Provides verbal and written material to individual with discussion of falls prevention and safety. Flowsheet Row Cardiac Rehab from 07/04/2021 in [facility] Cardiac and Pulmonary Rehab  Date 07/04/21  Educator [facility]  Instruction Review Code 1- Verbalizes Understanding       Other: -Provides group and verbal instruction on various topics (see comments)   Knowledge Questionnaire Score:  Knowledge Questionnaire Score - 07/04/21 1708       Knowledge Questionnaire Score   Pre Score 23/26             Core Components/Risk Factors/Patient Goals at Admission:  Personal Goals and Risk Factors at Admission - 07/04/21 1705       Core Components/Risk Factors/Patient Goals on Admission    Weight Management Yes;Weight Loss    Intervention Weight Management: Develop a combined  nutrition and exercise program designed to reach desired caloric intake, while maintaining appropriate intake of nutrient and fiber, sodium and fats, and appropriate energy expenditure required for the weight goal.;Weight Management: Provide education and appropriate resources to help participant work on and attain dietary goals.;Weight Management/Obesity: Establish reasonable short term and long term weight goals.    Admit Weight 215 lb 9.6 oz (97.8 kg)    Goal Weight: Short Term 210 lb (95.3 kg)    Goal Weight: Long Term 205 lb (93 kg)    Expected Outcomes Short Term: Continue to assess and modify interventions until short term weight is achieved;Long Term: Adherence to nutrition and physical activity/exercise program aimed toward attainment of established weight goal;Weight Maintenance: Understanding of the daily nutrition guidelines, which includes 25-35% calories from fat, 7% or less cal from saturated fats, less than 200mg  cholesterol, less than 1.5gm of sodium, & 5 or more servings of fruits and vegetables daily;Weight Loss: Understanding of general recommendations for a balanced deficit meal plan, which promotes 1-2 lb weight loss per week and includes a negative energy balance of [phone] kcal/d;Understanding recommendations for meals to include 15-35% energy as protein, 25-35% energy from fat, 35-60% energy from carbohydrates, less than 200mg  of dietary cholesterol, 20-35 gm of total fiber daily;Understanding of distribution of calorie intake throughout the day with the consumption of 4-5 meals/snacks    Hypertension Yes    Intervention Provide education on lifestyle modifcations including regular physical activity/exercise, weight management, moderate sodium restriction and increased consumption of fresh fruit, vegetables, and low fat dairy, alcohol moderation, and smoking cessation.;Monitor prescription use compliance.    Expected Outcomes Short Term: Continued assessment and intervention until BP  is < 140/76mm HG in hypertensive participants. < 130/74mm HG in hypertensive participants with diabetes, heart failure or chronic kidney disease.    Lipids Yes    Intervention Provide education and  support for participant on nutrition & aerobic/resistive exercise along with prescribed medications to achieve LDL 70mg , HDL >40mg .    Expected Outcomes Short Term: Participant states understanding of desired cholesterol values and is compliant with medications prescribed. Participant is following exercise prescription and nutrition guidelines.;Long Term: Cholesterol controlled with medications as prescribed, with individualized exercise RX and with personalized nutrition plan. Value goals: LDL < 70mg , HDL > 40 mg.             Education:Diabetes - Individual verbal and written instruction to review signs/symptoms of diabetes, desired ranges of glucose level fasting, after meals and with exercise. Acknowledge that pre and post exercise glucose checks will be done for 3 sessions at entry of program.   Core Components/Risk Factors/Patient Goals Review:    Core Components/Risk Factors/Patient Goals at Discharge (Final Review):    ITP Comments:  ITP Comments     Row Name 06/27/21 1013 07/04/21 1657         ITP Comments Virtual Visit completed. Patient informed on EP and RD appointment and 6 Minute walk test. Patient also informed of patient health questionnaires on My Chart. Patient Verbalizes understanding. Visit diagnosis can be found in [facility] 06/02/[facility]21. Completed 6MWT and gym orientation. Initial ITP created and sent for review to Dr. Emily Filbert, Medical Director.               Comments: Initial ITP

## 2021-07-04 NOTE — Patient Instructions (Signed)
Patient Instructions  Patient Details  Name: Alex [name] MRN: [id number] Date of Birth: 1961/03/15 Referring Provider:  Yolonda Kida, MD  Below are your personal goals for exercise, nutrition, and risk factors. Our goal is to help you stay on track towards obtaining and maintaining these goals. We will be discussing your progress on these goals with you throughout the program.  Initial Exercise Prescription:  Initial Exercise Prescription - 07/04/21 1600       Date of Initial Exercise RX and Referring Provider   Date 07/04/21    Referring Provider [place]      Oxygen   Maintain Oxygen Saturation 88% or higher      Treadmill   MPH 2.5    Grade 0    Minutes 15    METs 2.91      NuStep   Level 3    SPM 80    Minutes 15    METs 3.72      Elliptical   Level 1    Speed 3    Minutes 15      Biostep-RELP   Level 2    SPM 50    Minutes 15    METs 3.72      Prescription Details   Frequency (times per week) 2    Duration Progress to 30 minutes of continuous aerobic without signs/symptoms of physical distress      Intensity   THRR 40-80% of Max Heartrate 105-143    Ratings of Perceived Exertion 11-13    Perceived Dyspnea 0-4      Progression   Progression Continue to progress workloads to maintain intensity without signs/symptoms of physical distress.      Resistance Training   Training Prescription Yes    Weight 4    Reps 10-15             Exercise Goals: Frequency: Be able to perform aerobic exercise two to three times per week in program working toward 2-5 days per week of home exercise.  Intensity: Work with a perceived exertion of 11 (fairly light) - 15 (hard) while following your exercise prescription.  We will make changes to your prescription with you as you progress through the program.   Duration: Be able to do 30 to 45 minutes of continuous aerobic exercise in addition to a 5 minute warm-up and a 5 minute cool-down routine.    Nutrition Goals: Your personal nutrition goals will be established when you do your nutrition analysis with the dietician.  The following are general nutrition guidelines to follow: Cholesterol < 200mg /day Sodium < 1500mg /day Fiber: Men over 50 yrs - 30 grams per day  Personal Goals:  Personal Goals and Risk Factors at Admission - 07/04/21 1705       Core Components/Risk Factors/Patient Goals on Admission    Weight Management Yes;Weight Loss    Intervention Weight Management: Develop a combined nutrition and exercise program designed to reach desired caloric intake, while maintaining appropriate intake of nutrient and fiber, sodium and fats, and appropriate energy expenditure required for the weight goal.;Weight Management: Provide education and appropriate resources to help participant work on and attain dietary goals.;Weight Management/Obesity: Establish reasonable short term and long term weight goals.    Admit Weight 215 lb 9.6 oz (97.8 kg)    Goal Weight: Short Term 210 lb (95.3 kg)    Goal Weight: Long Term 205 lb (93 kg)    Expected Outcomes Short Term: Continue to assess and modify interventions until  short term weight is achieved;Long Term: Adherence to nutrition and physical activity/exercise program aimed toward attainment of established weight goal;Weight Maintenance: Understanding of the daily nutrition guidelines, which includes 25-35% calories from fat, 7% or less cal from saturated fats, less than 200mg  cholesterol, less than 1.5gm of sodium, & 5 or more servings of fruits and vegetables daily;Weight Loss: Understanding of general recommendations for a balanced deficit meal plan, which promotes 1-2 lb weight loss per week and includes a negative energy balance of [phone] kcal/d;Understanding recommendations for meals to include 15-35% energy as protein, 25-35% energy from fat, 35-60% energy from carbohydrates, less than 200mg  of dietary cholesterol, 20-35 gm of total fiber  daily;Understanding of distribution of calorie intake throughout the day with the consumption of 4-5 meals/snacks    Hypertension Yes    Intervention Provide education on lifestyle modifcations including regular physical activity/exercise, weight management, moderate sodium restriction and increased consumption of fresh fruit, vegetables, and low fat dairy, alcohol moderation, and smoking cessation.;Monitor prescription use compliance.    Expected Outcomes Short Term: Continued assessment and intervention until BP is < 140/41mm HG in hypertensive participants. < 130/45mm HG in hypertensive participants with diabetes, heart failure or chronic kidney disease.    Lipids Yes    Intervention Provide education and support for participant on nutrition & aerobic/resistive exercise along with prescribed medications to achieve LDL 70mg , HDL >40mg .    Expected Outcomes Short Term: Participant states understanding of desired cholesterol values and is compliant with medications prescribed. Participant is following exercise prescription and nutrition guidelines.;Long Term: Cholesterol controlled with medications as prescribed, with individualized exercise RX and with personalized nutrition plan. Value goals: LDL < 70mg , HDL > 40 mg.             Tobacco Use Initial Evaluation: Social History   Tobacco Use  Smoking Status Never  Smokeless Tobacco Former   Types: Chew   Quit date: 11/24/2015  Tobacco Comments   rare cigarillo     Exercise Goals and Review:  Exercise Goals     Row Name 07/04/21 1704             Exercise Goals   Increase Physical Activity Yes       Intervention Provide advice, education, support and counseling about physical activity/exercise needs.;Develop an individualized exercise prescription for aerobic and resistive training based on initial evaluation findings, risk stratification, comorbidities and participant's personal goals.       Expected Outcomes Short Term: Attend  rehab on a regular basis to increase amount of physical activity.;Long Term: Add in home exercise to make exercise part of routine and to increase amount of physical activity.;Long Term: Exercising regularly at least 3-5 days a week.       Increase Strength and Stamina Yes       Intervention Provide advice, education, support and counseling about physical activity/exercise needs.;Develop an individualized exercise prescription for aerobic and resistive training based on initial evaluation findings, risk stratification, comorbidities and participant's personal goals.       Expected Outcomes Short Term: Increase workloads from initial exercise prescription for resistance, speed, and METs.;Short Term: Perform resistance training exercises routinely during rehab and add in resistance training at home;Long Term: Improve cardiorespiratory fitness, muscular endurance and strength as measured by increased METs and functional capacity (6MWT)       Able to understand and use rate of perceived exertion (RPE) scale Yes       Intervention Provide education and explanation on how to use RPE scale  Expected Outcomes Short Term: Able to use RPE daily in rehab to express subjective intensity level;Long Term:  Able to use RPE to guide intensity level when exercising independently       Able to understand and use Dyspnea scale Yes       Intervention Provide education and explanation on how to use Dyspnea scale       Expected Outcomes Short Term: Able to use Dyspnea scale daily in rehab to express subjective sense of shortness of breath during exertion;Long Term: Able to use Dyspnea scale to guide intensity level when exercising independently       Knowledge and understanding of Target Heart Rate Range (THRR) Yes       Intervention Provide education and explanation of THRR including how the numbers were predicted and where they are located for reference       Expected Outcomes Short Term: Able to state/look up  THRR;Long Term: Able to use THRR to govern intensity when exercising independently;Short Term: Able to use daily as guideline for intensity in rehab       Able to check pulse independently Yes       Intervention Provide education and demonstration on how to check pulse in carotid and radial arteries.;Review the importance of being able to check your own pulse for safety during independent exercise       Expected Outcomes Short Term: Able to explain why pulse checking is important during independent exercise;Long Term: Able to check pulse independently and accurately       Understanding of Exercise Prescription Yes       Intervention Provide education, explanation, and written materials on patient's individual exercise prescription       Expected Outcomes Short Term: Able to explain program exercise prescription;Long Term: Able to explain home exercise prescription to exercise independently                Copy of goals given to participant.

## 2021-07-06 ENCOUNTER — Other Ambulatory Visit: Payer: Self-pay

## 2021-07-06 DIAGNOSIS — Z48812 Encounter for surgical aftercare following surgery on the circulatory system: Secondary | ICD-10-CM | POA: Diagnosis not present

## 2021-07-06 DIAGNOSIS — Z955 Presence of coronary angioplasty implant and graft: Secondary | ICD-10-CM

## 2021-07-06 DIAGNOSIS — I213 ST elevation (STEMI) myocardial infarction of unspecified site: Secondary | ICD-10-CM

## 2021-07-06 NOTE — Progress Notes (Signed)
Daily Session Note  Patient Details  Name: Alex [name] MRN: [id number] Date of Birth: [birth year] Referring Provider:   Flowsheet Row Cardiac Rehab from 07/04/2021 in [facility] Cardiac and Pulmonary Rehab  Referring Provider [place]       Encounter Date: 07/06/2021  Check In:  Session Check In - 07/06/21 0937       Check-In   Supervising physician immediately available to respond to emergencies See telemetry face sheet for immediately available ER MD    Location [facility]-Cardiac & Pulmonary Rehab    Staff Present Birdie Sons, MPA, Elveria Rising, BA, ACSM CEP, Exercise Physiologist;Joseph Tessie Fass, [place]    Virtual Visit No    Medication changes reported     No    Fall or balance concerns reported    No    Warm-up and Cool-down Performed on first and last piece of equipment    Resistance Training Performed Yes    VAD Patient? No    PAD/SET Patient? No      Pain Assessment   Currently in Pain? No/denies                Social History   Tobacco Use  Smoking Status Never  Smokeless Tobacco Former   Types: Chew   Quit date: 11/24/2015  Tobacco Comments   rare cigarillo     Goals Met:  Independence with exercise equipment Exercise tolerated well No report of concerns or symptoms today Strength training completed today  Goals Unmet:  Not Applicable  Comments: First full day of exercise!  Patient was oriented to gym and equipment including functions, settings, policies, and procedures.  Patient's individual exercise prescription and treatment plan were reviewed.  All starting workloads were established based on the results of the 6 minute walk test done at initial orientation visit.  The plan for exercise progression was also introduced and progression will be customized based on patient's performance and goals.     Dr. Emily Filbert is Medical Director for [place].  Dr. Ottie Glazier is Medical Director for [facility] Pulmonary  Rehabilitation.

## 2021-07-08 ENCOUNTER — Other Ambulatory Visit: Payer: Self-pay

## 2021-07-08 DIAGNOSIS — Z48812 Encounter for surgical aftercare following surgery on the circulatory system: Secondary | ICD-10-CM | POA: Diagnosis not present

## 2021-07-08 DIAGNOSIS — Z955 Presence of coronary angioplasty implant and graft: Secondary | ICD-10-CM

## 2021-07-08 DIAGNOSIS — I213 ST elevation (STEMI) myocardial infarction of unspecified site: Secondary | ICD-10-CM

## 2021-07-08 NOTE — Progress Notes (Signed)
Daily Session Note  Patient Details  Name: Alex [name] MRN: [id number] Date of Birth: 1960/11/24 Referring Provider:   Flowsheet Row Cardiac Rehab from 07/04/2021 in [facility] Cardiac and Pulmonary Rehab  Referring Provider [place]       Encounter Date: 07/08/2021  Check In:  Session Check In - 07/08/21 0915       Check-In   Supervising physician immediately available to respond to emergencies See telemetry face sheet for immediately available ER MD    Location [facility]-Cardiac & Pulmonary Rehab    Staff Present Birdie Sons, MPA, RN;Jessica Luan Pulling, MA, RCEP, CCRP, CCET;Joseph [place], [place]    Virtual Visit No    Medication changes reported     No    Fall or balance concerns reported    No    Warm-up and Cool-down Performed on first and last piece of equipment    Resistance Training Performed Yes    VAD Patient? No    PAD/SET Patient? No      Pain Assessment   Currently in Pain? No/denies                Social History   Tobacco Use  Smoking Status Never  Smokeless Tobacco Former   Types: Chew   Quit date: 11/24/2015  Tobacco Comments   rare cigarillo     Goals Met:  Independence with exercise equipment Exercise tolerated well No report of concerns or symptoms today Strength training completed today  Goals Unmet:  Not Applicable  Comments: Pt able to follow exercise prescription today without complaint.  Will continue to monitor for progression.    Dr. Emily Filbert is Medical Director for [facility].  Dr. Ottie Glazier is Medical Director for [facility] Pulmonary Rehabilitation.

## 2021-07-11 ENCOUNTER — Other Ambulatory Visit: Payer: Self-pay

## 2021-07-11 ENCOUNTER — Encounter: Payer: BC Managed Care – PPO | Admitting: *Deleted

## 2021-07-11 DIAGNOSIS — Z955 Presence of coronary angioplasty implant and graft: Secondary | ICD-10-CM

## 2021-07-11 DIAGNOSIS — I213 ST elevation (STEMI) myocardial infarction of unspecified site: Secondary | ICD-10-CM

## 2021-07-11 DIAGNOSIS — Z48812 Encounter for surgical aftercare following surgery on the circulatory system: Secondary | ICD-10-CM | POA: Diagnosis not present

## 2021-07-11 NOTE — Progress Notes (Signed)
Daily Session Note  Patient Details  Name: Alex [name] MRN: [id number] Date of Birth: [birth year] Referring Provider:   Flowsheet Row Cardiac Rehab from 07/04/2021 in [facility] Cardiac and Pulmonary Rehab  Referring Provider [place]       Encounter Date: 07/11/2021  Check In:  Session Check In - 07/11/21 0927       Check-In   Supervising physician immediately available to respond to emergencies See telemetry face sheet for immediately available ER MD    Location [facility]-Cardiac & Pulmonary Rehab    Staff Present Heath Lark, RN, BSN, CCRP;Amanda Sommer, BA, ACSM CEP, Exercise Physiologist;Joseph [place], [place]    Virtual Visit No    Medication changes reported     No    Fall or balance concerns reported    No    Warm-up and Cool-down Performed on first and last piece of equipment    Resistance Training Performed Yes    VAD Patient? No    PAD/SET Patient? No      Pain Assessment   Currently in Pain? No/denies                Social History   Tobacco Use  Smoking Status Never  Smokeless Tobacco Former   Types: Chew   Quit date: 11/24/2015  Tobacco Comments   rare cigarillo     Goals Met:  Independence with exercise equipment Exercise tolerated well No report of concerns or symptoms today  Goals Unmet:  Not Applicable  Comments: Pt able to follow exercise prescription today without complaint.  Will continue to monitor for progression.    Dr. Emily Filbert is Medical Director for [place].  Dr. Ottie Glazier is Medical Director for [facility] Pulmonary Rehabilitation.

## 2021-07-12 ENCOUNTER — Telehealth: Payer: Self-pay

## 2021-07-12 DIAGNOSIS — I213 ST elevation (STEMI) myocardial infarction of unspecified site: Secondary | ICD-10-CM

## 2021-07-12 DIAGNOSIS — Z955 Presence of coronary angioplasty implant and graft: Secondary | ICD-10-CM

## 2021-07-12 NOTE — Progress Notes (Signed)
Cardiac Individual Treatment Plan  Patient Details  Name: Alex [name] MRN: [id number] Date of Birth: 1960-11-15 Referring Provider:   Flowsheet Row Cardiac Rehab from 07/04/2021 in [facility] Cardiac and Pulmonary Rehab  Referring Provider callwood       Initial Encounter Date:  Flowsheet Row Cardiac Rehab from 07/04/2021 in [facility] Cardiac and Pulmonary Rehab  Date 07/04/21       Visit Diagnosis: ST elevation myocardial infarction (STEMI), unspecified artery [facility])  Status post coronary artery stent placement  Patient's Home Medications on Admission:  Current Outpatient Medications:    aspirin 81 MG chewable tablet, Chew 1 tablet (81 mg total) by mouth daily., Disp: 30 tablet, Rfl: 0   atorvastatin (LIPITOR) 80 MG tablet, Take 1 tablet (80 mg total) by mouth daily., Disp: 30 tablet, Rfl: 0   bisoprolol-hydrochlorothiazide (ZIAC) 2.5-6.25 MG tablet, Take 1 tablet by mouth daily., Disp: , Rfl:    DULoxetine (CYMBALTA) 30 MG capsule, TAKE 1 CAPSULE BY MOUTH ONCE DAILY, Disp: 90 capsule, Rfl: 3   famotidine (PEPCID) 40 MG tablet, Take 1 tablet (40 mg total) by mouth 2 (two) times daily., Disp: 180 tablet, Rfl: 3   losartan (COZAAR) 50 MG tablet, Take 1 tablet (50 mg total) by mouth daily., Disp: 30 tablet, Rfl: 0   metoprolol tartrate (LOPRESSOR) 50 MG tablet, Take 1 tablet (50 mg total) by mouth 2 (two) times daily., Disp: 60 tablet, Rfl: 0   nitroGLYCERIN (NITROSTAT) 0.4 MG SL tablet, Place 1 tablet (0.4 mg total) under the tongue every 5 (five) minutes as needed for chest pain., Disp: 30 tablet, Rfl: 0   ticagrelor (BRILINTA) 90 MG TABS tablet, Take 1 tablet (90 mg total) by mouth 2 (two) times daily., Disp: 60 tablet, Rfl: 0  Past Medical History: Past Medical History:  Diagnosis Date   HTN (hypertension)    OSA (obstructive sleep apnea)    PSG 09/23/10 AHI 22   RBBB (right bundle branch block)     Tobacco Use: Social History   Tobacco Use  Smoking Status Never   Smokeless Tobacco Former   Types: Chew   Quit date: 11/24/2015  Tobacco Comments   rare cigarillo     Labs: Recent Review Flowsheet Data     Labs for ITP Cardiac and Pulmonary Rehab Latest Ref Rng & Units 01/03/2010 01/12/2014 02/18/2020 06/03/2021   Cholestrol 0 - 200 mg/dL 189 173 187 181   LDLCALC 0 - 99 mg/dL 123(H) 119(H) 110(H) 119(H)   HDL >40 mg/dL 37.40(L) 33.50(L) 40.50 40(L)   Trlycerides <150 mg/dL 145.0 102.0 180.0(H) 108   Hemoglobin A1c 4.8 - 5.6 % - 5.5 - 5.5        Exercise Target Goals: Exercise Program Goal: Individual exercise prescription set using results from initial 6 min walk test and THRR while considering  patient's activity barriers and safety.   Exercise Prescription Goal: Initial exercise prescription builds to 30-45 minutes a day of aerobic activity, 2-3 days per week.  Home exercise guidelines will be given to patient during program as part of exercise prescription that the participant will acknowledge.   Education: Aerobic Exercise: - Group verbal and visual presentation on the components of exercise prescription. Introduces F.I.T.T principle from ACSM for exercise prescriptions.  Reviews F.I.T.T. principles of aerobic exercise including progression. Written material given at graduation.   Education: Resistance Exercise: - Group verbal and visual presentation on the components of exercise prescription. Introduces F.I.T.T principle from ACSM for exercise prescriptions  Reviews F.I.T.T. principles of  resistance exercise including progression. Written material given at graduation.    Education: Exercise & Equipment Safety: - Individual verbal instruction and demonstration of equipment use and safety with use of the equipment. Flowsheet Row Cardiac Rehab from 07/06/2021 in [facility] Cardiac and Pulmonary Rehab  Date 07/04/21  Educator [facility]  Instruction Review Code 1- Verbalizes Understanding       Education: Exercise Physiology & General Exercise  Guidelines: - Group verbal and written instruction with models to review the exercise physiology of the cardiovascular system and associated critical values. Provides general exercise guidelines with specific guidelines to those with heart or lung disease.  Flowsheet Row Cardiac Rehab from 07/06/2021 in [facility] Cardiac and Pulmonary Rehab  Education need identified 07/04/21       Education: Flexibility, Balance, Mind/Body Relaxation: - Group verbal and visual presentation with interactive activity on the components of exercise prescription. Introduces F.I.T.T principle from ACSM for exercise prescriptions. Reviews F.I.T.T. principles of flexibility and balance exercise training including progression. Also discusses the mind body connection.  Reviews various relaxation techniques to help reduce and manage stress (i.e. Deep breathing, progressive muscle relaxation, and visualization). Balance handout provided to take home. Written material given at graduation.   Activity Barriers & Risk Stratification:   6 Minute Walk:  6 Minute Walk     Row Name 07/04/21 1658         6 Minute Walk   Phase Initial     Distance 1415 feet     Walk Time 6 minutes     # of Rest Breaks 0     MPH 2.68     METS 3.72     RPE 11     VO2 Peak 13.02     Symptoms No     Resting HR 68 bpm     Resting BP 138/80     Resting Oxygen Saturation  96 %     Exercise Oxygen Saturation  during 6 min walk 96 %     Max Ex. HR 80 bpm     Max Ex. BP 140/70     2 Minute Post BP 130/78              Oxygen Initial Assessment:   Oxygen Re-Evaluation:   Oxygen Discharge (Final Oxygen Re-Evaluation):   Initial Exercise Prescription:  Initial Exercise Prescription - 07/04/21 1600       Date of Initial Exercise RX and Referring Provider   Date 07/04/21    Referring Provider callwood      Oxygen   Maintain Oxygen Saturation 88% or higher      Treadmill   MPH 2.5    Grade 0    Minutes 15    METs 2.91       NuStep   Level 3    SPM 80    Minutes 15    METs 3.72      Elliptical   Level 1    Speed 3    Minutes 15      Biostep-RELP   Level 2    SPM 50    Minutes 15    METs 3.72      Prescription Details   Frequency (times per week) 2    Duration Progress to 30 minutes of continuous aerobic without signs/symptoms of physical distress      Intensity   THRR 40-80% of Max Heartrate 105-143    Ratings of Perceived Exertion 11-13    Perceived Dyspnea 0-4  Progression   Progression Continue to progress workloads to maintain intensity without signs/symptoms of physical distress.      Resistance Training   Training Prescription Yes    Weight 4    Reps 10-15             Perform Capillary Blood Glucose checks as needed.  Exercise Prescription Changes:   Exercise Prescription Changes     Row Name 07/04/21 1700 07/11/21 1400           Response to Exercise   Blood Pressure (Admit) 138/80 126/84      Blood Pressure (Exercise) 140/70 132/82      Blood Pressure (Exit) 130/78 140/82      Heart Rate (Admit) 68 bpm 63 bpm      Heart Rate (Exercise) 80 bpm 84 bpm      Heart Rate (Exit) 69 bpm 64 bpm      Oxygen Saturation (Admit) 96 % --      Oxygen Saturation (Exercise) 96 % --      Rating of Perceived Exertion (Exercise) 11 13      Symptoms none none      Comments walk test results third day      Duration -- Progress to 30 minutes of  aerobic without signs/symptoms of physical distress      Intensity -- THRR unchanged             Progression   Progression -- Continue to progress workloads to maintain intensity without signs/symptoms of physical distress.      Average METs -- 2.55             Resistance Training   Training Prescription -- Yes      Weight -- 5 lb      Reps -- 10-15             Interval Training   Interval Training -- No             Treadmill   MPH -- 2.6      Grade -- 0      Minutes -- 15      METs -- 2.99             NuStep    Level -- 3      Minutes -- 15      METs -- 2.1               Exercise Comments:   Exercise Comments     Row Name 07/06/21 3785           Exercise Comments First full day of exercise!  Patient was oriented to gym and equipment including functions, settings, policies, and procedures.  Patient's individual exercise prescription and treatment plan were reviewed.  All starting workloads were established based on the results of the 6 minute walk test done at initial orientation visit.  The plan for exercise progression was also introduced and progression will be customized based on patient's performance and goals.                Exercise Goals and Review:   Exercise Goals     Row Name 07/04/21 1704             Exercise Goals   Increase Physical Activity Yes       Intervention Provide advice, education, support and counseling about physical activity/exercise needs.;Develop an individualized exercise prescription for aerobic and resistive training based on initial evaluation findings, risk stratification, comorbidities  and participant's personal goals.       Expected Outcomes Short Term: Attend rehab on a regular basis to increase amount of physical activity.;Long Term: Add in home exercise to make exercise part of routine and to increase amount of physical activity.;Long Term: Exercising regularly at least 3-5 days a week.       Increase Strength and Stamina Yes       Intervention Provide advice, education, support and counseling about physical activity/exercise needs.;Develop an individualized exercise prescription for aerobic and resistive training based on initial evaluation findings, risk stratification, comorbidities and participant's personal goals.       Expected Outcomes Short Term: Increase workloads from initial exercise prescription for resistance, speed, and METs.;Short Term: Perform resistance training exercises routinely during rehab and add in resistance training at  home;Long Term: Improve cardiorespiratory fitness, muscular endurance and strength as measured by increased METs and functional capacity (6MWT)       Able to understand and use rate of perceived exertion (RPE) scale Yes       Intervention Provide education and explanation on how to use RPE scale       Expected Outcomes Short Term: Able to use RPE daily in rehab to express subjective intensity level;Long Term:  Able to use RPE to guide intensity level when exercising independently       Able to understand and use Dyspnea scale Yes       Intervention Provide education and explanation on how to use Dyspnea scale       Expected Outcomes Short Term: Able to use Dyspnea scale daily in rehab to express subjective sense of shortness of breath during exertion;Long Term: Able to use Dyspnea scale to guide intensity level when exercising independently       Knowledge and understanding of Target Heart Rate Range (THRR) Yes       Intervention Provide education and explanation of THRR including how the numbers were predicted and where they are located for reference       Expected Outcomes Short Term: Able to state/look up THRR;Long Term: Able to use THRR to govern intensity when exercising independently;Short Term: Able to use daily as guideline for intensity in rehab       Able to check pulse independently Yes       Intervention Provide education and demonstration on how to check pulse in carotid and radial arteries.;Review the importance of being able to check your own pulse for safety during independent exercise       Expected Outcomes Short Term: Able to explain why pulse checking is important during independent exercise;Long Term: Able to check pulse independently and accurately       Understanding of Exercise Prescription Yes       Intervention Provide education, explanation, and written materials on patient's individual exercise prescription       Expected Outcomes Short Term: Able to explain program  exercise prescription;Long Term: Able to explain home exercise prescription to exercise independently                Exercise Goals Re-Evaluation :  Exercise Goals Re-Evaluation     [place] Name 07/06/21 0938 07/11/21 1430           Exercise Goal Re-Evaluation   Exercise Goals Review Increase Physical Activity;Able to understand and use rate of perceived exertion (RPE) scale;Knowledge and understanding of Target Heart Rate Range (THRR);Understanding of Exercise Prescription;Able to understand and use Dyspnea scale;Increase Strength and Stamina;Able to check pulse independently Increase Physical  Activity;Increase Strength and Stamina      Comments Reviewed RPE and dyspnea scales, THR and program prescription with pt today.  Pt voiced understanding and was given a copy of goals to take home. Sherren Mocha has tolerated exercise well in his first sessions.  He has moved up to 5 lb for strength work.  Staff willmonitor progress.      Expected Outcomes Short: Use RPE daily to regulate intensity. Long: Follow program prescription in THR. Short: attend consistently Long: improve overall stamina               Discharge Exercise Prescription (Final Exercise Prescription Changes):  Exercise Prescription Changes - 07/11/21 1400       Response to Exercise   Blood Pressure (Admit) 126/84    Blood Pressure (Exercise) 132/82    Blood Pressure (Exit) 140/82    Heart Rate (Admit) 63 bpm    Heart Rate (Exercise) 84 bpm    Heart Rate (Exit) 64 bpm    Rating of Perceived Exertion (Exercise) 13    Symptoms none    Comments third day    Duration Progress to 30 minutes of  aerobic without signs/symptoms of physical distress    Intensity THRR unchanged      Progression   Progression Continue to progress workloads to maintain intensity without signs/symptoms of physical distress.    Average METs 2.55      Resistance Training   Training Prescription Yes    Weight 5 lb    Reps 10-15      Interval  Training   Interval Training No      Treadmill   MPH 2.6    Grade 0    Minutes 15    METs 2.99      NuStep   Level 3    Minutes 15    METs 2.1             Nutrition:  Target Goals: Understanding of nutrition guidelines, daily intake of sodium 1500mg , cholesterol 200mg , calories 30% from fat and 7% or less from saturated fats, daily to have 5 or more servings of fruits and vegetables.  Education: All About Nutrition: -Group instruction provided by verbal, written material, interactive activities, discussions, models, and posters to present general guidelines for heart healthy nutrition including fat, fiber, MyPlate, the role of sodium in heart healthy nutrition, utilization of the nutrition label, and utilization of this knowledge for meal planning. Follow up email sent as well. Written material given at graduation. Flowsheet Row Cardiac Rehab from 07/06/2021 in [facility] Cardiac and Pulmonary Rehab  Education need identified 07/04/21       Biometrics:  Pre Biometrics - 07/04/21 1704       Pre Biometrics   Height 6' 2.25" (1.886 m)    Weight 215 lb 9.6 oz (97.8 kg)    BMI (Calculated) 27.49              Nutrition Therapy Plan and Nutrition Goals:  Nutrition Therapy & Goals - 07/04/21 1705       Intervention Plan   Intervention Prescribe, educate and counsel regarding individualized specific dietary modifications aiming towards targeted core components such as weight, hypertension, lipid management, diabetes, heart failure and other comorbidities.    Expected Outcomes Short Term Goal: Understand basic principles of dietary content, such as calories, fat, sodium, cholesterol and nutrients.;Long Term Goal: Adherence to prescribed nutrition plan.;Short Term Goal: A plan has been developed with personal nutrition goals set during dietitian appointment.  Nutrition Assessments:  MEDIFICTS Score Key: ?70 Need to make dietary changes  40-70 Heart Healthy  Diet ? 40 Therapeutic Level Cholesterol Diet  Flowsheet Row Cardiac Rehab from 07/04/2021 in [facility] Cardiac and Pulmonary Rehab  Picture Your Plate Total Score on Admission 39      Picture Your Plate Scores: <70 Unhealthy dietary pattern with much room for improvement. 41-50 Dietary pattern unlikely to meet recommendations for good health and room for improvement. 51-60 More healthful dietary pattern, with some room for improvement.  >60 Healthy dietary pattern, although there may be some specific behaviors that could be improved.    Nutrition Goals Re-Evaluation:   Nutrition Goals Discharge (Final Nutrition Goals Re-Evaluation):   Psychosocial: Target Goals: Acknowledge presence or absence of significant depression and/or stress, maximize coping skills, provide positive support system. Participant is able to verbalize types and ability to use techniques and skills needed for reducing stress and depression.   Education: Stress, Anxiety, and Depression - Group verbal and visual presentation to define topics covered.  Reviews how body is impacted by stress, anxiety, and depression.  Also discusses healthy ways to reduce stress and to treat/manage anxiety and depression.  Written material given at graduation. Flowsheet Row Cardiac Rehab from 07/06/2021 in [facility] Cardiac and Pulmonary Rehab  Date 07/06/21  Educator [facility]  Instruction Review Code 1- Verbalizes Understanding       Education: Sleep Hygiene -Provides group verbal and written instruction about how sleep can affect your health.  Define sleep hygiene, discuss sleep cycles and impact of sleep habits. Review good sleep hygiene tips.    Initial Review & Psychosocial Screening:  Initial Psych Review & Screening - 06/27/21 1015       Initial Review   Current issues with Current Anxiety/Panic      Family Dynamics   Good Support System? Yes    Comments Sherren Mocha can look to his wife Pamala Hurry for support. He can talk to them over the  phone if he needs support since they live out of town.      Barriers   Psychosocial barriers to participate in program There are no identifiable barriers or psychosocial needs.;The patient should benefit from training in stress management and relaxation.      Screening Interventions   Interventions Encouraged to exercise;To provide support and resources with identified psychosocial needs;Provide feedback about the scores to participant    Expected Outcomes Short Term goal: Utilizing psychosocial counselor, staff and physician to assist with identification of specific Stressors or current issues interfering with healing process. Setting desired goal for each stressor or current issue identified.;Short Term goal: Identification and review with participant of any Quality of Life or Depression concerns found by scoring the questionnaire.;Long Term Goal: Stressors or current issues are controlled or eliminated.;Long Term goal: The participant improves quality of Life and PHQ9 Scores as seen by post scores and/or verbalization of changes             Quality of Life Scores:   Quality of Life - 07/04/21 1708       Quality of Life   Select Quality of Life      Quality of Life Scores   Health/Function Pre 25.93 %    Socioeconomic Pre 24.25 %    Psych/Spiritual Pre 25.5 %    Family Pre 28.5 %    GLOBAL Pre 25.83 %            Scores of 19 and below usually indicate a poorer quality of life  in these areas.  A difference of  2-3 points is a clinically meaningful difference.  A difference of 2-3 points in the total score of the Quality of Life Index has been associated with significant improvement in overall quality of life, self-image, physical symptoms, and general health in studies assessing change in quality of life.  PHQ-9: Recent Review Flowsheet Data     Depression screen [facility] 2/9 07/04/2021 03/14/2021 02/06/2019   Decreased Interest 1 0 0   Down, Depressed, Hopeless 2 0 0   PHQ - 2  Score 3 0 0   Altered sleeping 0 - -   Tired, decreased energy 1 - -   Change in appetite 1 - -   Feeling bad or failure about yourself  0 - -   Trouble concentrating 0 - -   Moving slowly or fidgety/restless 0 - -   Suicidal thoughts 0 - -   PHQ-9 Score 5 - -   Difficult doing work/chores Not difficult at all - -      Interpretation of Total Score  Total Score Depression Severity:  1-4 = Minimal depression, 5-9 = Mild depression, 10-14 = Moderate depression, 15-19 = Moderately severe depression, 20-27 = Severe depression   Psychosocial Evaluation and Intervention:  Psychosocial Evaluation - 06/27/21 1019       Psychosocial Evaluation & Interventions   Interventions Encouraged to exercise with the program and follow exercise prescription;Stress management education;Relaxation education    Comments Sherren Mocha can look to his wife Pamala Hurry for support. He can talk to them over the phone if he needs support since they live out of town.    Expected Outcomes Short: Start [facility] to help with mood. Long: Maintain a healthy mental state    Continue Psychosocial Services  Follow up required by staff             Psychosocial Re-Evaluation:   Psychosocial Discharge (Final Psychosocial Re-Evaluation):   Vocational Rehabilitation: Provide vocational rehab assistance to qualifying candidates.   Vocational Rehab Evaluation & Intervention:   Education: Education Goals: Education classes will be provided on a variety of topics geared toward better understanding of heart health and risk factor modification. Participant will state understanding/return demonstration of topics presented as noted by education test scores.  Learning Barriers/Preferences:  Learning Barriers/Preferences - 06/27/21 1015       Learning Barriers/Preferences   Learning Barriers None    Learning Preferences None             General Cardiac Education Topics:  AED/CPR: - Group verbal and written  instruction with the use of models to demonstrate the basic use of the AED with the basic ABC's of resuscitation.   Anatomy and Cardiac Procedures: - Group verbal and visual presentation and models provide information about basic cardiac anatomy and function. Reviews the testing methods done to diagnose heart disease and the outcomes of the test results. Describes the treatment choices: Medical Management, Angioplasty, or Coronary Bypass Surgery for treating various heart conditions including Myocardial Infarction, Angina, Valve Disease, and Cardiac Arrhythmias.  Written material given at graduation. Flowsheet Row Cardiac Rehab from 07/06/2021 in [facility] Cardiac and Pulmonary Rehab  Education need identified 07/04/21       Medication Safety: - Group verbal and visual instruction to review commonly prescribed medications for heart and lung disease. Reviews the medication, class of the drug, and side effects. Includes the steps to properly store meds and maintain the prescription regimen.  Written material given at graduation.  Intimacy: - Group verbal instruction through game format to discuss how heart and lung disease can affect sexual intimacy. Written material given at graduation..   Know Your Numbers and Heart Failure: - Group verbal and visual instruction to discuss disease risk factors for cardiac and pulmonary disease and treatment options.  Reviews associated critical values for Overweight/Obesity, Hypertension, Cholesterol, and Diabetes.  Discusses basics of heart failure: signs/symptoms and treatments.  Introduces Heart Failure Zone chart for action plan for heart failure.  Written material given at graduation.   Infection Prevention: - Provides verbal and written material to individual with discussion of infection control including proper hand washing and proper equipment cleaning during exercise session. Flowsheet Row Cardiac Rehab from 07/06/2021 in [facility] Cardiac and Pulmonary  Rehab  Date 07/04/21  Educator [facility]  Instruction Review Code 1- Verbalizes Understanding       Falls Prevention: - Provides verbal and written material to individual with discussion of falls prevention and safety. Flowsheet Row Cardiac Rehab from 07/06/2021 in [facility] Cardiac and Pulmonary Rehab  Date 07/04/21  Educator [facility]  Instruction Review Code 1- Verbalizes Understanding       Other: -Provides group and verbal instruction on various topics (see comments)   Knowledge Questionnaire Score:  Knowledge Questionnaire Score - 07/04/21 1708       Knowledge Questionnaire Score   Pre Score 23/26             Core Components/Risk Factors/Patient Goals at Admission:  Personal Goals and Risk Factors at Admission - 07/04/21 1705       Core Components/Risk Factors/Patient Goals on Admission    Weight Management Yes;Weight Loss    Intervention Weight Management: Develop a combined nutrition and exercise program designed to reach desired caloric intake, while maintaining appropriate intake of nutrient and fiber, sodium and fats, and appropriate energy expenditure required for the weight goal.;Weight Management: Provide education and appropriate resources to help participant work on and attain dietary goals.;Weight Management/Obesity: Establish reasonable short term and long term weight goals.    Admit Weight 215 lb 9.6 oz (97.8 kg)    Goal Weight: Short Term 210 lb (95.3 kg)    Goal Weight: Long Term 205 lb (93 kg)    Expected Outcomes Short Term: Continue to assess and modify interventions until short term weight is achieved;Long Term: Adherence to nutrition and physical activity/exercise program aimed toward attainment of established weight goal;Weight Maintenance: Understanding of the daily nutrition guidelines, which includes 25-35% calories from fat, 7% or less cal from saturated fats, less than 200mg  cholesterol, less than 1.5gm of sodium, & 5 or more servings of fruits and vegetables  daily;Weight Loss: Understanding of general recommendations for a balanced deficit meal plan, which promotes 1-2 lb weight loss per week and includes a negative energy balance of [id number] kcal/d;Understanding recommendations for meals to include 15-35% energy as protein, 25-35% energy from fat, 35-60% energy from carbohydrates, less than 200mg  of dietary cholesterol, 20-35 gm of total fiber daily;Understanding of distribution of calorie intake throughout the day with the consumption of 4-5 meals/snacks    Hypertension Yes    Intervention Provide education on lifestyle modifcations including regular physical activity/exercise, weight management, moderate sodium restriction and increased consumption of fresh fruit, vegetables, and low fat dairy, alcohol moderation, and smoking cessation.;Monitor prescription use compliance.    Expected Outcomes Short Term: Continued assessment and intervention until BP is < 140/69mm HG in hypertensive participants. < 130/14mm HG in hypertensive participants with diabetes, heart failure or chronic kidney disease.  Lipids Yes    Intervention Provide education and support for participant on nutrition & aerobic/resistive exercise along with prescribed medications to achieve LDL 70mg , HDL >40mg .    Expected Outcomes Short Term: Participant states understanding of desired cholesterol values and is compliant with medications prescribed. Participant is following exercise prescription and nutrition guidelines.;Long Term: Cholesterol controlled with medications as prescribed, with individualized exercise RX and with personalized nutrition plan. Value goals: LDL < 70mg , HDL > 40 mg.             Education:Diabetes - Individual verbal and written instruction to review signs/symptoms of diabetes, desired ranges of glucose level fasting, after meals and with exercise. Acknowledge that pre and post exercise glucose checks will be done for 3 sessions at entry of program.   Core  Components/Risk Factors/Patient Goals Review:    Core Components/Risk Factors/Patient Goals at Discharge (Final Review):    ITP Comments:  ITP Comments     Row Name 06/27/21 1013 07/04/21 1657 07/06/21 0937       ITP Comments Virtual Visit completed. Patient informed on EP and RD appointment and 6 Minute walk test. Patient also informed of patient health questionnaires on My Chart. Patient Verbalizes understanding. Visit diagnosis can be found in [facility] 06/02/2021. Completed 6MWT and gym orientation. Initial ITP created and sent for review to Dr. Emily Filbert, Medical Director. First full day of exercise!  Patient was oriented to gym and equipment including functions, settings, policies, and procedures.  Patient's individual exercise prescription and treatment plan were reviewed.  All starting workloads were established based on the results of the 6 minute walk test done at initial orientation visit.  The plan for exercise progression was also introduced and progression will be customized based on patient's performance and goals.              Comments: Discharge ITP

## 2021-07-12 NOTE — Progress Notes (Signed)
Discharge Progress Report  Patient Details  Name: Alex [name] MRN: [id number] Date of Birth: May 15, 1961 Referring Provider:   Flowsheet Row Cardiac Rehab from 07/04/2021 in [facility]  Referring Provider callwood        Number of Visits: 5  Reason for Discharge:  Early Exit:  Back to work  Smoking History:  Social History   Tobacco Use  Smoking Status Never  Smokeless Tobacco Former   Types: Chew   Quit date: 11/24/2015  Tobacco Comments   rare cigarillo     Diagnosis:  ST elevation myocardial infarction (STEMI), unspecified artery ([place])  Status post coronary artery stent placement  ADL [facility]SD:   Initial Exercise Prescription:  Initial Exercise Prescription - 07/04/21 1600       Date of Initial Exercise RX and Referring Provider   Date 07/04/21    Referring Provider callwood      Oxygen   Maintain Oxygen Saturation 88% or higher      Treadmill   MPH 2.5    Grade 0    Minutes 15    METs 2.91      NuStep   Level 3    SPM 80    Minutes 15    METs 3.72      Elliptical   Level 1    Speed 3    Minutes 15      Biostep-RELP   Level 2    SPM 50    Minutes 15    METs 3.72      Prescription Details   Frequency (times per week) 2    Duration Progress to 30 minutes of continuous aerobic without signs/symptoms of physical distress      Intensity   THRR 40-80% of Max Heartrate 105-143    Ratings of Perceived Exertion 11-13    Perceived Dyspnea 0-4      Progression   Progression Continue to progress workloads to maintain intensity without signs/symptoms of physical distress.      Resistance Training   Training Prescription Yes    Weight 4    Reps 10-15             Discharge Exercise Prescription (Final Exercise Prescription Changes):  Exercise Prescription Changes - 07/11/21 1400       Response to Exercise   Blood Pressure (Admit) 126/84    Blood Pressure (Exercise) 132/82    Blood Pressure (Exit) 140/82     Heart Rate (Admit) 63 bpm    Heart Rate (Exercise) 84 bpm    Heart Rate (Exit) 64 bpm    Rating of Perceived Exertion (Exercise) 13    Symptoms none    Comments third day    Duration Progress to 30 minutes of  aerobic without signs/symptoms of physical distress    Intensity THRR unchanged      Progression   Progression Continue to progress workloads to maintain intensity without signs/symptoms of physical distress.    Average METs 2.55      Resistance Training   Training Prescription Yes    Weight 5 lb    Reps 10-15      Interval Training   Interval Training No      Treadmill   MPH 2.6    Grade 0    Minutes 15    METs 2.99      NuStep   Level 3    Minutes 15    METs 2.1  Functional Capacity:  6 Minute Walk     Row Name 07/04/21 1658         6 Minute Walk   Phase Initial     Distance 1415 feet     Walk Time 6 minutes     # of Rest Breaks 0     MPH 2.68     METS 3.72     RPE 11     VO2 Peak 13.02     Symptoms No     Resting HR 68 bpm     Resting BP 138/80     Resting Oxygen Saturation  96 %     Exercise Oxygen Saturation  during 6 min walk 96 %     Max Ex. HR 80 bpm     Max Ex. BP 140/70     2 Minute Post BP 130/78              Psychological, QOL, Others - Outcomes: PHQ 2/9: Depression screen [facility] 2/9 07/04/2021 03/14/2021 02/06/2019  Decreased Interest 1 0 0  Down, Depressed, Hopeless 2 0 0  PHQ - 2 Score 3 0 0  Altered sleeping 0 - -  Tired, decreased energy 1 - -  Change in appetite 1 - -  Feeling bad or failure about yourself  0 - -  Trouble concentrating 0 - -  Moving slowly or fidgety/restless 0 - -  Suicidal thoughts 0 - -  PHQ-9 Score 5 - -  Difficult doing work/chores Not difficult at all - -     Nutrition & Weight - Outcomes:  Pre Biometrics - 07/04/21 1704       Pre Biometrics   Height 6' 2.25" (1.886 m)    Weight 215 lb 9.6 oz (97.8 kg)    BMI (Calculated) 27.49              Nutrition:  Nutrition  Therapy & Goals - 07/04/21 1705       Intervention Plan   Intervention Prescribe, educate and counsel regarding individualized specific dietary modifications aiming towards targeted core components such as weight, hypertension, lipid management, diabetes, heart failure and other comorbidities.    Expected Outcomes Short Term Goal: Understand basic principles of dietary content, such as calories, fat, sodium, cholesterol and nutrients.;Long Term Goal: Adherence to prescribed nutrition plan.;Short Term Goal: A plan has been developed with personal nutrition goals set during dietitian appointment.              Goals reviewed with patient; copy given to patient.

## 2021-07-12 NOTE — Telephone Encounter (Signed)
Patient called, states he was cleared to start work again and was not able to commit to any of our other times/day for cardiac rehab exercise sessions. We also offered to have patient participate in the hybrid program but patient declined and stated he wanted to be discharged at this time.

## 2021-09-01 ENCOUNTER — Encounter: Payer: Self-pay | Admitting: Internal Medicine

## 2021-09-12 ENCOUNTER — Ambulatory Visit: Payer: BC Managed Care – PPO | Admitting: Internal Medicine

## 2021-09-12 ENCOUNTER — Encounter: Payer: Self-pay | Admitting: Internal Medicine

## 2021-09-12 ENCOUNTER — Other Ambulatory Visit: Payer: Self-pay

## 2021-09-12 DIAGNOSIS — I25119 Atherosclerotic heart disease of native coronary artery with unspecified angina pectoris: Secondary | ICD-10-CM | POA: Diagnosis not present

## 2021-09-12 DIAGNOSIS — I10 Essential (primary) hypertension: Secondary | ICD-10-CM | POA: Diagnosis not present

## 2021-09-12 DIAGNOSIS — G4733 Obstructive sleep apnea (adult) (pediatric): Secondary | ICD-10-CM | POA: Diagnosis not present

## 2021-09-12 NOTE — Assessment & Plan Note (Signed)
Sleeps with the CPAP every night

## 2021-09-12 NOTE — Progress Notes (Signed)
Subjective:    Patient ID: Alex [name], male    DOB: 12-29-1960, 60 y.o.   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 30 tablet 0   atorvastatin (LIPITOR) 80 MG tablet Take 1 tablet (80 mg total) by mouth daily. 30 tablet 0   bisoprolol-hydrochlorothiazide (ZIAC) 2.5-6.25 MG tablet Take 1 tablet by mouth daily.     DULoxetine (CYMBALTA) 30 MG capsule TAKE 1 CAPSULE BY MOUTH ONCE DAILY 90 capsule 3   famotidine (PEPCID) 40 MG tablet Take 1 tablet (40 mg total) by mouth 2 (two) times daily. 180 tablet 3   losartan (COZAAR) 50 MG tablet Take 1 tablet (50 mg total) by mouth daily. 30 tablet 0   metoprolol tartrate (LOPRESSOR) 50 MG tablet Take 1 tablet (50 mg total) by mouth 2 (two) times daily. 60 tablet 0   nitroGLYCERIN (NITROSTAT) 0.4 MG SL tablet Place 1 tablet (0.4 mg total) under the tongue every 5 (five) minutes as needed for chest pain. 30 tablet 0   ticagrelor (BRILINTA) 90 MG TABS tablet Take 1 tablet (90 mg total) by mouth 2 (two) times daily. 60 tablet 0   No current facility-administered medications on file prior to visit.    No Known Allergies  Past Medical History:  Diagnosis Date   HTN (hypertension)    OSA (obstructive sleep apnea)    PSG 09/23/10 AHI 22   RBBB (right bundle branch block)     Past Surgical History:  Procedure Laterality Date   APPENDECTOMY  1988   CORONARY/GRAFT ACUTE MI REVASCULARIZATION N/A 06/02/2021   Procedure: Coronary/Graft Acute MI Revascularization;  Surgeon: Yolonda Kida, MD;  Location: [place]  CV [facility];  Service: Cardiovascular;  Laterality: N/A;   fracture L arm     LEFT HEART CATH AND CORONARY ANGIOGRAPHY N/A 06/02/2021   Procedure: LEFT HEART CATH AND CORONARY ANGIOGRAPHY;  Surgeon: Yolonda Kida, MD;  Location: [place] [facility];  Service: Cardiovascular;  Laterality: N/A;   TONSILLECTOMY     VASECTOMY  1997     Family History  Problem Relation Age of Onset   Stroke Father 18       doing okay   Skin cancer Mother    Stroke Paternal Grandfather        and PGGF   Hypertension Other        dad's side    Diabetes Neg Hx        DM    Coronary artery disease Neg Hx    Prostate cancer Neg Hx        no colon cancer    Colon cancer Neg Hx     Social History   Socioeconomic History   Marital status: Married    Spouse name: Not on file   Number of children: 2   Years of education: Not on file   Highest education level: Not on file  Occupational History   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   [employer].        Social Determinants of Health   Financial Resource Strain: Not on file  Food Insecurity: Not on file  Transportation Needs: Not on file  Physical Activity: Not on file  Stress: Not on file  Social Connections: Not on file  Intimate Partner Violence: Not on file   Review of Systems Continues to eat healthy---mostly Weight is down another 5#    Objective:   Physical Exam Constitutional:      Appearance: Normal appearance.  Cardiovascular:     Rate  and Rhythm: Normal rate and regular rhythm.     Pulses: Normal pulses.     Heart sounds: No murmur heard.   No gallop.  Pulmonary:     Effort: Pulmonary effort is normal.     Breath sounds: Normal breath sounds. No wheezing or rales.  Musculoskeletal:     Cervical back: Neck supple.  Lymphadenopathy:     Cervical: No cervical adenopathy.  Neurological:     Mental Status: He is alert.           Assessment & Plan:

## 2021-09-12 NOTE — Assessment & Plan Note (Addendum)
Still has easy DOE He felt he was getting in better shape till the MI---but I guess this still could be some degree of deconditioning Discussed immediate action (911) if he has rest symptoms or increase in dyspnea He should work on fitness---and hopefully his tolerance will improve Continues on ASA and ticagrelor for now (??SOB due to ticagrelor)

## 2021-09-12 NOTE — Assessment & Plan Note (Addendum)
BP Readings from Last 3 Encounters:  09/12/21 140/90  06/09/21 132/90  06/04/21 (!) 151/102   Acceptable on losartan, metoprolol, bisoprolol HCTZ

## 2021-10-24 ENCOUNTER — Ambulatory Visit: Payer: BC Managed Care – PPO | Admitting: Internal Medicine

## 2021-10-31 ENCOUNTER — Other Ambulatory Visit: Payer: Self-pay

## 2021-10-31 ENCOUNTER — Ambulatory Visit: Payer: BC Managed Care – PPO | Admitting: Internal Medicine

## 2021-10-31 ENCOUNTER — Encounter: Payer: Self-pay | Admitting: Internal Medicine

## 2021-10-31 VITALS — BP 118/84 | HR 67 | Temp 97.4°F | Ht 71.25 in | Wt 216.0 lb

## 2021-10-31 DIAGNOSIS — I25119 Atherosclerotic heart disease of native coronary artery with unspecified angina pectoris: Secondary | ICD-10-CM

## 2021-10-31 DIAGNOSIS — Z0000 Encounter for general adult medical examination without abnormal findings: Secondary | ICD-10-CM | POA: Diagnosis not present

## 2021-10-31 DIAGNOSIS — F39 Unspecified mood [affective] disorder: Secondary | ICD-10-CM

## 2021-10-31 DIAGNOSIS — I10 Essential (primary) hypertension: Secondary | ICD-10-CM

## 2021-10-31 DIAGNOSIS — Z23 Encounter for immunization: Secondary | ICD-10-CM | POA: Diagnosis not present

## 2021-10-31 DIAGNOSIS — G4733 Obstructive sleep apnea (adult) (pediatric): Secondary | ICD-10-CM | POA: Diagnosis not present

## 2021-10-31 MED ORDER — DULOXETINE HCL 60 MG PO CPEP: 60.0000 mg | ORAL_CAPSULE | Freq: Every day | ORAL | 3 refills | Status: DC

## 2021-10-31 NOTE — Assessment & Plan Note (Signed)
Positive response to duloxetine has waned Will increase to 60mg  daily

## 2021-10-31 NOTE — Progress Notes (Signed)
Subjective:    Patient ID: Alex [name], male    DOB: 07-15-1961, 61 y.o.   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 30 tablet 0   atorvastatin (LIPITOR) 80 MG tablet Take 1 tablet (80 mg total) by mouth daily. 30 tablet 0   DULoxetine (CYMBALTA) 30 MG capsule TAKE 1 CAPSULE BY MOUTH ONCE DAILY 90 capsule 3   famotidine (PEPCID) 40 MG tablet Take 1 tablet (40 mg total) by mouth 2 (two) times daily. 180 tablet 3   losartan (COZAAR) 50 MG tablet Take 1 tablet (50 mg total) by mouth daily. 30 tablet 0   metoprolol tartrate (LOPRESSOR) 50 MG tablet Take 1 tablet (50 mg total) by mouth 2 (two) times daily. 60 tablet 0   nitroGLYCERIN (NITROSTAT) 0.4 MG SL tablet Place 1 tablet (0.4 mg total) under the tongue every 5 (five) minutes as needed for chest pain. 30 tablet 0   ticagrelor (BRILINTA) 90 MG TABS tablet Take 1 tablet (90 mg total) by mouth 2 (two) times daily. 60 tablet 0   No current facility-administered medications on file prior to visit.    No Known Allergies  Past Medical History:  Diagnosis Date   HTN (hypertension)    OSA (obstructive sleep apnea)    PSG 09/23/10 AHI 22   RBBB (right bundle branch block)     Past Surgical History:  Procedure Laterality Date   APPENDECTOMY  1988    CORONARY/GRAFT ACUTE MI REVASCULARIZATION N/A 06/02/2021   Procedure: Coronary/Graft Acute MI Revascularization;  Surgeon: Yolonda Kida, MD;  Location: [place] [facility];  Service: Cardiovascular;  Laterality: N/A;   fracture L arm     LEFT HEART CATH AND CORONARY ANGIOGRAPHY N/A 06/02/2021   Procedure: LEFT HEART CATH AND CORONARY ANGIOGRAPHY;  Surgeon: Yolonda Kida, MD;  Location: [place] [facility];  Service: Cardiovascular;  Laterality: N/A;   TONSILLECTOMY     VASECTOMY  1997     Family History  Problem Relation Age of Onset   Stroke Father 90       doing okay   Skin cancer Mother    Stroke Paternal Grandfather        and PGGF   Hypertension Other        dad's side    Diabetes Neg Hx        DM    Coronary artery disease Neg Hx    Prostate cancer Neg Hx        no colon cancer    Colon cancer Neg Hx     Social History   Socioeconomic History   Marital status: Married    Spouse name: Not on file   Number of children: 2   Years of education: Not on file   Highest education  level: Not on file  Occupational History   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   [employer].        Social Determinants of Health   Financial Resource Strain: Not on file  Food Insecurity: Not on file  Transportation Needs: Not on file  Physical Activity: Not on file  Stress: Not on file  Social Connections: Not on file  Intimate Partner Violence: Not on file   Review of Systems   Constitutional:  Negative for fatigue and unexpected weight change.       Wears seat belt  HENT:  Positive for tinnitus.        Mild hearing loss Keeps up with dentist--needed some pulled  Eyes:  Negative for visual disturbance.       No diplopia or unilateral vision loss  Respiratory:  Positive for shortness of breath. Negative for chest tightness.        Occ cough  Cardiovascular:  Positive for palpitations. Negative for chest pain and leg swelling.  Gastrointestinal:  Negative for blood in stool and constipation.       No heartburn  Endocrine: Negative for polydipsia and polyuria.  Genitourinary:        Stream slow at first--then fine No sexual problems  Musculoskeletal:  Negative for arthralgias and joint swelling.       Some chronic low back pain due to dead lifts  Skin:  Negative for rash.       Some moles on back (they are fleshy---reassured)  Allergic/Immunologic: Positive for environmental allergies. Negative for immunocompromised state.       No meds for this  Neurological:  Negative for dizziness, syncope, light-headedness and headaches.  Hematological:  Negative for adenopathy. Does not bruise/bleed easily.  Psychiatric/Behavioral:  Positive for sleep disturbance. Negative for dysphoric mood. The patient is nervous/anxious.       Objective:   Physical Exam Constitutional:      Appearance: Normal appearance.  HENT:     Mouth/Throat:     Pharynx: No oropharyngeal exudate or posterior oropharyngeal erythema.  Eyes:     Conjunctiva/sclera: Conjunctivae normal.     Pupils: Pupils are equal, round, and reactive to light.  Cardiovascular:     Rate and Rhythm: Normal rate and regular rhythm.     Pulses: Normal pulses.     Heart sounds: No murmur heard.   No gallop.  Pulmonary:     Effort: Pulmonary effort is normal.     Breath sounds: Normal breath sounds. No wheezing or rales.  Abdominal:     Palpations: Abdomen is soft.     Tenderness: There is no abdominal  tenderness.  Musculoskeletal:     Cervical back: Neck supple.     Right lower leg: No edema.     Left lower leg: No edema.  Lymphadenopathy:     Cervical: No cervical  adenopathy.  Skin:    Findings: No lesion or rash.     Comments: Numerous benign nevi  Neurological:     General: No focal deficit present.     Mental Status: He is alert and oriented to person, place, and time.  Psychiatric:        Mood and Affect: Mood normal.        Behavior: Behavior normal.           Assessment & Plan:

## 2021-10-31 NOTE — Assessment & Plan Note (Signed)
Healthy Colon due 2025 Defer PSA to next time---had come back to normal in June 2022 Recommended COVID vaccine--he will consider shingrix #2 today Flu vaccine in the fall

## 2021-10-31 NOTE — Assessment & Plan Note (Signed)
Sleeps okay with CPAP

## 2021-10-31 NOTE — Addendum Note (Signed)
Addended by: Pilar Grammes on: 10/31/2021 02:47 PM   Modules accepted: Orders

## 2021-10-31 NOTE — Assessment & Plan Note (Signed)
BP Readings from Last 3 Encounters:  10/31/21 118/84  09/12/21 140/90  06/09/21 132/90   Okay on metoprolol and losartan

## 2021-10-31 NOTE — Assessment & Plan Note (Signed)
Still with mild DOE---but has improved Brilinta vs deconditioning vs mild ischemia---but has improved ASA 81/brilinta/metoprolol 50 bid/losartan 50/atorvastatin 80

## 2021-12-18 ENCOUNTER — Other Ambulatory Visit: Payer: Self-pay | Admitting: Internal Medicine

## 2021-12-23 ENCOUNTER — Telehealth: Payer: Self-pay

## 2021-12-23 MED ORDER — DULOXETINE HCL 60 MG PO CPEP: 60.0000 mg | ORAL_CAPSULE | Freq: Every day | ORAL | 0 refills | Status: DC

## 2021-12-23 NOTE — Telephone Encounter (Signed)
Pt's wife called asking that we send in a small supply of pts cymbalta due to him forgetting his medications while in [place].  ?5 pills sent to [facility] and pt notified.  ?

## 2021-12-23 NOTE — Addendum Note (Signed)
Addended by: Pilar Grammes on: 12/23/2021 04:54 PM ? ? Modules accepted: Orders ? ?

## 2021-12-26 NOTE — Addendum Note (Signed)
Addended by: Pilar Grammes on: 12/26/2021 12:02 PM ? ? Modules accepted: Orders ? ?

## 2021-12-26 NOTE — Telephone Encounter (Signed)
Left message for pt's to apologize that the 3rd time we sent in the medication, it failed. The last time was after we closed for the day on Friday.  ?

## 2022-03-20 ENCOUNTER — Encounter: Payer: Self-pay | Admitting: Internal Medicine

## 2022-05-24 ENCOUNTER — Other Ambulatory Visit: Payer: Self-pay | Admitting: Internal Medicine

## 2022-10-19 ENCOUNTER — Other Ambulatory Visit: Payer: Self-pay | Admitting: Internal Medicine

## 2023-01-01 ENCOUNTER — Other Ambulatory Visit: Payer: Self-pay | Admitting: Internal Medicine

## 2023-01-01 NOTE — Telephone Encounter (Signed)
Do not see where patient has visit scheduled at this time. Last seen over a year ago.

## 2023-01-01 NOTE — Telephone Encounter (Signed)
Have him set up for a physical in the next 3 months

## 2023-01-02 NOTE — Telephone Encounter (Signed)
Patient scheduled.

## 2023-01-27 IMAGING — CT CT ANGIO CHEST-ABD-PELV FOR DISSECTION W/ AND WO/W CM
2 of 7 series · 14 of 46 positions shown, 16 images · IV contrast (APPLIED)
Comparison: None.

CLINICAL DATA: Centralized chest pain radiating to arm. Vomiting.
Difficulty speaking.

EXAM:
CT ANGIOGRAPHY CHEST, ABDOMEN AND PELVIS
TECHNIQUE: Non-contrast CT of the chest was initially obtained.

[Series 4: axial arterial · axial · arterial · 0.82mm/px · z∈[-896,-233]mm · 11 of 255 slices shown, 13 images]
[im 17/255  soft-tissue]
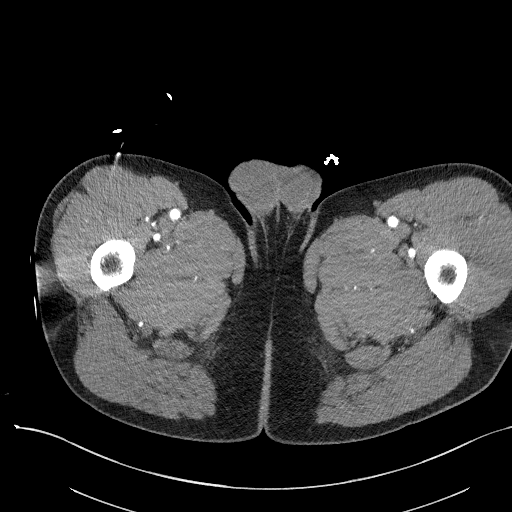
[im 17/255  bone]
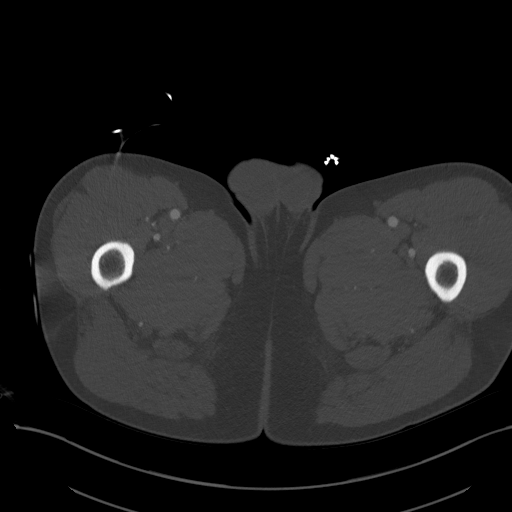
[im 34/255  soft-tissue]
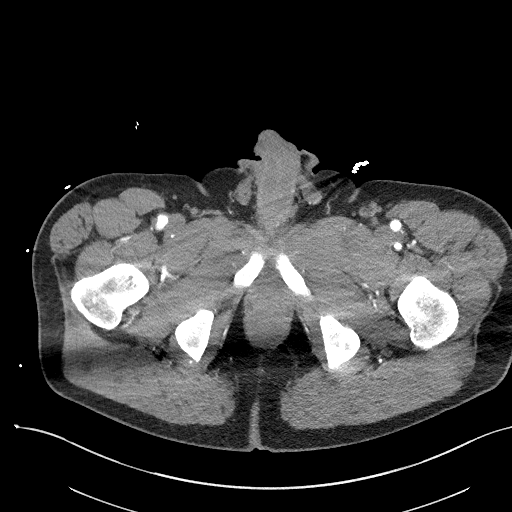
[im 68/255  soft-tissue]
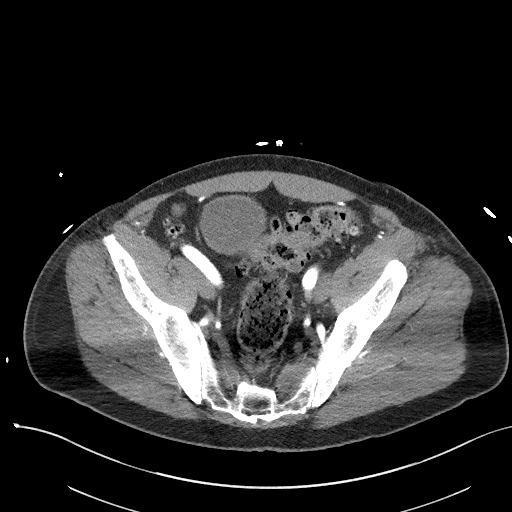
[im 85/255  soft-tissue]
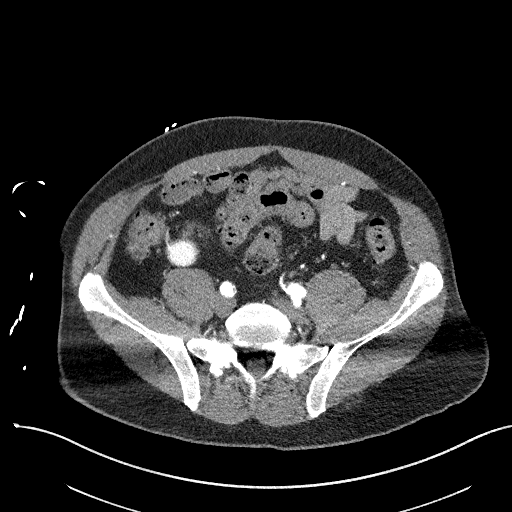
[im 102/255  soft-tissue]
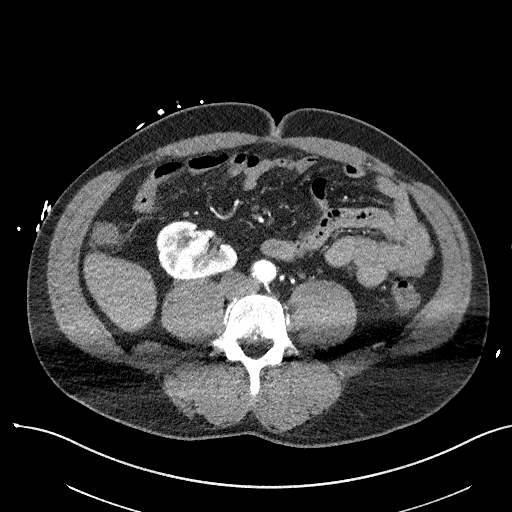
[im 136/255  soft-tissue]
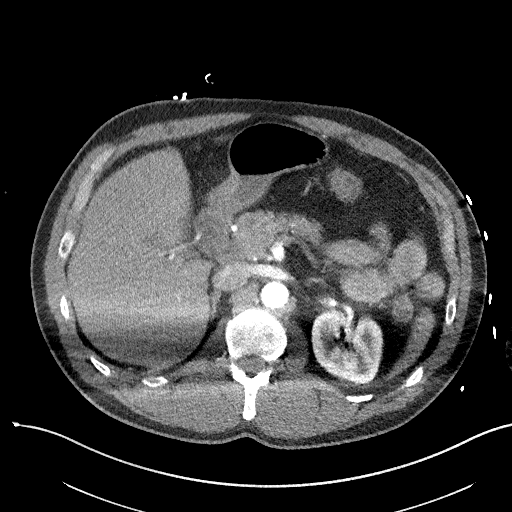
[im 153/255  soft-tissue]
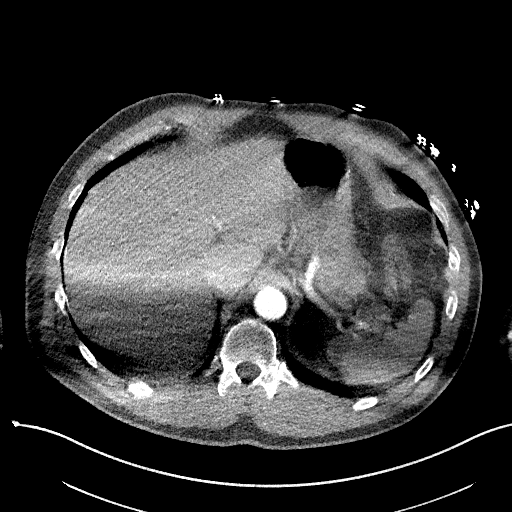
[im 170/255  soft-tissue]
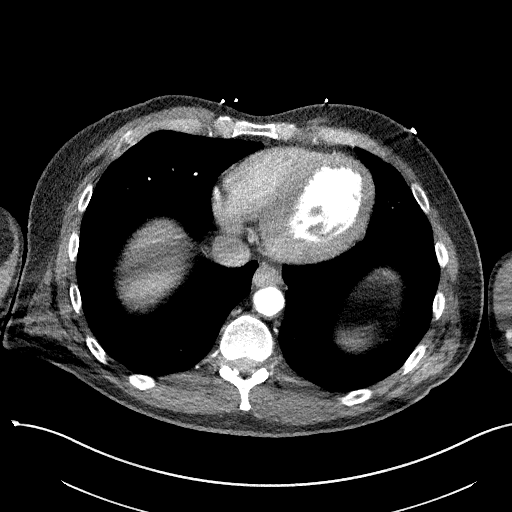
[im 187/255  soft-tissue]
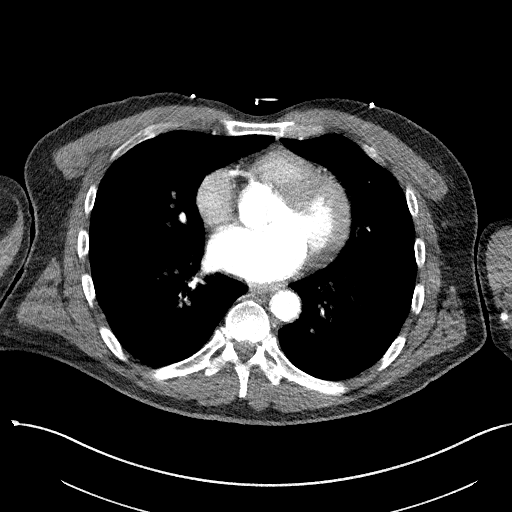
[im 187/255  bone]
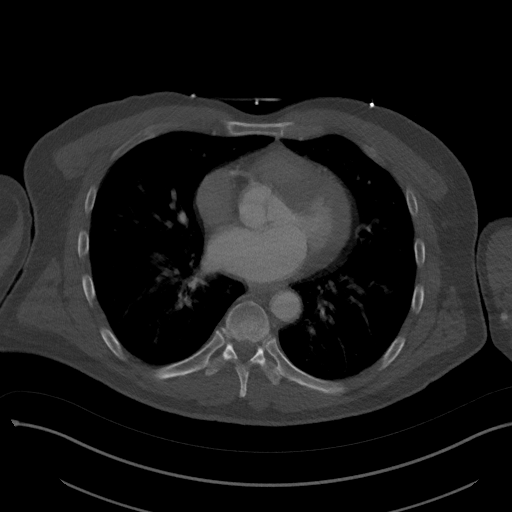
[im 221/255  soft-tissue]
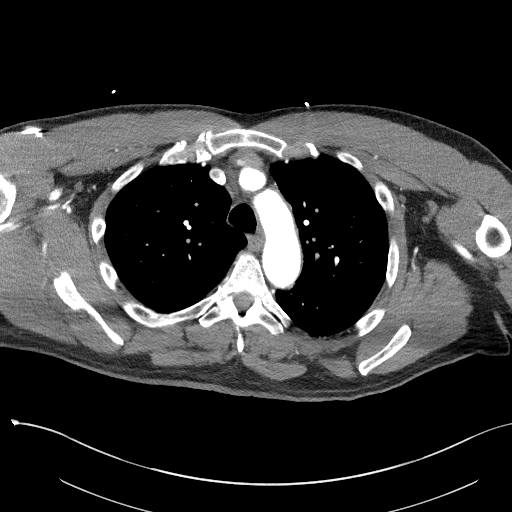
[im 238/255  soft-tissue]
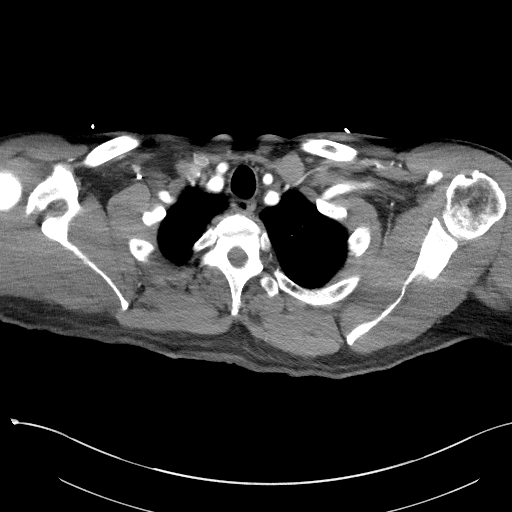

[Series 6: coronals · coronal · 1.00mm/px · 3 of 154 slices shown]
[im 39/154  soft-tissue]
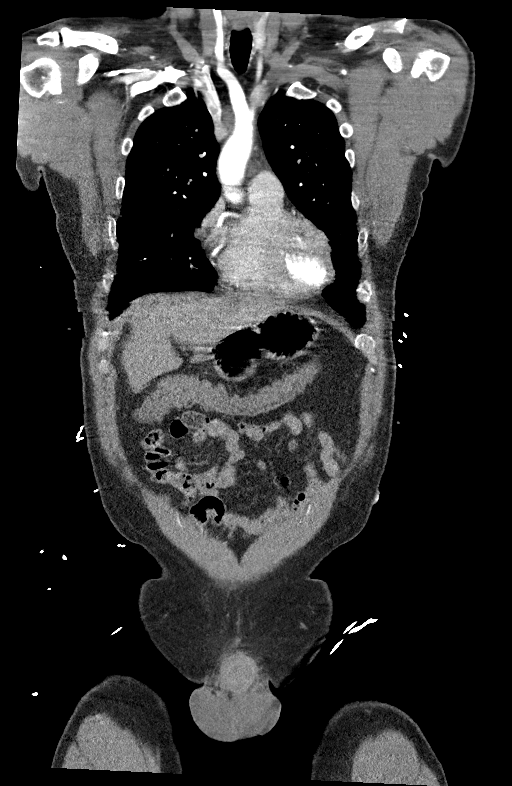
[im 77/154  soft-tissue]
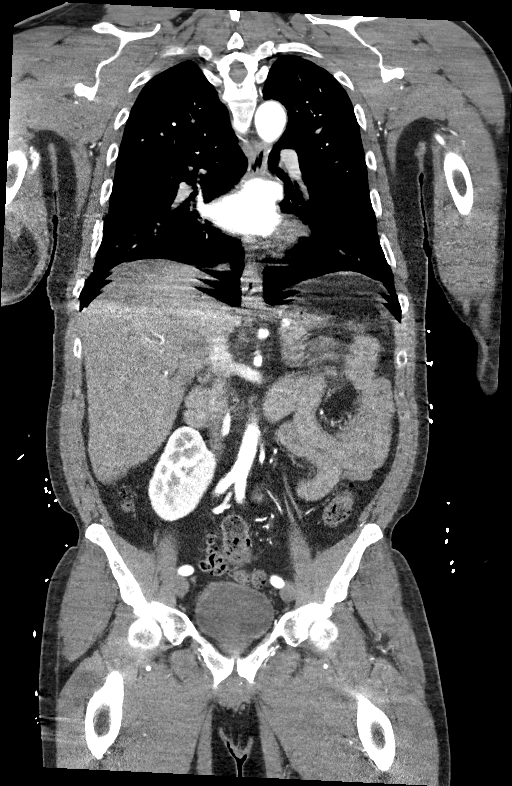
[im 115/154  soft-tissue]
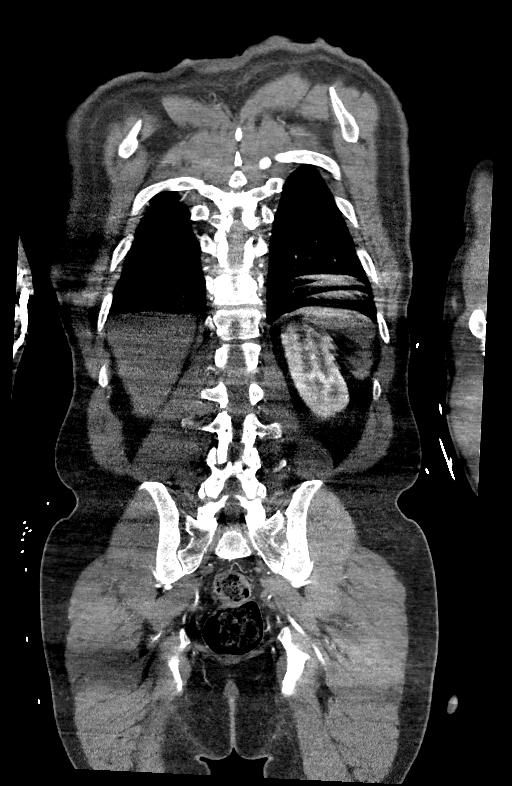

[14 of 46 positions shown; findings below may reference images not displayed]

Multidetector CT imaging through the chest, abdomen and pelvis was
performed using the standard protocol during bolus administration of
intravenous contrast. Multiplanar reconstructed images and MIPs were
obtained and reviewed to evaluate the vascular anatomy.

CONTRAST:  100mL OMNIPAQUE IOHEXOL 350 MG/ML SOLN
FINDINGS: CTA CHEST FINDINGS

Cardiovascular: No aortic intramural hematoma. No aortic dissection.
Heart size within normal limits.

Mediastinum/Nodes: No enlarged mediastinal, hilar, or axillary lymph
nodes. Thyroid gland, trachea, and esophagus demonstrate no
significant findings.

Lungs/Pleura: Evaluation of the lungs, particularly the lower lobes
to motion. No focal airspace opacity pneumonia. No pneumothorax or
pleural effusion.

Musculoskeletal: Evaluation limited by motion. No acute abnormality.

Review of the MIP images confirms the above findings.

CTA ABDOMEN AND PELVIS FINDINGS

VASCULAR

Aorta: Normal caliber aorta without aneurysm, dissection, vasculitis
or significant stenosis.

Celiac: Motion limits evaluation celiac artery. No significant
abnormality is suspected.

SMA: Evaluation limited to motion.  Abnormality is suspected.

Renals: 2 main right renal artery, 1 originating from the aorta and
other origin from left common iliac artery. There is moderate to
severe stenosis at the origin the right inferior pole artery
originating from the left common iliac. No significant abnormality
left main.

IMA: Patent without evidence of aneurysm, dissection, vasculitis or
significant stenosis.

Inflow: Patent without evidence of aneurysm, dissection, vasculitis
or significant stenosis.

Veins: No obvious venous abnormality within the limitations of this
arterial phase study.

Review of the MIP images confirms the above findings.

NON-VASCULAR

Hepatobiliary: Evaluation limited due to motion. No acute
abnormality.

Pancreas: No acute abnormality.

Spleen: Evaluation significantly limited due to motion. No acute
abnormality.

Adrenals/Urinary Tract: Adrenal glands are unremarkable. Right
kidney is malrotated and located within the pelvis. Otherwise no
significant abnormality of the kidneys. Bladder is unremarkable.

Stomach/Bowel: Colonic diverticulosis without evidence of acute
diverticulitis. The appendix is not identified.

Lymphatic: No enlarged abdominal or pelvic lymph nodes.

Reproductive: Enlarged nodular prostate impressing on the bladder
neck.

Other: No abdominal wall hernia or abnormality. No abdominopelvic
ascites.

Musculoskeletal: No acute or significant osseous findings.

Review of the MIP images confirms the above findings.
IMPRESSION: 1. No acute abnormality of the chest, abdomen, or pelvis. No acute
abnormality of the aorta.
2. Colonic diverticulosis.
3. Evaluation of the lower chest and upper abdomen significantly
limited due to motion.

## 2023-01-30 ENCOUNTER — Telehealth: Payer: Self-pay | Admitting: Internal Medicine

## 2023-01-30 NOTE — Telephone Encounter (Signed)
The duloxetine 60 mg was removed from his list for some reason. I have not been able to find where it happened. He says he has not stopped taking it. He is aware I have to wait on Dr Alphonsus Sias to approve in the morning when he returns.

## 2023-01-30 NOTE — Telephone Encounter (Signed)
This is not on his med list. Left message on VM for pt to call back with an update on the medication.

## 2023-01-30 NOTE — Telephone Encounter (Signed)
Prescription Request  01/30/2023  LOV: Visit date not found  What is the name of the medication or equipment? Duloxetine (Cymbalta) 60 MG   Have you contacted your pharmacy to request a refill? No   Which pharmacy would you like this sent to?  [facility] - [place], [place] - [address] [place] [address] Phone: [phone] Fax: [phone]   Patient notified that their request is being sent to the clinical staff for review and that they should receive a response within 2 business days.   Please advise at Mobile [phone] (mobile)

## 2023-01-31 MED ORDER — DULOXETINE HCL 60 MG PO CPEP: 60.0000 mg | ORAL_CAPSULE | Freq: Every day | ORAL | 3 refills | Status: DC

## 2023-01-31 NOTE — Telephone Encounter (Signed)
Let him know I sent the Rx 

## 2023-01-31 NOTE — Telephone Encounter (Signed)
Spoke to pt

## 2023-01-31 NOTE — Addendum Note (Signed)
Addended by: Tillman Abide I on: 01/31/2023 07:26 AM   Modules accepted: Orders

## 2023-02-28 ENCOUNTER — Telehealth: Payer: Self-pay | Admitting: Internal Medicine

## 2023-02-28 NOTE — Telephone Encounter (Signed)
Called and spoke to pt. Advised him that the last time we show a sildenafil rx was in 2021. He said it had been awhile. I advised him Dr Alphonsus Sias is out of the office. It may be the end of the week or Monday before it gets addressed as I am not sure Worthy Rancher, NP will want to start a new medication without seeing the pt.

## 2023-02-28 NOTE — Telephone Encounter (Signed)
Patient called in asking for a refill of Sildenafil Citrate. I informed him that this medication isn't on his list,but he said that he do take it.Please advise.

## 2023-03-02 MED ORDER — SILDENAFIL CITRATE 50 MG PO TABS: 50.0000 mg | ORAL_TABLET | Freq: Every day | ORAL | 1 refills | Status: DC | PRN

## 2023-03-02 NOTE — Telephone Encounter (Signed)
Spoke to pt. He has never had to take the nitro. I have removed it. I advised him that insurance may not cover this and he may need to get it at [facility] or [facility] with a good rx card.

## 2023-03-02 NOTE — Addendum Note (Signed)
Addended by: Eual Fines on: 03/02/2023 02:45 PM   Modules accepted: Orders

## 2023-03-05 ENCOUNTER — Telehealth: Payer: Self-pay

## 2023-03-05 NOTE — Telephone Encounter (Signed)
*  Primary  PA request received via CMM for Sildenafil Citrate 100MG  tablets  PA submitted to [employer] and is pending additional questions/determination  *DX of erectile dysfunction  Key: [id number]

## 2023-03-15 NOTE — Telephone Encounter (Signed)
Medication is DENIED for diagnosis.

## 2023-04-19 ENCOUNTER — Other Ambulatory Visit: Payer: Self-pay | Admitting: Internal Medicine

## 2023-04-23 ENCOUNTER — Ambulatory Visit (INDEPENDENT_AMBULATORY_CARE_PROVIDER_SITE_OTHER): Payer: BC Managed Care – PPO | Admitting: Internal Medicine

## 2023-04-23 ENCOUNTER — Encounter: Payer: Self-pay | Admitting: Internal Medicine

## 2023-04-23 VITALS — BP 118/78 | HR 66 | Temp 97.0°F | Ht 73.5 in | Wt 212.0 lb

## 2023-04-23 DIAGNOSIS — G4733 Obstructive sleep apnea (adult) (pediatric): Secondary | ICD-10-CM | POA: Diagnosis not present

## 2023-04-23 DIAGNOSIS — Z125 Encounter for screening for malignant neoplasm of prostate: Secondary | ICD-10-CM

## 2023-04-23 DIAGNOSIS — Z0000 Encounter for general adult medical examination without abnormal findings: Secondary | ICD-10-CM

## 2023-04-23 DIAGNOSIS — F39 Unspecified mood [affective] disorder: Secondary | ICD-10-CM

## 2023-04-23 DIAGNOSIS — I2510 Atherosclerotic heart disease of native coronary artery without angina pectoris: Secondary | ICD-10-CM

## 2023-04-23 DIAGNOSIS — I10 Essential (primary) hypertension: Secondary | ICD-10-CM

## 2023-04-23 LAB — LIPID PANEL
Cholesterol: 93 mg/dL (ref 0–200)
HDL: 40 mg/dL (ref >39.00)
LDL Cholesterol: 45 mg/dL (ref 0–99)
NonHDL: 52.93
Total CHOL/HDL Ratio: 2
Triglycerides: 38 mg/dL (ref 0.0–149.0)
VLDL: 7.6 mg/dL (ref 0.0–40.0)

## 2023-04-23 LAB — COMPREHENSIVE METABOLIC PANEL
ALT: 16 U/L (ref 0–53)
AST: 18 U/L (ref 0–37)
Albumin: 4.4 g/dL (ref 3.5–5.2)
Alkaline Phosphatase: 105 U/L (ref 39–117)
BUN: 25 mg/dL — ABNORMAL HIGH (ref 6–23)
CO2: 27 mEq/L (ref 19–32)
Calcium: 9.5 mg/dL (ref 8.4–10.5)
Chloride: 106 mEq/L (ref 96–112)
Creatinine, Ser: 1.4 mg/dL (ref 0.40–1.50)
GFR: 54.58 mL/min — ABNORMAL LOW (ref >60.00)
Glucose, Bld: 99 mg/dL (ref 70–99)
Potassium: 4.1 mEq/L (ref 3.5–5.1)
Sodium: 141 mEq/L (ref 135–145)
Total Bilirubin: 1.5 mg/dL — ABNORMAL HIGH (ref 0.2–1.2)
Total Protein: 6.4 g/dL (ref 6.0–8.3)

## 2023-04-23 LAB — CBC
HCT: 44.8 % (ref 39.0–52.0)
Hemoglobin: 14.4 g/dL (ref 13.0–17.0)
MCHC: 32.1 g/dL (ref 30.0–36.0)
MCV: 87.1 fl (ref 78.0–100.0)
Platelets: 218 10*3/uL (ref 150.0–400.0)
RBC: 5.15 Mil/uL (ref 4.22–5.81)
RDW: 13.5 % (ref 11.5–15.5)
WBC: 8.9 10*3/uL (ref 4.0–10.5)

## 2023-04-23 LAB — PSA: PSA: 5.56 ng/mL — ABNORMAL HIGH (ref 0.10–4.00)

## 2023-04-23 NOTE — Assessment & Plan Note (Signed)
Healthy Needs to work on fitness Will check PSA Prefers no COVID vaccines Recommended flu and RSV for this fall

## 2023-04-23 NOTE — Assessment & Plan Note (Signed)
Still uses CPAP nightly

## 2023-04-23 NOTE — Progress Notes (Signed)
Subjective:    Patient ID: Alex [name], male    DOB: 02/11/1961, 62 y.o.   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 30 tablet 0   atorvastatin (LIPITOR) 80 MG tablet Take 1 tablet (80 mg total) by mouth daily. 30 tablet 0   DULoxetine (CYMBALTA) 60 MG capsule Take 1 capsule (60 mg total) by mouth daily. 90 capsule 3   famotidine (PEPCID) 40 MG tablet TAKE 1 TABLET BY MOUTH TWICE DAILY 180 tablet 0   losartan (COZAAR) 50 MG tablet Take 1 tablet (50 mg total) by mouth daily. 30 tablet 0   metoprolol tartrate (LOPRESSOR) 50 MG tablet Take 1 tablet (50 mg total) by mouth 2 (two) times daily. 60 tablet 0   sildenafil (VIAGRA) 50 MG tablet Take 1 tablet (50 mg total) by mouth daily as needed for erectile dysfunction. 10 tablet 1   No current facility-administered medications on file prior to visit.    No Known Allergies  Past Medical History:  Diagnosis Date   HTN (hypertension)    OSA (obstructive sleep apnea)    PSG 09/23/10 AHI 22   RBBB (right bundle branch block)     Past Surgical History:  Procedure Laterality Date   APPENDECTOMY  1988   CORONARY/GRAFT ACUTE MI REVASCULARIZATION N/A 06/02/2021   Procedure: Coronary/Graft Acute MI Revascularization;  Surgeon: Alwyn Pea, MD;  Location: [facility] INV[facility]IVE [facility];  Service: Cardiovascular;  Laterality: N/A;   fracture L arm     LEFT HEART CATH AND CORONARY ANGIOGRAPHY N/A 06/02/2021   Procedure: LEFT HEART CATH AND CORONARY  ANGIOGRAPHY;  Surgeon: Alwyn Pea, MD;  Location: [facility] INV[facility] CV [facility];  Service: Cardiovascular;  Laterality: N/A;   TONSILLECTOMY     VASECTOMY  1997     Family History  Problem Relation Age of Onset   Stroke Father 22       doing okay   Skin cancer Mother    Stroke Paternal Grandfather        and PGGF   Hypertension Other        dad's side    Diabetes Neg Hx        DM    Coronary artery disease Neg Hx    Prostate cancer Neg Hx        no colon cancer    Colon cancer Neg Hx     Social History   Socioeconomic History   Marital status: Married    Spouse name: Not on file   Number of children: 2   Years of education: Not on file   Highest education level: Not on file  Occupational History   Occupation: Corporate treasurer    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   [employer].        Social Determinants of Health   Financial Resource Strain: Not on file  Food Insecurity: Not on file  Transportation Needs: Not on file  Physical Activity: Not on file  Stress: Not on file  Social Connections: Not on file  Intimate Partner Violence: Not on file   Review of Systems  Constitutional:  Negative for fatigue and unexpected weight change.       Wears seat belt  HENT:  Positive for hearing loss and tinnitus. Negative for dental problem.        Discussed audiology evaluation Keeps up with dentist  Eyes:  Negative for visual disturbance.       No diplopia or unilateral vision loss  Respiratory:  Negative for cough, chest tightness  and shortness of breath.   Cardiovascular:  Negative for chest pain, palpitations and leg swelling.  Gastrointestinal:  Negative for blood in stool and constipation.       No heartburn  Genitourinary:  Negative for hematuria.       Gets stinging feeling when passing urine----variable. Goes back years Satisfied with sildenafil  Musculoskeletal:  Positive for back pain. Negative for joint swelling.       Occasional shoulder aching  Skin:  Negative for rash.       Recent derm visit  Allergic/Immunologic: Positive for environmental allergies. Negative for immunocompromised state.       No meds  Neurological:  Negative for dizziness, syncope, light-headedness and headaches.  Hematological:  Negative for adenopathy. Does not bruise/bleed easily.  Psychiatric/Behavioral:  Negative for dysphoric mood and sleep disturbance.        Objective:   Physical Exam Constitutional:      Appearance: Normal appearance.  HENT:     Mouth/Throat:     Pharynx: No oropharyngeal exudate or posterior oropharyngeal erythema.  Eyes:     Conjunctiva/sclera: Conjunctivae normal.     Pupils: Pupils are equal, round, and reactive to light.  Cardiovascular:     Rate and Rhythm: Normal rate and regular rhythm.     Pulses: Normal pulses.     Heart sounds: No murmur heard.    No gallop.  Pulmonary:     Effort: Pulmonary effort is normal.     Breath sounds: Normal breath sounds. No wheezing or rales.  Abdominal:     Palpations: Abdomen is soft.     Tenderness: There is no abdominal tenderness.  Musculoskeletal:     Cervical back: Neck supple.     Right lower leg: No edema.     Left lower leg: No edema.  Lymphadenopathy:     Cervical: No cervical adenopathy.  Skin:    Findings: No lesion or rash.  Neurological:     General: No focal deficit present.     Mental Status: He is alert and oriented to person, place, and time.  Psychiatric:        Mood and Affect: Mood normal.        Behavior: Behavior  normal.            Assessment & Plan:

## 2023-04-23 NOTE — Assessment & Plan Note (Signed)
BP Readings from Last 3 Encounters:  04/23/23 118/78  10/31/21 118/84  09/12/21 140/90   Controlled on the metoprolol and losartan

## 2023-04-23 NOTE — Assessment & Plan Note (Signed)
No angina now Off brilinta On atorvastatin 80, ASA 81, metoprolol 50 bid, losartan 50 daily

## 2023-04-23 NOTE — Assessment & Plan Note (Signed)
Anxiety controlled on the duloxetine 60 Will continue

## 2023-05-01 ENCOUNTER — Other Ambulatory Visit: Payer: Self-pay

## 2023-05-01 DIAGNOSIS — R972 Elevated prostate specific antigen [PSA]: Secondary | ICD-10-CM

## 2023-05-01 NOTE — Progress Notes (Signed)
Future labs placed. 

## 2023-07-25 ENCOUNTER — Other Ambulatory Visit: Payer: Self-pay | Admitting: Internal Medicine

## 2023-07-25 DIAGNOSIS — N1831 Chronic kidney disease, stage 3a: Secondary | ICD-10-CM

## 2023-07-30 ENCOUNTER — Other Ambulatory Visit: Payer: BC Managed Care – PPO

## 2023-08-07 ENCOUNTER — Other Ambulatory Visit: Payer: Self-pay | Admitting: Internal Medicine

## 2023-11-05 ENCOUNTER — Telehealth: Payer: Self-pay

## 2023-11-05 NOTE — Telephone Encounter (Signed)
 Patient has been scheduled

## 2023-11-05 NOTE — Telephone Encounter (Signed)
 Please schedule patient for lab appt he is overdue for renal function panel.

## 2023-11-06 ENCOUNTER — Other Ambulatory Visit (INDEPENDENT_AMBULATORY_CARE_PROVIDER_SITE_OTHER): Payer: Self-pay

## 2023-11-06 DIAGNOSIS — N1831 Chronic kidney disease, stage 3a: Secondary | ICD-10-CM | POA: Diagnosis not present

## 2023-11-06 DIAGNOSIS — R972 Elevated prostate specific antigen [PSA]: Secondary | ICD-10-CM

## 2023-11-06 LAB — RENAL FUNCTION PANEL
Albumin: 4.7 g/dL (ref 3.5–5.2)
BUN: 22 mg/dL (ref 6–23)
CO2: 29 meq/L (ref 19–32)
Calcium: 9.6 mg/dL (ref 8.4–10.5)
Chloride: 104 meq/L (ref 96–112)
Creatinine, Ser: 1.18 mg/dL (ref 0.40–1.50)
GFR: 66.76 mL/min (ref >60.00)
Glucose, Bld: 99 mg/dL (ref 70–99)
Phosphorus: 4.8 mg/dL — ABNORMAL HIGH (ref 2.3–4.6)
Potassium: 4 meq/L (ref 3.5–5.1)
Sodium: 141 meq/L (ref 135–145)

## 2023-11-06 LAB — PSA: PSA: 7.12 ng/mL — ABNORMAL HIGH (ref 0.10–4.00)

## 2023-11-07 ENCOUNTER — Encounter: Payer: Self-pay | Admitting: Internal Medicine

## 2023-11-08 ENCOUNTER — Other Ambulatory Visit: Payer: Self-pay | Admitting: Internal Medicine

## 2023-11-08 DIAGNOSIS — R972 Elevated prostate specific antigen [PSA]: Secondary | ICD-10-CM

## 2023-11-08 NOTE — Progress Notes (Signed)
Referral placed.

## 2023-11-26 ENCOUNTER — Other Ambulatory Visit: Payer: Self-pay | Admitting: Internal Medicine

## 2023-12-12 ENCOUNTER — Encounter: Payer: Self-pay | Admitting: Urology

## 2023-12-12 ENCOUNTER — Ambulatory Visit: Payer: 59 | Admitting: Urology

## 2023-12-12 VITALS — BP 169/100 | HR 65 | Ht 73.5 in | Wt 216.2 lb

## 2023-12-12 DIAGNOSIS — R972 Elevated prostate specific antigen [PSA]: Secondary | ICD-10-CM | POA: Diagnosis not present

## 2023-12-12 NOTE — Patient Instructions (Addendum)
 Prostate MRI Prep:  1- No ejaculation 48 hours prior to exam  2- No caffeine or carbonated beverages on day of the exam  3- Eat light diet evening prior and day of exam  4- Avoid eating 4 hours prior to exam  5- Fleets enema needs to be done 4 hours prior to exam -See below. Can be purchased at the drug store.        Prostate Cancer Screening  Prostate cancer screening is testing that is done to check for the presence of prostate cancer in men. The prostate gland is a walnut-sized gland that is located below the bladder and in front of the rectum in males. The function of the prostate is to add fluid to semen during ejaculation. Prostate cancer is one of the most common types of cancer in men. Who should have prostate cancer screening? Screening recommendations vary based on age and other risk factors, as well as between the professional organizations who make the recommendations. In general, screening is recommended if: You are age 34 to 69 and have an average risk for prostate cancer. You should talk with your health care provider about your need for screening and how often screening should be done. Because most prostate cancers are slow growing and will not cause death, screening in this age group is generally reserved for men who have a 10- to 15-year life expectancy. You are younger than age 58, and you have these risk factors: Having a father, brother, or uncle who has been diagnosed with prostate cancer. The risk is higher if your family member's cancer occurred at an early age or if you have multiple family members with prostate cancer at an early age. Being a male who is [place] or is of [place] or sub-Saharan African descent. In general, screening is not recommended if: You are younger than age 52. You are between the ages of 4 and 76 and you have no risk factors. You are 18 years of age or older. At this age, the risks that screening can cause are greater than the benefits  that it may provide. If you are at high risk for prostate cancer, your health care provider may recommend that you have screenings more often or that you start screening at a younger age. How is screening for prostate cancer done? The recommended prostate cancer screening test is a blood test called the prostate-specific antigen (PSA) test. PSA is a protein that is made in the prostate. As you age, your prostate naturally produces more PSA. Abnormally high PSA levels may be caused by: Prostate cancer. An enlarged prostate that is not caused by cancer (benign prostatic hyperplasia, or BPH). This condition is very common in older men. A prostate gland infection (prostatitis) or urinary tract infection. Certain medicines such as male hormones (like testosterone) or other medicines that raise testosterone levels. A rectal exam may be done as part of prostate cancer screening to help provide information about the size of your prostate gland. When a rectal exam is performed, it should be done after the PSA level is drawn to avoid any effect on the results. Depending on the PSA results, you may need more tests, such as: A physical exam to check the size of your prostate gland, if not done as part of screening. Blood and imaging tests. A procedure to remove tissue samples from your prostate gland for testing (biopsy). This is the only way to know for certain if you have prostate cancer. What are the benefits  of prostate cancer screening? Screening can help to identify cancer at an early stage, before symptoms start and when the cancer can be treated more easily. There is a small chance that screening may lower your risk of dying from prostate cancer. The chance is small because prostate cancer is a slow-growing cancer, and most men with prostate cancer die from a different cause. What are the risks of prostate cancer screening? The main risk of prostate cancer screening is diagnosing and treating prostate  cancer that would never have caused any symptoms or problems. This is called overdiagnosisand overtreatment. PSA screening cannot tell you if your PSA is high due to cancer or a different cause. A prostate biopsy is the only procedure to diagnose prostate cancer. Even the results of a biopsy may not tell you if your cancer needs to be treated. Slow-growing prostate cancer may not need any treatment other than monitoring, so diagnosing and treating it may cause unnecessary stress or other side effects. Questions to ask your health care provider When should I start prostate cancer screening? What is my risk for prostate cancer? How often do I need screening? What type of screening tests do I need? How do I get my test results? What do my results mean? Do I need treatment? Where to find more information The American Cancer Society: www.cancer.org American Urological Association: www.auanet.org Contact a health care provider if: You have difficulty urinating. You have pain when you urinate or ejaculate. You have blood in your urine or semen. You have pain in your back or in the area of your prostate. Summary Prostate cancer is a common type of cancer in men. The prostate gland is located below the bladder and in front of the rectum. This gland adds fluid to semen during ejaculation. Prostate cancer screening may identify cancer at an early stage, when the cancer can be treated more easily and is less likely to have spread to other areas of the body. The prostate-specific antigen (PSA) test is the recommended screening test for prostate cancer, but it has associated risks. Discuss the risks and benefits of prostate cancer screening with your health care provider. If you are age 45 or older, the risks that screening can cause are greater than the benefits that it may provide. This information is not intended to replace advice given to you by your health care provider. Make sure you discuss any  questions you have with your health care provider. Transrectal Ultrasound-Guided Prostate Biopsy A transrectal ultrasound-guided prostate biopsy is a procedure to remove samples of prostate tissue for testing. The prostate is a walnut-sized gland that is located below the bladder and in front of the rectum. During this procedure, a small device (probe) is lubricated and put inside the rectum. The probe sends out sound waves that make a picture of the prostate and surrounding tissues (transrectal ultrasound). The images are used to help guide the process of removing the samples. The samples are taken to a lab to be checked for prostate cancer. This procedure is usually done to evaluate the prostate gland of men who have raised (elevated) levels of prostate-specific antigen (PSA), which can be a sign of prostate cancer or prostate enlargement related to aging (benign prostatic hyperplasia, or BPH). Tell a health care provider about: Any allergies you have. All medicines you are taking, including vitamins, herbs, eye drops, creams, and over-the-counter medicines. Any problems you or family members have had with anesthetic medicines. Any bleeding problems you have. Any surgeries you  have had. Any medical conditions you have. Any prostate infections you have had. What are the risks? Generally, this is a safe procedure. However, problems may occur, including: Prostate infection. Bleeding from the rectum. Blood in the urine. Allergic reactions to medicines. Damage to surrounding structures such as blood vessels, organs, or muscles. Difficulty passing urine. Nerve damage. This is usually temporary. What happens before the procedure? Medicines Ask your health care provider about: Changing or stopping your regular medicines. This is especially important if you are taking diabetes medicines or blood thinners. Taking medicines such as aspirin and ibuprofen. These medicines can thin your blood. Do not  take these medicines unless your health care provider tells you to take them. Taking over-the-counter medicines, vitamins, herbs, and supplements. General instructions Follow instructions from your health care provider about eating and drinking. In most instances, you will not need to stop eating and drinking completely before the procedure. You will be given an enema. During an enema, a liquid is injected into your rectum to clear out waste. You may have a blood or urine sample taken. Ask your health care provider what steps will be taken to help prevent infection. These steps may include: Washing skin with a germ-killing soap. Taking antibiotic medicine. If you will be going home right after the procedure, plan to have a responsible adult: Take you home from the hospital or clinic. You will not be allowed to drive. Care for you for the time you are told. What happens during the procedure?  An IV will be inserted into one of your veins. You will be given one or both of the following: A medicine to help you relax (sedative). A medicine to numb the area (local anesthetic). You will be placed on your left side, and your knees will be bent toward your chest. A probe with lubricated gel will be placed into your rectum, and images will be taken of your prostate and surrounding structures. Numbing medicine will be injected into your prostate. A biopsy needle will be inserted through your rectum or perineum and guided to your prostate using the ultrasound images. Prostate tissue samples will be removed, and the needle and probe will then be removed. The biopsy samples will be sent to a lab to be tested. The procedure may vary among health care providers and hospitals. What happens after the procedure? Your blood pressure, heart rate, breathing rate, and blood oxygen level will be monitored until you leave the hospital or clinic. You may have some discomfort in the rectal area. You will be given  pain medicine as needed. If you were given a sedative during the procedure, it can affect you for several hours. Do not drive or operate machinery until your health care provider says that it is safe. It is up to you to get the results of your procedure. Ask your health care provider, or the department that is doing the procedure, when your results will be ready. Keep all follow-up visits. This is important. Summary A transrectal ultrasound-guided biopsy removes samples of tissue from your prostate using ultrasound-guided sound waves to help guide the process. This procedure is usually done to evaluate the prostate gland of men who have raised (elevated) levels of prostate-specific antigen (PSA), which can be a sign of prostate cancer or prostate enlargement related to aging. After your procedure, you may feel some discomfort in the rectal area. Plan to have a responsible adult take you home from the hospital or clinic, and follow up with your  health care provider for your results. This information is not intended to replace advice given to you by your health care provider. Make sure you discuss any questions you have with your health care provider. Document Revised: 03/07/2021 Document Reviewed: 03/07/2021 Elsevier Patient Education  2024 [employer].

## 2023-12-12 NOTE — Progress Notes (Signed)
 12/12/23 11:24 AM   Alex [name] 01-May-1961 [id number]  CC: Elevated PSA, ED  HPI: 63 year old relatively healthy male with CAD, sleep apnea and ED referred for elevated PSA.  PSA has continued to rise over the last few years including 1.6 in 2015, 3.1 2018, 4.30 Jan 2020, 5.31 March 2023, and most recently 7.1 in February 2025.  He denies any urinary symptoms, uses sildenafil for ED.  Denies any family history of prostate cancer.   PMH: Past Medical History:  Diagnosis Date   HTN (hypertension)    OSA (obstructive sleep apnea)    PSG 09/23/10 AHI 22   RBBB (right bundle branch block)     Surgical History: Past Surgical History:  Procedure Laterality Date   APPENDECTOMY  1988   CORONARY/GRAFT ACUTE MI REVASCULARIZATION N/A 06/02/2021   Procedure: Coronary/Graft Acute MI Revascularization;  Surgeon: Alwyn Pea, MD;  Location: [facility] INVASIVE CV LAB;  Service: Cardiovascular;  Laterality: N/A;   fracture L arm     LEFT HEART CATH AND CORONARY ANGIOGRAPHY N/A 06/02/2021   Procedure: LEFT HEART CATH AND CORONARY ANGIOGRAPHY;  Surgeon: Alwyn Pea, MD;  Location: [facility] INVASIVE CV LAB;  Service: Cardiovascular;  Laterality: N/A;   TONSILLECTOMY     VASECTOMY  1997     Family History: Family History  Problem Relation Age of Onset   Stroke Father 64       doing okay   Skin cancer Mother    Stroke Paternal Grandfather        and PGGF   Hypertension Other        dad's side    Diabetes Neg Hx        DM    Coronary artery disease Neg Hx    Prostate cancer Neg Hx        no colon cancer    Colon cancer Neg Hx     Social History:  reports that he has never smoked. He has been exposed to tobacco smoke. He quit smokeless tobacco use about 8 years ago.  His smokeless tobacco use included chew. He reports current alcohol use. He reports current drug use. Drug: Marijuana.  Physical Exam: BP (!) 169/100 (BP Location: Left Arm, Patient Position: Sitting, Cuff Size:  Normal)   Pulse 65   Ht 6' 1.5" (1.867 m)   Wt 216 lb 3.2 oz (98.1 kg)   SpO2 96%   BMI 28.14 kg/m    Constitutional:  Alert and oriented, No acute distress. Cardiovascular: No clubbing, cyanosis, or edema. Respiratory: Normal respiratory effort, no increased work of breathing. GI: Abdomen is soft, nontender, nondistended, no abdominal masses   Laboratory Data: PSA history reviewed, see HPI   Assessment & Plan:   63 year old male with elevated PSA of 7.12 that has steadily risen over the last few years.  We reviewed the implications of an elevated PSA and the uncertainty surrounding it. In general, a man's PSA increases with age and is produced by both normal and cancerous prostate tissue. The differential diagnosis for elevated PSA includes BPH, prostate cancer, infection/prostatitis, recent intercourse/ejaculation, recent urethroscopic manipulation (foley placement/cystoscopy) or trauma. Management of an elevated PSA can include observation/surveillance, prostate MRI, or prostate biopsy and we discussed this in detail. Our goal is to detect clinically significant prostate cancers, and manage with either active surveillance, surgery, or radiation for localized disease. Risks of prostate biopsy include bleeding, infection (including life threatening sepsis), pain, and lower urinary symptoms. Hematuria, hematospermia, and blood in the  stool are all common after biopsy and can persist up to 4 weeks.  Prostate MRI, will call with results and understands need for biopsy if any abnormal findings seen on prostate MRI  Legrand Rams, MD 12/12/2023  [facility] [address], [address] [place], [place] [address] [phone]

## 2023-12-21 ENCOUNTER — Ambulatory Visit
Admission: RE | Admit: 2023-12-21 | Discharge: 2023-12-21 | Disposition: A | Discharge status: Home / Self Care | Source: Ambulatory Visit | Attending: Urology | Admitting: Urology

## 2023-12-21 DIAGNOSIS — R972 Elevated prostate specific antigen [PSA]: Secondary | ICD-10-CM | POA: Insufficient documentation

## 2023-12-21 MED ORDER — GADOBUTROL 1 MMOL/ML IV SOLN
9.0000 mL | Freq: Once | INTRAVENOUS | Status: AC | PRN
  Administered 2023-12-21: 9 mL via INTRAVENOUS

## 2023-12-25 DIAGNOSIS — R972 Elevated prostate specific antigen [PSA]: Secondary | ICD-10-CM

## 2023-12-25 NOTE — Telephone Encounter (Signed)
 PHI ordered. Appts scheduled.

## 2024-03-25 ENCOUNTER — Other Ambulatory Visit

## 2024-03-25 DIAGNOSIS — R972 Elevated prostate specific antigen [PSA]: Secondary | ICD-10-CM

## 2024-03-27 ENCOUNTER — Ambulatory Visit: Payer: Self-pay | Admitting: Urology

## 2024-03-27 LAB — REFLEX INFORMATION

## 2024-03-27 LAB — PROSTATE HEALTH INDEX: Prostate Specific Ag: 2.5 ng/mL (ref 0.0–3.9)

## 2024-04-01 ENCOUNTER — Ambulatory Visit: Admitting: Urology

## 2024-04-01 VITALS — BP 146/104 | HR 68 | Ht 73.0 in | Wt 205.7 lb

## 2024-04-01 DIAGNOSIS — N529 Male erectile dysfunction, unspecified: Secondary | ICD-10-CM

## 2024-04-01 DIAGNOSIS — R972 Elevated prostate specific antigen [PSA]: Secondary | ICD-10-CM

## 2024-04-01 MED ORDER — TADALAFIL 10 MG PO TABS: 10.0000 mg | ORAL_TABLET | Freq: Every day | ORAL | 11 refills | Status: DC | PRN

## 2024-04-01 NOTE — Progress Notes (Signed)
   04/01/2024 10:09 AM   Alex [name] 07/05/1961 [id number]  Reason for visit: Follow up elevated PSA, ED  HPI: 63 year old male with CAD, sleep apnea, ED referred for rising PSA in March 2025.  This had increased from 1.6 in 2015, 3.1 2018, 4.30 Jan 2020, 5.31 March 2023, and 7.1 in February 2025.  He opted for a prostate MRI in March 2025 which showed a 50 g prostate with no abnormal findings.  Moderate size median lobe noted.  Plan was to follow-up for a PHI score.  At that time in July 2025 PSA returned to normal at 2.5 so no reflex was sent.  Reassurance provided regarding normal PSA at this time and normal prostate MRI.  Can continue screening every 1 to 2 years through age 72.  He also has ED that has been refractory to sildenafil .  He is interested in trial of other options.  Risks and benefits of Cialis  reviewed.  Trial of Cialis  10 to 20 mg on demand for ED RTC 1 year PSA reflex to free prior  Alex JAYSON Burnet, MD  [facility] [address] [place], [place] [address] [phone]

## 2024-04-01 NOTE — Patient Instructions (Signed)
 Prostate Cancer Screening  Prostate cancer screening is testing that is done to check for the presence of prostate cancer in men. The prostate gland is a walnut-sized gland that is located below the bladder and in front of the rectum in males. The function of the prostate is to add fluid to semen during ejaculation. Prostate cancer is one of the most common types of cancer in men. Who should have prostate cancer screening? Screening recommendations vary based on age and other risk factors, as well as between the professional organizations who make the recommendations. In general, screening is recommended if: You are age 62 to 46 and have an average risk for prostate cancer. You should talk with your health care provider about your need for screening and how often screening should be done. Because most prostate cancers are slow growing and will not cause death, screening in this age group is generally reserved for men who have a 10- to 15-year life expectancy. You are younger than age 62, and you have these risk factors: Having a father, brother, or uncle who has been diagnosed with prostate cancer. The risk is higher if your family member's cancer occurred at an early age or if you have multiple family members with prostate cancer at an early age. Being a male who is [place] or is of [place] or sub-Saharan African descent. In general, screening is not recommended if: You are younger than age 62. You are between the ages of 93 and 30 and you have no risk factors. You are 62 years of age or older. At this age, the risks that screening can cause are greater than the benefits that it may provide. If you are at high risk for prostate cancer, your health care provider may recommend that you have screenings more often or that you start screening at a younger age. How is screening for prostate cancer done? The recommended prostate cancer screening test is a blood test called the prostate-specific antigen  (PSA) test. PSA is a protein that is made in the prostate. As you age, your prostate naturally produces more PSA. Abnormally high PSA levels may be caused by: Prostate cancer. An enlarged prostate that is not caused by cancer (benign prostatic hyperplasia, or BPH). This condition is very common in older men. A prostate gland infection (prostatitis) or urinary tract infection. Certain medicines such as male hormones (like testosterone) or other medicines that raise testosterone levels. A rectal exam may be done as part of prostate cancer screening to help provide information about the size of your prostate gland. When a rectal exam is performed, it should be done after the PSA level is drawn to avoid any effect on the results. Depending on the PSA results, you may need more tests, such as: A physical exam to check the size of your prostate gland, if not done as part of screening. Blood and imaging tests. A procedure to remove tissue samples from your prostate gland for testing (biopsy). This is the only way to know for certain if you have prostate cancer. What are the benefits of prostate cancer screening? Screening can help to identify cancer at an early stage, before symptoms start and when the cancer can be treated more easily. There is a small chance that screening may lower your risk of dying from prostate cancer. The chance is small because prostate cancer is a slow-growing cancer, and most men with prostate cancer die from a different cause. What are the risks of prostate cancer screening? The  main risk of prostate cancer screening is diagnosing and treating prostate cancer that would never have caused any symptoms or problems. This is called overdiagnosisand overtreatment. PSA screening cannot tell you if your PSA is high due to cancer or a different cause. A prostate biopsy is the only procedure to diagnose prostate cancer. Even the results of a biopsy may not tell you if your cancer needs to  be treated. Slow-growing prostate cancer may not need any treatment other than monitoring, so diagnosing and treating it may cause unnecessary stress or other side effects. Questions to ask your health care provider When should I start prostate cancer screening? What is my risk for prostate cancer? How often do I need screening? What type of screening tests do I need? How do I get my test results? What do my results mean? Do I need treatment? Where to find more information The American Cancer Society: www.cancer.org American Urological Association: www.auanet.org Contact a health care provider if: You have difficulty urinating. You have pain when you urinate or ejaculate. You have blood in your urine or semen. You have pain in your back or in the area of your prostate. Summary Prostate cancer is a common type of cancer in men. The prostate gland is located below the bladder and in front of the rectum. This gland adds fluid to semen during ejaculation. Prostate cancer screening may identify cancer at an early stage, when the cancer can be treated more easily and is less likely to have spread to other areas of the body. The prostate-specific antigen (PSA) test is the recommended screening test for prostate cancer, but it has associated risks. Discuss the risks and benefits of prostate cancer screening with your health care provider. If you are age 62 or older, the risks that screening can cause are greater than the benefits that it may provide. This information is not intended to replace advice given to you by your health care provider. Make sure you discuss any questions you have with your health care provider. Document Revised: 03/07/2021 Document Reviewed: 03/07/2021 Elsevier Patient Education  2024 [employer].

## 2024-05-01 ENCOUNTER — Ambulatory Visit: Admitting: Urology

## 2024-06-11 ENCOUNTER — Telehealth: Payer: Self-pay

## 2024-06-11 MED ORDER — FAMOTIDINE 40 MG PO TABS: 40.0000 mg | ORAL_TABLET | Freq: Two times a day (BID) | ORAL | 0 refills | Status: DC

## 2024-06-11 NOTE — Telephone Encounter (Signed)
 Received refill request for famotidine .  He was last seen for CPE 04-23-23  He needs to establish with a new provider. Rx sent in for 90 days.

## 2024-08-22 NOTE — Progress Notes (Addendum)
 Established Patient Visit   Chief Complaint: Chief Complaint  Patient presents with   Follow-up    1 year follow up    Date of Service: 08/01/2024 Date of Birth: May 17, 1961 PCP: Jimmy Charlie FERNS, MD  History of Present Illness: Mr. Malkiewicz is a 63 y.o.male patient who status post STEMI PCI stent of LCx September 2022 patient on GDMT statin therapy LDL (55 denies any recent symptoms states to feel reasonably well.  History of hypertension hyperlipidemia GERD now here for routine follow-up EKG normal sinus rhythm with reasonable rate in the 60s.  Moderate disease left ventricular function and EF was around 45%.   Past Medical and Surgical History  Past Medical History Past Medical History:  Diagnosis Date   CHF (congestive heart failure) (CMS/HHS-HCC)    Hyperlipidemia    Hypertension    MI (myocardial infarction) (CMS/HHS-HCC) 06/02/2021    Past Surgical History He has a past surgical history that includes Appendectomy.   Medications and Allergies  Current Medications  Current Outpatient Medications  Medication Sig Dispense Refill   aspirin  81 MG chewable tablet Take by mouth     atorvastatin  (LIPITOR ) 80 MG tablet TAKE 1 TABLET BY MOUTH ONCE EVERY EVENING 90 tablet 2   DULoxetine  (CYMBALTA ) 60 MG DR capsule      famotidine  (PEPCID ) 40 MG tablet Take 40 mg by mouth at bedtime     losartan  (COZAAR ) 50 MG tablet TAKE 1 TABLET BY MOUTH ONCE DAILY 90 tablet 2   nitroGLYcerin  (NITROSTAT ) 0.4 MG SL tablet Place 1 tablet (0.4 mg total) under the tongue every 5 (five) minutes as needed 25 tablet 11   tadalafiL  (CIALIS ) 10 MG tablet Take 10-20 mg by mouth once daily as needed     metoprolol  TARTrate (LOPRESSOR ) 25 MG tablet TAKE 1 TABLET BY MOUTH TWICE DAILY 90 tablet 3   sildenafiL  (VIAGRA ) 50 MG tablet Take 1 tablet (50 mg total) by mouth once daily as needed for Erectile Dysfunction for up to 30 days (Patient not taking: Reported on 08/01/2024) 10 tablet 6   No current  facility-administered medications for this visit.    Allergies: Patient has no known allergies.  Social and Family History  Social History  reports that he has quit smoking. His smoking use included cigarettes. He has never used smokeless tobacco. He reports current alcohol use. He reports current drug use.  Family History Family History  Problem Relation Name Age of Onset   Stroke Father      Review of Systems   Review of Systems: The patient denies chest pain, shortness of breath, orthopnea, paroxysmal nocturnal dyspnea, pedal edema, palpitations, heart racing, presyncope, syncope. Review of 12 Systems is negative except as described above.  Physical Examination   Vitals:BP (!) 120/92   Pulse 63   Ht 185.4 cm (6' 1)   Wt 91.9 kg (202 lb 9.6 oz)   SpO2 97%   BMI 26.73 kg/m  Ht:185.4 cm (6' 1) Wt:91.9 kg (202 lb 9.6 oz) ADJ:Anib surface area is 2.18 meters squared. Body mass index is 26.73 kg/m.  HEENT: Pupils equally reactive to light and accomodation  Neck: Supple without thyromegaly, carotid pulses 2+ Lungs: clear to auscultation bilaterally; no wheezes, rales, rhonchi Heart: Regular rate and rhythm.  No gallops, murmurs or rub Abdomen: soft nontender, nondistended, with normal bowel sounds Extremities: no cyanosis, clubbing, or edema Peripheral Pulses: 2+ in all extremities, 2+ femoral pulses bilaterally Neurologic: Alert and oriented X3; speech intact; face symmetrical; moves all extremities  well  Assessment   63 y.o. male with  1. Coronary artery disease, unspecified vessel or lesion type, unspecified whether angina present, unspecified whether native or transplanted heart   2. Cardiomyopathy, unspecified type (CMS/HHS-HCC)   3. Essential hypertension   4. Mixed hyperlipidemia   5. Hyperlipidemia, mixed   6. ST elevation myocardial infarction involving left circumflex coronary artery (CMS/HHS-HCC)   7. Coronary artery disease involving native coronary artery  of native heart without angina pectoris   8. DOE (dyspnea on exertion)   9. History of coronary artery stent placement   10. Gastroesophageal reflux disease, unspecified whether esophagitis present   11. OSA (obstructive sleep apnea)        Plan  Coronary artery disease previous STEMI with PCI and stent September 2022 to circumflex with DES continue current therapy Cardiomyopathy systolic currently on metoprolol  losartan  with reasonable management compensated continue current therapy Hypertension hypertension borderline controlled 120/92 goal is less than 130/80 currently on losartan  and metoprolol  will consider advancing doses or adding ACE thiazide diuretic Hyperlipidemia continue high intensity statin Lipitor  LDL goal should be less than 55 History of ST elevation myocardial infarction involving left circumflex continue aspirin  statin ARB beta-blocker Dyspnea on exertion chronic low-dose diuretics consider inhalers continue conservative medical therapy consider follow-up with pulmonary GERD agree with famotidine  consider PPI like omeprazole Protonix as needed Obstructive sleep apnea sleep study CPAP if indicated recommend follow-up with pulmonary Have the patient follow-up in 1 year     Return in about 1 year (around 08/01/2025).  DWAYNE D CALLWOOD, MD  This dictation was prepared with dragon dictation.  Any transcription errors that result from this process are unintentional.

## 2024-09-29 ENCOUNTER — Emergency Department

## 2024-09-29 ENCOUNTER — Encounter
Admission: EM | Disposition: A | Discharge status: Home / Self Care | Payer: Self-pay | Source: Home / Self Care | Attending: Emergency Medicine

## 2024-09-29 ENCOUNTER — Observation Stay
Admission: EM | Admit: 2024-09-29 | Discharge: 2024-09-30 | Disposition: A | Discharge status: Home / Self Care | Attending: Obstetrics and Gynecology | Admitting: Obstetrics and Gynecology

## 2024-09-29 ENCOUNTER — Inpatient Hospital Stay
Admit: 2024-09-29 | Discharge: 2024-09-29 | Disposition: A | Discharge status: Home / Self Care | Attending: Cardiovascular Disease | Admitting: Cardiovascular Disease

## 2024-09-29 ENCOUNTER — Other Ambulatory Visit: Payer: Self-pay

## 2024-09-29 DIAGNOSIS — I200 Unstable angina: Secondary | ICD-10-CM

## 2024-09-29 DIAGNOSIS — Z7982 Long term (current) use of aspirin: Secondary | ICD-10-CM | POA: Insufficient documentation

## 2024-09-29 DIAGNOSIS — K219 Gastro-esophageal reflux disease without esophagitis: Secondary | ICD-10-CM | POA: Diagnosis not present

## 2024-09-29 DIAGNOSIS — I213 ST elevation (STEMI) myocardial infarction of unspecified site: Secondary | ICD-10-CM

## 2024-09-29 DIAGNOSIS — Z79899 Other long term (current) drug therapy: Secondary | ICD-10-CM | POA: Diagnosis not present

## 2024-09-29 DIAGNOSIS — E785 Hyperlipidemia, unspecified: Secondary | ICD-10-CM

## 2024-09-29 DIAGNOSIS — D72829 Elevated white blood cell count, unspecified: Secondary | ICD-10-CM | POA: Insufficient documentation

## 2024-09-29 DIAGNOSIS — R079 Chest pain, unspecified: Secondary | ICD-10-CM

## 2024-09-29 DIAGNOSIS — I5023 Acute on chronic systolic (congestive) heart failure: Secondary | ICD-10-CM | POA: Diagnosis not present

## 2024-09-29 DIAGNOSIS — F32A Depression, unspecified: Secondary | ICD-10-CM | POA: Insufficient documentation

## 2024-09-29 DIAGNOSIS — I272 Pulmonary hypertension, unspecified: Secondary | ICD-10-CM

## 2024-09-29 DIAGNOSIS — I25110 Atherosclerotic heart disease of native coronary artery with unstable angina pectoris: Principal | ICD-10-CM | POA: Insufficient documentation

## 2024-09-29 DIAGNOSIS — F1290 Cannabis use, unspecified, uncomplicated: Secondary | ICD-10-CM | POA: Insufficient documentation

## 2024-09-29 DIAGNOSIS — I10 Essential (primary) hypertension: Secondary | ICD-10-CM | POA: Diagnosis not present

## 2024-09-29 DIAGNOSIS — I2510 Atherosclerotic heart disease of native coronary artery without angina pectoris: Secondary | ICD-10-CM

## 2024-09-29 DIAGNOSIS — G4733 Obstructive sleep apnea (adult) (pediatric): Secondary | ICD-10-CM | POA: Insufficient documentation

## 2024-09-29 DIAGNOSIS — Z23 Encounter for immunization: Secondary | ICD-10-CM | POA: Diagnosis not present

## 2024-09-29 HISTORY — PX: CORONARY/GRAFT ACUTE MI REVASCULARIZATION: CATH118305

## 2024-09-29 HISTORY — PX: LEFT HEART CATH AND CORONARY ANGIOGRAPHY: CATH118249

## 2024-09-29 LAB — LIPID PANEL
Cholesterol: 102 mg/dL (ref 0–200)
HDL: 46 mg/dL
LDL Cholesterol: 52 mg/dL (ref 0–99)
Total CHOL/HDL Ratio: 2.2 ratio
Triglycerides: 24 mg/dL
VLDL: 5 mg/dL (ref 0–40)

## 2024-09-29 LAB — TROPONIN T, HIGH SENSITIVITY
Troponin T High Sensitivity: <15 ng/L (ref 0–19)
Troponin T High Sensitivity: 15 ng/L (ref 0–19)

## 2024-09-29 LAB — CBC
HCT: 43.6 % (ref 39.0–52.0)
HCT: 42.2 % (ref 39.0–52.0)
Hemoglobin: 14.8 g/dL (ref 13.0–17.0)
Hemoglobin: 14.1 g/dL (ref 13.0–17.0)
MCH: 28.8 pg (ref 26.0–34.0)
MCH: 28.2 pg (ref 26.0–34.0)
MCHC: 33.9 g/dL (ref 30.0–36.0)
MCHC: 33.4 g/dL (ref 30.0–36.0)
MCV: 84.8 fL (ref 80.0–100.0)
MCV: 84.4 fL (ref 80.0–100.0)
Platelets: 230 K/uL (ref 150–400)
Platelets: 175 K/uL (ref 150–400)
RBC: 5.14 MIL/uL (ref 4.22–5.81)
RBC: 5 MIL/uL (ref 4.22–5.81)
RDW: 12.3 % (ref 11.5–15.5)
RDW: 12.2 % (ref 11.5–15.5)
WBC: 17.6 K/uL — ABNORMAL HIGH (ref 4.0–10.5)
WBC: 11 K/uL — ABNORMAL HIGH (ref 4.0–10.5)
nRBC: 0 % (ref 0.0–0.2)
nRBC: 0 % (ref 0.0–0.2)

## 2024-09-29 LAB — COMPREHENSIVE METABOLIC PANEL WITH GFR
ALT: 26 U/L (ref 0–44)
AST: 22 U/L (ref 15–41)
Albumin: 4.5 g/dL (ref 3.5–5.0)
Alkaline Phosphatase: 131 U/L — ABNORMAL HIGH (ref 38–126)
Anion gap: 12 (ref 5–15)
BUN: 19 mg/dL (ref 8–23)
CO2: 24 mmol/L (ref 22–32)
Calcium: 9.3 mg/dL (ref 8.9–10.3)
Chloride: 104 mmol/L (ref 98–111)
Creatinine, Ser: 1.03 mg/dL (ref 0.61–1.24)
GFR, Estimated: >60 mL/min
Glucose, Bld: 120 mg/dL — ABNORMAL HIGH (ref 70–99)
Potassium: 3.4 mmol/L — ABNORMAL LOW (ref 3.5–5.1)
Sodium: 139 mmol/L (ref 135–145)
Total Bilirubin: 1.9 mg/dL — ABNORMAL HIGH (ref 0.0–1.2)
Total Protein: 7 g/dL (ref 6.5–8.1)

## 2024-09-29 LAB — BASIC METABOLIC PANEL WITH GFR
Anion gap: 22 — ABNORMAL HIGH (ref 5–15)
BUN: 21 mg/dL (ref 8–23)
CO2: 17 mmol/L — ABNORMAL LOW (ref 22–32)
Calcium: 9.9 mg/dL (ref 8.9–10.3)
Chloride: 102 mmol/L (ref 98–111)
Creatinine, Ser: 1.21 mg/dL (ref 0.61–1.24)
GFR, Estimated: >60 mL/min
Glucose, Bld: 164 mg/dL — ABNORMAL HIGH (ref 70–99)
Potassium: 3.1 mmol/L — ABNORMAL LOW (ref 3.5–5.1)
Sodium: 140 mmol/L (ref 135–145)

## 2024-09-29 LAB — MRSA NEXT GEN BY PCR, NASAL: MRSA by PCR Next Gen: NOT DETECTED

## 2024-09-29 LAB — HEMOGLOBIN A1C
Hgb A1c MFr Bld: 5.7 % — ABNORMAL HIGH (ref 4.8–5.6)
Mean Plasma Glucose: 116.89 mg/dL

## 2024-09-29 LAB — PROTIME-INR
INR: 1.2 (ref 0.8–1.2)
Prothrombin Time: 15.6 s — ABNORMAL HIGH (ref 11.4–15.2)

## 2024-09-29 LAB — APTT: aPTT: 142 s — ABNORMAL HIGH (ref 24–36)

## 2024-09-29 LAB — LACTIC ACID, PLASMA
Lactic Acid, Venous: 1.9 mmol/L (ref 0.5–1.9)
Lactic Acid, Venous: 0.9 mmol/L (ref 0.5–1.9)

## 2024-09-29 LAB — POCT ACTIVATED CLOTTING TIME: Activated Clotting Time: 199 s

## 2024-09-29 LAB — GLUCOSE, CAPILLARY: Glucose-Capillary: 143 mg/dL — ABNORMAL HIGH (ref 70–99)

## 2024-09-29 MED ORDER — VERAPAMIL HCL 2.5 MG/ML IV SOLN
INTRAVENOUS | Status: AC
  Filled 2024-09-29: qty 2

## 2024-09-29 MED ORDER — OXYCODONE HCL 5 MG PO TABS: 5.0000 mg | ORAL_TABLET | ORAL | Status: DC | PRN

## 2024-09-29 MED ORDER — ONDANSETRON HCL 4 MG/2ML IJ SOLN
4.0000 mg | Freq: Once | INTRAMUSCULAR | Status: AC
  Administered 2024-09-29: 4 mg via INTRAVENOUS
  Filled 2024-09-29: qty 2

## 2024-09-29 MED ORDER — LIDOCAINE HCL (PF) 1 % IJ SOLN
INTRAMUSCULAR | Status: DC | PRN
  Administered 2024-09-29 (×2): 5 mL

## 2024-09-29 MED ORDER — METOPROLOL TARTRATE 25 MG PO TABS
25.0000 mg | ORAL_TABLET | Freq: Two times a day (BID) | ORAL | Status: DC
  Administered 2024-09-29 (×2): 25 mg via ORAL
  Filled 2024-09-29 (×3): qty 1

## 2024-09-29 MED ORDER — ORAL CARE MOUTH RINSE: 15.0000 mL | OROMUCOSAL | Status: DC | PRN

## 2024-09-29 MED ORDER — ASPIRIN 81 MG PO CHEW
81.0000 mg | CHEWABLE_TABLET | Freq: Every day | ORAL | Status: DC
  Administered 2024-09-30: 81 mg via ORAL
  Filled 2024-09-29: qty 1

## 2024-09-29 MED ORDER — HYDRALAZINE HCL 20 MG/ML IJ SOLN
10.0000 mg | INTRAMUSCULAR | Status: DC | PRN
  Administered 2024-09-29 – 2024-09-30 (×2): 10 mg via INTRAVENOUS
  Filled 2024-09-29 (×2): qty 1

## 2024-09-29 MED ORDER — ENOXAPARIN SODIUM 40 MG/0.4ML IJ SOSY: 40.0000 mg | PREFILLED_SYRINGE | INTRAMUSCULAR | Status: DC

## 2024-09-29 MED ORDER — HEPARIN (PORCINE) IN NACL 1000-0.9 UT/500ML-% IV SOLN
INTRAVENOUS | Status: AC
  Filled 2024-09-29: qty 1000

## 2024-09-29 MED ORDER — HEPARIN SODIUM (PORCINE) 5000 UNIT/ML IJ SOLN
4000.0000 [IU] | Freq: Once | INTRAMUSCULAR | Status: AC
  Administered 2024-09-29: 4000 [IU] via INTRAVENOUS
  Filled 2024-09-29: qty 1

## 2024-09-29 MED ORDER — DULOXETINE HCL 30 MG PO CPEP
60.0000 mg | ORAL_CAPSULE | Freq: Every day | ORAL | Status: DC
  Administered 2024-09-29 – 2024-09-30 (×2): 60 mg via ORAL
  Filled 2024-09-29 (×2): qty 2

## 2024-09-29 MED ORDER — ONDANSETRON HCL 4 MG/2ML IJ SOLN: 4.0000 mg | Freq: Four times a day (QID) | INTRAMUSCULAR | Status: DC | PRN

## 2024-09-29 MED ORDER — HEPARIN SODIUM (PORCINE) 1000 UNIT/ML IJ SOLN
INTRAMUSCULAR | Status: AC
  Filled 2024-09-29: qty 10

## 2024-09-29 MED ORDER — SODIUM CHLORIDE 0.9% FLUSH: 3.0000 mL | INTRAVENOUS | Status: DC | PRN

## 2024-09-29 MED ORDER — ATORVASTATIN CALCIUM 80 MG PO TABS
80.0000 mg | ORAL_TABLET | Freq: Every day | ORAL | Status: DC
  Administered 2024-09-29 – 2024-09-30 (×2): 80 mg via ORAL
  Filled 2024-09-29 (×2): qty 1

## 2024-09-29 MED ORDER — MIDAZOLAM HCL 2 MG/2ML IJ SOLN
INTRAMUSCULAR | Status: AC
  Filled 2024-09-29: qty 2

## 2024-09-29 MED ORDER — CHLORHEXIDINE GLUCONATE CLOTH 2 % EX PADS
6.0000 | MEDICATED_PAD | Freq: Every day | CUTANEOUS | Status: DC
  Administered 2024-09-29 – 2024-09-30 (×2): 6 via TOPICAL

## 2024-09-29 MED ORDER — SODIUM CHLORIDE 0.9% FLUSH
3.0000 mL | Freq: Two times a day (BID) | INTRAVENOUS | Status: DC
  Administered 2024-09-29 – 2024-09-30 (×2): 3 mL via INTRAVENOUS

## 2024-09-29 MED ORDER — PNEUMOCOCCAL 20-VAL CONJ VACC 0.5 ML IM SUSY
0.5000 mL | PREFILLED_SYRINGE | INTRAMUSCULAR | Status: AC
  Administered 2024-09-30: 0.5 mL via INTRAMUSCULAR
  Filled 2024-09-29: qty 0.5

## 2024-09-29 MED ORDER — LIDOCAINE HCL 1 % IJ SOLN
INTRAMUSCULAR | Status: AC
  Filled 2024-09-29: qty 20

## 2024-09-29 MED ORDER — HEPARIN (PORCINE) IN NACL 2000-0.9 UNIT/L-% IV SOLN
INTRAVENOUS | Status: DC | PRN
  Administered 2024-09-29: 1000 mL

## 2024-09-29 MED ORDER — NITROGLYCERIN 0.4 MG SL SUBL
0.4000 mg | SUBLINGUAL_TABLET | SUBLINGUAL | Status: DC | PRN
  Administered 2024-09-29 (×2): 0.4 mg via SUBLINGUAL
  Filled 2024-09-29 (×2): qty 1

## 2024-09-29 MED ORDER — ASPIRIN 325 MG PO TABS
ORAL_TABLET | ORAL | Status: AC
  Filled 2024-09-29: qty 4

## 2024-09-29 MED ORDER — POLYETHYLENE GLYCOL 3350 17 G PO PACK: 17.0000 g | PACK | Freq: Every day | ORAL | Status: DC | PRN

## 2024-09-29 MED ORDER — ENOXAPARIN SODIUM 40 MG/0.4ML IJ SOSY
40.0000 mg | PREFILLED_SYRINGE | INTRAMUSCULAR | Status: DC
  Administered 2024-09-30: 40 mg via SUBCUTANEOUS
  Filled 2024-09-29: qty 0.4

## 2024-09-29 MED ORDER — INFLUENZA VIRUS VACC SPLIT PF (FLUZONE) 0.5 ML IM SUSY
0.5000 mL | PREFILLED_SYRINGE | INTRAMUSCULAR | Status: AC
  Administered 2024-09-30: 0.5 mL via INTRAMUSCULAR
  Filled 2024-09-29: qty 0.5

## 2024-09-29 MED ORDER — FAMOTIDINE 20 MG PO TABS
40.0000 mg | ORAL_TABLET | Freq: Two times a day (BID) | ORAL | Status: DC
  Administered 2024-09-29 – 2024-09-30 (×3): 40 mg via ORAL
  Filled 2024-09-29 (×3): qty 2

## 2024-09-29 MED ORDER — ASPIRIN 81 MG PO CHEW
324.0000 mg | CHEWABLE_TABLET | Freq: Once | ORAL | Status: AC
  Administered 2024-09-29: 324 mg via ORAL
  Filled 2024-09-29: qty 4

## 2024-09-29 MED ORDER — MIDAZOLAM HCL (PF) 2 MG/2ML IJ SOLN
INTRAMUSCULAR | Status: DC | PRN
  Administered 2024-09-29: 2 mg via INTRAVENOUS

## 2024-09-29 MED ORDER — MORPHINE SULFATE (PF) 4 MG/ML IV SOLN
INTRAVENOUS | Status: AC
  Administered 2024-09-29: 4 mg via INTRAVENOUS
  Filled 2024-09-29: qty 1

## 2024-09-29 MED ORDER — SODIUM CHLORIDE 0.9 % IV SOLN: 250.0000 mL | INTRAVENOUS | Status: AC | PRN

## 2024-09-29 MED ORDER — LOSARTAN POTASSIUM 50 MG PO TABS
50.0000 mg | ORAL_TABLET | Freq: Every day | ORAL | Status: DC
  Administered 2024-09-29 – 2024-09-30 (×2): 50 mg via ORAL
  Filled 2024-09-29 (×2): qty 1

## 2024-09-29 MED ORDER — ACETAMINOPHEN 325 MG PO TABS: 650.0000 mg | ORAL_TABLET | Freq: Four times a day (QID) | ORAL | Status: DC | PRN

## 2024-09-29 MED ORDER — HEPARIN SODIUM (PORCINE) 5000 UNIT/ML IJ SOLN: 60.0000 [IU]/kg | Freq: Once | INTRAMUSCULAR | Status: DC

## 2024-09-29 MED ORDER — HEPARIN SODIUM (PORCINE) 1000 UNIT/ML IJ SOLN
INTRAMUSCULAR | Status: DC | PRN
  Administered 2024-09-29: 3000 [IU] via INTRAVENOUS

## 2024-09-29 MED ORDER — IOHEXOL 300 MG/ML  SOLN
INTRAMUSCULAR | Status: DC | PRN
  Administered 2024-09-29: 122 mL

## 2024-09-29 MED ORDER — ACETAMINOPHEN 325 MG PO TABS: 650.0000 mg | ORAL_TABLET | ORAL | Status: DC | PRN

## 2024-09-29 MED ORDER — SODIUM CHLORIDE 0.9 % IV SOLN: INTRAVENOUS | Status: DC

## 2024-09-29 MED ORDER — MORPHINE SULFATE (PF) 4 MG/ML IV SOLN: 4.0000 mg | Freq: Once | INTRAVENOUS | Status: AC

## 2024-09-29 MED ORDER — VERAPAMIL HCL 2.5 MG/ML IV SOLN
INTRAVENOUS | Status: DC | PRN
  Administered 2024-09-29: 2.5 mg via INTRAVENOUS

## 2024-09-29 MED ORDER — ACETAMINOPHEN 650 MG RE SUPP: 650.0000 mg | Freq: Four times a day (QID) | RECTAL | Status: DC | PRN

## 2024-09-29 MED ORDER — FENTANYL CITRATE (PF) 100 MCG/2ML IJ SOLN
INTRAMUSCULAR | Status: AC
  Filled 2024-09-29: qty 2

## 2024-09-29 MED ORDER — MORPHINE SULFATE (PF) 2 MG/ML IV SOLN: 2.0000 mg | INTRAVENOUS | Status: DC | PRN

## 2024-09-29 MED ORDER — POTASSIUM CHLORIDE CRYS ER 20 MEQ PO TBCR
20.0000 meq | EXTENDED_RELEASE_TABLET | Freq: Once | ORAL | Status: AC
  Administered 2024-09-29: 20 meq via ORAL
  Filled 2024-09-29: qty 1

## 2024-09-29 MED ORDER — POTASSIUM CHLORIDE 10 MEQ/100ML IV SOLN
10.0000 meq | INTRAVENOUS | Status: AC
  Administered 2024-09-29 (×3): 10 meq via INTRAVENOUS
  Filled 2024-09-29 (×3): qty 100

## 2024-09-29 NOTE — Consult Note (Signed)
 "  Cardiology Consultation   Patient ID: Alex [name] MRN: [id number]; DOB: 1961-09-03  Admit date: 09/29/2024 Date of Consult: 09/29/2024  PCP:  Jimmy Charlie FERNS, MD   [facility] Providers Cardiologist:  Dr. Florencio   Patient Profile: Alex [name] is a 64 y.o. male with a hx of coronary artery disease status post posterior STEMI and left circumflex PCI in 2022 who is being seen 09/29/2024 for the evaluation of chest pain and possible unstable angina at the request of Dr. Suzanne..  History of Present Illness: Mr. Jandreau is a 64 year old male with known history of coronary artery disease status post posterior STEMI in 2022 with stent placement to the left circumflex at that time, essential hypertension, chronic systolic heart failure, hyperlipidemia and GERD.  He presented with with acute onset of substernal chest pain and tightness very similar to his prior myocardial infarction.  This was associated with nausea and he vomited once.  He has been feeling well up until his presentation this morning and denies any recent viral illness.  His EKG in the ED showed minor ST depression in the precordial leads.  Given his symptoms and slight EKG changes, I recommended proceeding with emergent cardiac catheterization.   Past Medical History:  Diagnosis Date   HTN (hypertension)    OSA (obstructive sleep apnea)    PSG 09/23/10 AHI 22   RBBB (right bundle branch block)     Past Surgical History:  Procedure Laterality Date   APPENDECTOMY  1988   CORONARY/GRAFT ACUTE MI REVASCULARIZATION N/A 06/02/2021   Procedure: Coronary/Graft Acute MI Revascularization;  Surgeon: Florencio Cara BIRCH, MD;  Location: [facility] INVASIVE CV LAB;  Service: Cardiovascular;  Laterality: N/A;   fracture L arm     LEFT HEART CATH AND CORONARY ANGIOGRAPHY N/A 06/02/2021   Procedure: LEFT HEART CATH AND CORONARY ANGIOGRAPHY;  Surgeon: Florencio Cara BIRCH, MD;  Location: [facility] INVASIVE CV LAB;  Service:  Cardiovascular;  Laterality: N/A;   TONSILLECTOMY     VASECTOMY  1997      Home Medications:  Prior to Admission medications  Medication Sig Start Date End Date Taking? Authorizing Provider  aspirin  81 MG chewable tablet Chew 1 tablet (81 mg total) by mouth daily. 06/05/21   Cheryle Page, MD  atorvastatin  (LIPITOR ) 80 MG tablet Take 1 tablet (80 mg total) by mouth daily. 06/05/21   Cheryle Page, MD  DULoxetine  (CYMBALTA ) 60 MG capsule TAKE 1 CAPSULE BY MOUTH ONCE DAILY 11/26/23   Jimmy Charlie FERNS, MD  famotidine  (PEPCID ) 40 MG tablet Take 1 tablet (40 mg total) by mouth 2 (two) times daily. 06/11/24   Webb, Padonda B, FNP  losartan  (COZAAR ) 50 MG tablet Take 1 tablet (50 mg total) by mouth daily. 06/05/21   Cheryle Page, MD  metoprolol  tartrate (LOPRESSOR ) 25 MG tablet Take 1 tablet by mouth 2 (two) times daily. 11/26/23   [provider]  tadalafil  (CIALIS ) 10 MG tablet Take 1-2 tablets (10-20 mg total) by mouth daily as needed for erectile dysfunction (take 1 hour prior to sexual activity). 04/01/24   Francisca Redell BROCKS, MD    Scheduled Meds:  aspirin        [START ON 09/30/2024] aspirin   81 mg Oral Daily   atorvastatin   80 mg Oral Daily   Chlorhexidine  Gluconate Cloth  6 each Topical Daily   DULoxetine   60 mg Oral Daily   [START ON 09/30/2024] enoxaparin  (LOVENOX ) injection  40 mg Subcutaneous Q24H   famotidine   40 mg  Oral BID   losartan   50 mg Oral Daily   metoprolol  tartrate  25 mg Oral BID   potassium chloride   20 mEq Oral Once   sodium chloride  flush  3 mL Intravenous Q12H   Continuous Infusions:  sodium chloride      PRN Meds: sodium chloride , acetaminophen , aspirin , nitroGLYCERIN , ondansetron  (ZOFRAN ) IV, sodium chloride  flush  Allergies:   Allergies[1]  Social History:   Social History   Socioeconomic History   Marital status: Married    Spouse name: Not on file   Number of children: 2   Years of education: Not on file   Highest education level: Not on file   Occupational History   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   [employer].        Social Drivers of Health   Tobacco Use: Medium Risk (09/29/2024)   Patient History    Smoking Tobacco Use: Never    Smokeless Tobacco Use: Former    Passive Exposure: Past  Programmer, Applications: Not on [employer] Insecurity: Not on file  Transportation Needs: Not on file  Physical Activity: Not on file  Stress: Not on file  Social Connections: Not on file  Intimate Partner Violence: Not on file  Depression (PHQ2-9): Low Risk (04/23/2023)   Depression (PHQ2-9)    PHQ-2 Score: 1  Alcohol Screen: Not on file  Housing: Unknown (08/01/2024)   [facility]   Epic    Unable to Pay for Housing in the Last Year: Not on file    Number of Times Moved in the Last Year: Not on file    At any time in the past 12 months, were you homeless or living in a shelter (including now)?: No  Utilities: Not on file  Health Literacy: Not on file    Family History:    Family History  Problem Relation Age of Onset   Stroke Father 26       doing okay   Skin cancer Mother    Stroke Paternal Grandfather        and PGGF   Hypertension Other        dad's side    Diabetes Neg Hx        DM    Coronary artery disease Neg Hx    Prostate cancer Neg Hx        no colon cancer    Colon cancer Neg Hx      ROS:  Please see the history of present illness.   All other ROS reviewed and negative.     Physical  Exam/Data: Vitals:   09/29/24 1114 09/29/24 1119 09/29/24 1124 09/29/24 1129  BP: (!) 177/109 (!) 147/106 (!) 161/104 (!) 159/101  Pulse: 77 72 69 68  Resp: (!) 22 16 17  (!) 21  Temp:      TempSrc:  SpO2: 98% 94% 99% 99%  Weight:      Height:        Intake/Output Summary (Last 24 hours) at 09/29/2024 1212 Last data filed at 09/29/2024 1200 Gross per 24 hour  Intake --  Output 400 ml  Net -400 ml      09/29/2024   10:34 AM 04/01/2024    8:53 AM 12/12/2023   11:25 AM  Last 3 Weights  Weight (lbs) 208 lb 5.4 oz 205 lb 11.2 oz 216 lb 3.2 oz  Weight (kg) 94.5 kg 93.305 kg 98.068 kg     Body mass index is 27.49 kg/m.  General:  Well nourished, well developed, in no acute distress HEENT: normal Neck: no JVD Vascular: No carotid bruits; Distal pulses 2+ bilaterally Cardiac:  normal S1, S2; RRR; no murmur  Lungs:  clear to auscultation bilaterally, no wheezing, rhonchi or rales  Abd: soft, nontender, no hepatomegaly  Ext: no edema Musculoskeletal:  No deformities, BUE and BLE strength normal and equal Skin: warm and dry  Neuro:  CNs 2-12 intact, no focal abnormalities noted Psych:  Normal affect   EKG:  The EKG was personally reviewed and demonstrates: Sinus rhythm with right bundle branch block and minor ST depression in the precordial leads Telemetry:  Telemetry was personally reviewed and demonstrates:    Relevant CV Studies:   Laboratory Data: High Sensitivity Troponin:  No results for input(s): TROPONINIHS in the last 720 hours.  Recent Labs  Lab 09/29/24 0955  TRNPT <15      Chemistry Recent Labs  Lab 09/29/24 0955  NA 140  K 3.1*  CL 102  CO2 17*  GLUCOSE 164*  BUN 21  CREATININE 1.21  CALCIUM  9.9  GFRNONAA >60  ANIONGAP 22*    No results for input(s): PROT, ALBUMIN, AST, ALT, ALKPHOS, BILITOT in the last 168 hours. Lipids No results for input(s): CHOL, TRIG, HDL, LABVLDL, LDLCALC, CHOLHDL in the last 168 hours.   Hematology Recent Labs  Lab 09/29/24 0955  WBC 17.6*  RBC 5.14  HGB 14.8  HCT 43.6  MCV 84.8  MCH 28.8  MCHC 33.9  RDW 12.3  PLT 230   Thyroid No results for input(s): TSH, FREET4 in the last 168 hours.  BNPNo results for input(s): BNP, PROBNP in the last 168 hours.  DDimer No results for input(s): DDIMER in the last 168 hours.  Radiology/Studies:  CARDIAC CATHETERIZATION Result Date: 09/29/2024   Prox LAD lesion is 75% stenosed.   Mid LAD lesion is 80% stenosed.   Dist LAD lesion is 25% stenosed.   Dist Cx lesion is 70% stenosed.   2nd Diag lesion is 70% stenosed.   Non-stenotic Prox Cx to Mid Cx lesion was previously treated.   There is mild to moderate left ventricular systolic dysfunction.   LV end diastolic pressure is severely elevated.   The left ventricular ejection fraction is 35-45% by visual estimate. 1.  Patent left circumflex stent with no significant restenosis.  No clear culprit is identified for possible posterior STEMI.  The patient does have chronic disease involving the LAD and distal left circumflex but this does not appear to be different from before. 2.  Mildly to moderately reduced LV systolic function with an EF of 40% with severely elevated left ventricular end-diastolic pressure at 34 mmHg. 3.  Ascending aortogram showed no evidence of aortic dissection or significant aneurysm Recommendations: No clear culprit is identified for the patient's presentation.  The patient was chest pain-free at the  end of the case. Replace potassium and diurese as needed.  I ordered an echocardiogram.  Continue medical therapy for coronary artery disease.   DG Chest Portable 1 View Result Date: 09/29/2024 CLINICAL DATA:  Chest pain. EXAM: PORTABLE CHEST 1 VIEW COMPARISON:  June 02, 2021 FINDINGS: The heart size and mediastinal contours are within normal limits. Both lungs are clear. The visualized skeletal structures are unremarkable. IMPRESSION: No active disease.  Electronically Signed   By: Suzen Dials M.D.   On: 09/29/2024 11:14     Assessment and Plan: Unstable angina with possible posterior STEMI: Given the patient's persistent chest pain and previous left circumflex stent, I recommended proceeding with emergent cardiac catheterization which was performed via the right radial artery.  This showed widely patent left circumflex stent with chronic disease involving the LAD and distal left circumflex.  I did not see a culprit for the patient's presentation.  He does have significant stenosis in the proximal-mid LAD but he had no anterior ST elevation on his EKG.  Will treat medically for now and will cycle troponin.  If subsequent troponin is elevated, recommend starting heparin  drip. Acute on chronic systolic heart failure: LVEDP was significantly elevated at 34 mmHg.  However, the patient was not orthopneic.  I did not diurese him given that he is hypokalemic but he might require intravenous diuresis.  I requested an echocardiogram.  He is on losartan  and metoprolol . Hyperlipidemia: Continue high-dose atorvastatin  Elevated anion gap: Unclear etiology.  Will check lactic acid.  The patient is followed by Dr. Florencio and his team will follow the patient inpatient.  For questions or updates, please contact [facility] Please consult [email or web address] for contact info under      Signed, Deatrice Cage, MD  09/29/2024 12:12 PM     [1] No Known Allergies  "

## 2024-09-29 NOTE — ED Notes (Addendum)
 MD ARIDA to bedside

## 2024-09-29 NOTE — H&P (Signed)
 "  History and Physical    Alex [name] FMW:[id number] DOB: 10/06/1960 DOA: 09/29/2024  DOS: the patient was seen and examined on 09/29/2024  PCP: Jimmy Charlie FERNS, MD   Patient coming from: Home  I have personally briefly reviewed patient's old medical records in [facility]  Chief Complaint: Chest pain started this morning  HPI: Alex [name] is a pleasant 64 y.o. male with medical history significant for CAD s/p STEMI in 2022 with stent placement to the left circumflex at that time, HTN, HFrEF, HLD, GERD who presented to ED at [facility] with sudden onset of acute substernal left-sided chest pain and tightness very similar to his prior myocardial infarction started this morning when he woke up.  Patient stated chest pain as 10/10 intensity mostly in the epigastric area, sharp, pressure-like, nonradiating.  Patient denied any fever, chills, cough, palpitations.  Denied any chest trauma.  ED Course: Upon arrival to the ED, patient is found to be in ST depression in precordial leads.  Code STEMI was called, patient was taken to Cath Lab.  Given his symptoms and EKG changes, cardiology recommended for emergent cardiac catheterization.  Cardiac catheterization revealed no culprit lesion. Dr Darron advised medical management.  Hospital service was consulted for evaluation for admission.  Review of Systems:  ROS  All other systems negative except as noted in the HPI.  Past Medical History:  Diagnosis Date   HTN (hypertension)    OSA (obstructive sleep apnea)    PSG 09/23/10 AHI 22   RBBB (right bundle branch block)     Past Surgical History:  Procedure Laterality Date   APPENDECTOMY  1988   CORONARY/GRAFT ACUTE MI REVASCULARIZATION N/A 06/02/2021   Procedure: Coronary/Graft Acute MI Revascularization;  Surgeon: Florencio Cara BIRCH, MD;  Location: [facility] INVASIVE CV LAB;  Service: Cardiovascular;  Laterality: N/A;   fracture L arm     LEFT HEART CATH AND CORONARY ANGIOGRAPHY N/A 06/02/2021    Procedure: LEFT HEART CATH AND CORONARY ANGIOGRAPHY;  Surgeon: Florencio Cara BIRCH, MD;  Location: [facility] INVASIVE CV LAB;  Service: Cardiovascular;  Laterality: N/A;   TONSILLECTOMY     VASECTOMY  1997      reports that he has never smoked. He has been exposed to tobacco smoke. He quit smokeless tobacco use about 8 years ago.  His smokeless tobacco use included chew. He reports current alcohol use. He reports current drug use. Drug: Marijuana.  Allergies[1]  Family History  Problem Relation Age of Onset   Stroke Father 72       doing okay   Skin cancer Mother    Stroke Paternal Grandfather        and PGGF   Hypertension Other        dad's side    Diabetes Neg Hx        DM    Coronary artery disease Neg Hx    Prostate cancer Neg Hx        no colon cancer    Colon cancer Neg Hx     Prior to Admission medications  Medication Sig Start Date End Date Taking? Authorizing Provider  aspirin  81 MG chewable tablet Chew 1 tablet (81 mg total) by mouth daily. 06/05/21   Cheryle Page, MD  atorvastatin  (LIPITOR ) 80 MG tablet Take 1 tablet (80 mg total) by mouth daily. 06/05/21   Cheryle Page, MD  DULoxetine  (CYMBALTA ) 60 MG capsule TAKE 1 CAPSULE BY MOUTH ONCE DAILY 11/26/23   Jimmy Charlie FERNS,  MD  famotidine  (PEPCID ) 40 MG tablet Take 1 tablet (40 mg total) by mouth 2 (two) times daily. 06/11/24   Webb, Padonda B, FNP  losartan  (COZAAR ) 50 MG tablet Take 1 tablet (50 mg total) by mouth daily. 06/05/21   Cheryle Page, MD  metoprolol  tartrate (LOPRESSOR ) 25 MG tablet Take 1 tablet by mouth 2 (two) times daily. 11/26/23   [provider]  tadalafil  (CIALIS ) 10 MG tablet Take 1-2 tablets (10-20 mg total) by mouth daily as needed for erectile dysfunction (take 1 hour prior to sexual activity). 04/01/24   Francisca Redell BROCKS, MD    Physical Exam: Vitals:   09/29/24 1129 09/29/24 1150 09/29/24 1200 09/29/24 1300  BP: (!) 159/101 (!) 166/113 (!) 162/116 (!) 167/115  Pulse: 68 62 61 60  Resp:  (!) 21 18 (!) 22 19  Temp:  97.7 F (36.5 C)    TempSrc:  Oral    SpO2: 99% 94% 97% 96%  Weight:  92.3 kg    Height:  5' 11 (1.803 m)      Physical Exam   Constitutional: Alert, awake, calm, comfortable HEENT: Neck supple Respiratory: Clear to auscultation B/L, no wheezing, no rales.  Cardiovascular: Regular rate and rhythm, no murmurs / rubs / gallops. No extremity edema. 2+ pedal pulses. No carotid bruits.  Abdomen: Soft, no tenderness, Bowel sounds positive.  Musculoskeletal: no clubbing / cyanosis. Good ROM, no contractures. Normal muscle tone.  Skin: no rashes, lesions, ulcers. Neurologic: CN 2-12 grossly intact. Sensation intact, No focal deficit identified Psychiatric: Alert and oriented x 3. Normal mood.    Labs on Admission: I have personally reviewed following labs and imaging studies  CBC: Recent Labs  Lab 09/29/24 0955 09/29/24 1210  WBC 17.6* 11.0*  HGB 14.8 14.1  HCT 43.6 42.2  MCV 84.8 84.4  PLT 230 175   Basic Metabolic Panel: Recent Labs  Lab 09/29/24 0955 09/29/24 1210  NA 140 139  K 3.1* 3.4*  CL 102 104  CO2 17* 24  GLUCOSE 164* 120*  BUN 21 19  CREATININE 1.21 1.03  CALCIUM  9.9 9.3   GFR: Estimated Creatinine Clearance: 86.4 mL/min (by C-G formula based on SCr of 1.03 mg/dL). Liver Function Tests: Recent Labs  Lab 09/29/24 1210  AST 22  ALT 26  ALKPHOS 131*  BILITOT 1.9*  PROT 7.0  ALBUMIN 4.5   No results for input(s): LIPASE, AMYLASE in the last 168 hours. No results for input(s): AMMONIA in the last 168 hours. Coagulation Profile: Recent Labs  Lab 09/29/24 1210  INR 1.2   Cardiac Enzymes: No results for input(s): CKTOTAL, CKMB, CKMBINDEX, TROPONINI, TROPONINIHS in the last 168 hours. BNP (last 3 results) No results for input(s): BNP in the last 8760 hours. HbA1C: No results for input(s): HGBA1C in the last 72 hours. CBG: Recent Labs  Lab 09/29/24 1147  GLUCAP 143*   Lipid Profile: Recent  Labs    09/29/24 1210  CHOL 102  HDL 46  LDLCALC 52  TRIG 24  CHOLHDL 2.2   Thyroid Function Tests: No results for input(s): TSH, T4TOTAL, FREET4, T3FREE, THYROIDAB in the last 72 hours. Anemia Panel: No results for input(s): VITAMINB12, FOLATE, FERRITIN, TIBC, IRON, RETICCTPCT in the last 72 hours. Urine analysis: No results found for: COLORURINE, APPEARANCEUR, LABSPEC, PHURINE, GLUCOSEU, HGBUR, BILIRUBINUR, KETONESUR, PROTEINUR, UROBILINOGEN, NITRITE, LEUKOCYTESUR  Radiological Exams on Admission: I have personally reviewed images CARDIAC CATHETERIZATION Result Date: 09/29/2024   Prox LAD lesion is 75% stenosed.   Mid  LAD lesion is 80% stenosed.   Dist LAD lesion is 25% stenosed.   Dist Cx lesion is 70% stenosed.   2nd Diag lesion is 70% stenosed.   Non-stenotic Prox Cx to Mid Cx lesion was previously treated.   There is mild to moderate left ventricular systolic dysfunction.   LV end diastolic pressure is severely elevated.   The left ventricular ejection fraction is 35-45% by visual estimate. 1.  Patent left circumflex stent with no significant restenosis.  No clear culprit is identified for possible posterior STEMI.  The patient does have chronic disease involving the LAD and distal left circumflex but this does not appear to be different from before. 2.  Mildly to moderately reduced LV systolic function with an EF of 40% with severely elevated left ventricular end-diastolic pressure at 34 mmHg. 3.  Ascending aortogram showed no evidence of aortic dissection or significant aneurysm Recommendations: No clear culprit is identified for the patient's presentation.  The patient was chest pain-free at the end of the case. Replace potassium and diurese as needed.  I ordered an echocardiogram.  Continue medical therapy for coronary artery disease.   DG Chest Portable 1 View Result Date: 09/29/2024 CLINICAL DATA:  Chest pain. EXAM: PORTABLE CHEST 1 VIEW  COMPARISON:  June 02, 2021 FINDINGS: The heart size and mediastinal contours are within normal limits. Both lungs are clear. The visualized skeletal structures are unremarkable. IMPRESSION: No active disease. Electronically Signed   By: Suzen Dials M.D.   On: 09/29/2024 11:14    EKG: My personal interpretation of EKG shows: ST depression in precordial leads    Assessment/Plan Principal Problem:   Unstable angina (HCC) Active Problems:   Essential hypertension, benign   OSA (obstructive sleep apnea)   Gastroesophageal reflux disease   Coronary atherosclerosis of native coronary artery    Assessment and Plan: 64 year old male with history of coronary artery disease s/p MI and PCI in 2022, HTN, HLD, OSA, and GERD who came into ED for chest pain and elevated troponin.  Patient underwent cardiac catheterization without finding of new culprit lesion.  1.  Chest pain with elevated troponin in the setting of coronary artery disease - Patient underwent cardiac catheterization with no significant finding - Cardiology advised for medical management. - Will continue patient's home medications aspirin , statin, metoprolol  and lisinopril - Will follow final recommendation from cardiologist.  2.  Initial leukocytosis - Leukocytes went down to 11 - Patient denies any fever, cough, dysuria, hematuria. - Chest x-ray did not reveal any signs of infection - No antibiotics at this point - Continue to monitor symptoms  3.  HTN - Resume his home medications metoprolol  at 25 mg twice daily and losartan  50 mg daily - Continue monitor blood pressure  4.  GERD - Continue his home medications Pepcid  twice daily  5.  OSA - CPAP during the night  6.  Depression - Continue Cymbalta  at home dose 60 mg daily     DVT prophylaxis: Lovenox  Code Status: Full Code Family Communication: Family member was at bedside Disposition Plan: Home Consults called: Cardiology Admission status: IP, Step  Down Unit   Nena Rebel, MD [facility] 09/29/2024, 1:36 PM        [1] No Known Allergies  "

## 2024-09-29 NOTE — ED Provider Notes (Signed)
 "  [facility] Provider Note    None    (approximate)   History   Chest Pain   HPI  Alex [name] is a 64 y.o. male past medical history significant for prior CAD with prior PCI in September 2022, history of hypertension, hyperlipidemia, CHF, GERD, who presents to the emergency department chest pain.  Patient states that he woke up this morning with significant chest pain.  States that he has epigastric pain that radiates to his chest.  Associated with nausea.  States that it feels 10 out of 10 and feels similar to prior episodes of heart attack.  1 heart attack multiple years ago.  States that he is supposed to wear oxygen at home but has not been wearing it.  Denies any falls or trauma.  States that he has a pressure pain and then intermittent sharp stabbing pains.  Denies it radiating to his back.  Does endorse nausea and vomiting.  No falls or trauma.  Denies cocaine use.  On chart review followed by Dr. Florencio with cardiology     Physical Exam   Triage Vital Signs: ED Triage Vitals  Encounter Vitals Group     BP      Girls Systolic BP Percentile      Girls Diastolic BP Percentile      Boys Systolic BP Percentile      Boys Diastolic BP Percentile      Pulse      Resp      Temp      Temp src      SpO2      Weight      Height      Head Circumference      Peak Flow      Pain Score      Pain Loc      Pain Education      Exclude from Growth Chart     Most recent vital signs: Vitals:   09/29/24 1012  BP: (!) 173/137  Pulse: 73  Resp: (!) 27  Temp: (!) 97.5 F (36.4 C)  SpO2: 100%    Physical Exam Constitutional:      General: He is in acute distress.     Appearance: He is well-developed.  HENT:     Head: Atraumatic.  Eyes:     Conjunctiva/sclera: Conjunctivae normal.  Cardiovascular:     Rate and Rhythm: Regular rhythm.     Pulses:          Radial pulses are 2+ on the right side and 2+ on the left side.       Dorsalis pedis  pulses are 2+ on the right side and 2+ on the left side.     Heart sounds: Normal heart sounds.  Pulmonary:     Effort: No respiratory distress.     Breath sounds: No wheezing.  Musculoskeletal:        General: Normal range of motion.     Cervical back: Normal range of motion.     Right lower leg: No edema.     Left lower leg: No edema.  Skin:    General: Skin is warm.     Capillary Refill: Capillary refill takes less than 2 seconds.  Neurological:     General: No focal deficit present.     Mental Status: He is alert. Mental status is at baseline.     IMPRESSION / MDM / ASSESSMENT AND PLAN / ED COURSE  I  reviewed the triage vital signs and the nursing notes.  Differential diagnosis including ACS, dissection, pneumonia, perforated peptic ulcer disease, pneumonia, pneumothorax, pulmonary embolism  Patient made n.p.o. and placed on cardiac telemetry   EKG  I, Clotilda Punter, the attending physician, personally viewed and interpreted this ECG.  Initial EKG with significant artifact, signs of LVH.  Questionable ST depression to the lateral leads and ST depression to V3.  Repeating EKG and asking for the patient to be taken to a room.  Repeat EKG with significant artifact.  No significant ST elevation to the lateral leads on repeat EKG.  Does have some ST depression to V2 and V3.  No tachycardic or bradycardic dysrhythmias while on cardiac telemetry.  RADIOLOGY I independently reviewed imaging, my interpretation of imaging: Chest x-ray with no widened mediastinum.  No pneumothorax.  No ender under the diaphragm.  No obvious pulmonary edema or pneumonia.  LABS (all labs ordered are listed, but only abnormal results are displayed) Labs interpreted as -    Labs Reviewed  CBC - Abnormal; Notable for the following components:      Result Value   WBC 17.6 (*)    All other components within normal limits  BASIC METABOLIC PANEL WITH GFR  HEMOGLOBIN A1C  PROTIME-INR  APTT   COMPREHENSIVE METABOLIC PANEL WITH GFR  LIPID PANEL  LACTIC ACID, PLASMA  TROPONIN T, HIGH SENSITIVITY  TROPONIN T, HIGH SENSITIVITY     MDM   Clinical Course as of 09/29/24 1031  Mon Sep 29, 2024  0956 Handed an EKG with ST changes to the lateral leads.  Asking for the patient to be roomed and repeating the EKG. [SM]  1015 Repeat EKG does not meet STEMI criteria, does have findings consistent with LVH, questionable ST depression to V3.  Significant artifact.  Does not meet STEMI criteria.   Repeating EKG at this time. [SM]  1021 Patient continues to have severe chest pain.  Repeating a third EKG.  Less artifact.  Does have ST elevation to V6 but I do not see ST elevation to multiple leads, ST depression to V3 and V2.  Ongoing severe chest pain.  Consulting STEMI cardiology physician. [SM]  1021 Ongoing chest pain with vomiting, given IV Zofran , morphine , nitroglycerin  [SM]  1030 Patient with ongoing chest pain.  Discussed with Dr. Cloria who is on-call for code STEMI, reviewed EKGs, patient with concern for posterior STEMI versus acute coronary occlusion.  Recommended 4000 units of heparin  and activating the Cath Lab.  Code STEMI activated.  Given 4000 units of heparin .  Plan to go immediately to the Cath Lab. [SM]    Clinical Course User Index [SM] Punter Clotilda, MD     PROCEDURES:  Critical Care performed: yes  .Critical Care  Performed by: Punter Clotilda, MD Authorized by: Punter Clotilda, MD   Critical care provider statement:    Critical care time (minutes):  30   Critical care was necessary to treat or prevent imminent or life-threatening deterioration of the following conditions:  Cardiac failure   Critical care was time spent personally by me on the following activities:  Development of treatment plan with patient or surrogate, discussions with consultants, evaluation of patient's response to treatment, examination of patient, ordering and review of laboratory studies,  ordering and review of radiographic studies, ordering and performing treatments and interventions, pulse oximetry, re-evaluation of patient's condition and review of old charts   Patient's presentation is most consistent with acute presentation with potential threat to life  or bodily function.   MEDICATIONS ORDERED IN ED: Medications  nitroGLYCERIN  (NITROSTAT ) SL tablet 0.4 mg (0.4 mg Sublingual Given 09/29/24 1028)  aspirin  325 MG tablet (  Not Given 09/29/24 1018)  0.9 %  sodium chloride  infusion (has no administration in time range)  heparin  injection 4,000 Units (has no administration in time range)  aspirin  chewable tablet 324 mg (324 mg Oral Given 09/29/24 1009)  ondansetron  (ZOFRAN ) injection 4 mg (4 mg Intravenous Given 09/29/24 1013)  morphine  (PF) 4 MG/ML injection 4 mg (4 mg Intravenous Given 09/29/24 1025)    FINAL CLINICAL IMPRESSION(S) / ED DIAGNOSES   Final diagnoses:  Chest pain, unspecified type  ST elevation myocardial infarction (STEMI), unspecified artery (HCC)     Rx / DC Orders   ED Discharge Orders     None        Note:  This document was prepared using Dragon voice recognition software and may include unintentional dictation errors.   Suzanne Kirsch, MD 09/29/24 1031  "

## 2024-09-29 NOTE — ED Notes (Signed)
Code  stemi  called  to  [facility] 

## 2024-09-29 NOTE — ED Triage Notes (Signed)
 Pt come via EMS from home due to onset of chest pain. Pt has history of heart attack a couple years back. Pt wears oxygen at home. Pt complaining of 10/10 pain in epigastric area. MD Mumma at bedside.

## 2024-09-29 NOTE — Progress Notes (Signed)
" °   09/29/24 1035  Spiritual Encounters  Type of Visit Initial  Care provided to: Family (Brother in Development Worker, Community at bedside)  [place] partners present during encounter Nurse;Physician  Referral source Code page  Reason for visit Code  OnCall Visit Yes  Interventions  Spiritual Care Interventions Made Established relationship of care and support;Compassionate presence  Intervention Outcomes  Outcomes Connection to spiritual care;Awareness of support  Spiritual Care Plan  Spiritual Care Issues Still Outstanding No further spiritual care needs at this time (see row info)   Chaplain took brother in law [place] to cath lab waiting room and made sure that doctor knew he was waiting.  "

## 2024-09-30 ENCOUNTER — Other Ambulatory Visit: Payer: Self-pay | Admitting: Family

## 2024-09-30 DIAGNOSIS — I200 Unstable angina: Secondary | ICD-10-CM | POA: Diagnosis not present

## 2024-09-30 LAB — ECHOCARDIOGRAM COMPLETE
AR max vel: 3.37 cm2
AV Area VTI: 3.05 cm2
AV Area mean vel: 2.96 cm2
AV Mean grad: 4 mmHg
AV Peak grad: 6.8 mmHg
Ao pk vel: 1.3 m/s
Area-P 1/2: 4.31 cm2
Calc EF: 40.3 %
Height: 71 in
MV M vel: 5.82 m/s
MV Peak grad: 135.3 mmHg
MV VTI: 2.19 cm2
Radius: 0.75 cm
S' Lateral: 4.6 cm
Single Plane A2C EF: 33 %
Single Plane A4C EF: 50.6 %
Weight: 3255.75 [oz_av]

## 2024-09-30 LAB — COMPREHENSIVE METABOLIC PANEL WITH GFR
ALT: 21 U/L (ref 0–44)
AST: 19 U/L (ref 15–41)
Albumin: 4.3 g/dL (ref 3.5–5.0)
Alkaline Phosphatase: 119 U/L (ref 38–126)
Anion gap: 12 (ref 5–15)
BUN: 15 mg/dL (ref 8–23)
CO2: 23 mmol/L (ref 22–32)
Calcium: 9.3 mg/dL (ref 8.9–10.3)
Chloride: 103 mmol/L (ref 98–111)
Creatinine, Ser: 1.04 mg/dL (ref 0.61–1.24)
GFR, Estimated: >60 mL/min
Glucose, Bld: 117 mg/dL — ABNORMAL HIGH (ref 70–99)
Potassium: 3.3 mmol/L — ABNORMAL LOW (ref 3.5–5.1)
Sodium: 138 mmol/L (ref 135–145)
Total Bilirubin: 2.1 mg/dL — ABNORMAL HIGH (ref 0.0–1.2)
Total Protein: 6.7 g/dL (ref 6.5–8.1)

## 2024-09-30 LAB — CBC
HCT: 42.2 % (ref 39.0–52.0)
Hemoglobin: 14 g/dL (ref 13.0–17.0)
MCH: 28.7 pg (ref 26.0–34.0)
MCHC: 33.2 g/dL (ref 30.0–36.0)
MCV: 86.7 fL (ref 80.0–100.0)
Platelets: 197 K/uL (ref 150–400)
RBC: 4.87 MIL/uL (ref 4.22–5.81)
RDW: 12.4 % (ref 11.5–15.5)
WBC: 10.8 K/uL — ABNORMAL HIGH (ref 4.0–10.5)
nRBC: 0 % (ref 0.0–0.2)

## 2024-09-30 LAB — PROTIME-INR
INR: 1 (ref 0.8–1.2)
Prothrombin Time: 14.3 s (ref 11.4–15.2)

## 2024-09-30 LAB — D-DIMER, QUANTITATIVE: D-Dimer, Quant: <0.27 ug{FEU}/mL (ref 0.00–0.50)

## 2024-09-30 LAB — HIV ANTIBODY (ROUTINE TESTING W REFLEX): HIV Screen 4th Generation wRfx: NONREACTIVE

## 2024-09-30 MED ORDER — SPIRONOLACTONE 12.5 MG HALF TABLET
12.5000 mg | ORAL_TABLET | Freq: Every day | ORAL | Status: DC
  Administered 2024-09-30: 12.5 mg via ORAL
  Filled 2024-09-30: qty 1

## 2024-09-30 MED ORDER — POTASSIUM CHLORIDE CRYS ER 20 MEQ PO TBCR
40.0000 meq | EXTENDED_RELEASE_TABLET | Freq: Two times a day (BID) | ORAL | Status: DC
  Administered 2024-09-30: 40 meq via ORAL
  Filled 2024-09-30: qty 2

## 2024-09-30 MED ORDER — ISOSORBIDE MONONITRATE ER 30 MG PO TB24
30.0000 mg | ORAL_TABLET | Freq: Every day | ORAL | Status: DC
  Administered 2024-09-30: 30 mg via ORAL
  Filled 2024-09-30: qty 1

## 2024-09-30 MED ORDER — METOPROLOL SUCCINATE ER 50 MG PO TB24
25.0000 mg | ORAL_TABLET | Freq: Two times a day (BID) | ORAL | Status: DC
  Administered 2024-09-30: 25 mg via ORAL
  Filled 2024-09-30: qty 1

## 2024-09-30 MED ORDER — ISOSORBIDE MONONITRATE ER 30 MG PO TB24: 30.0000 mg | ORAL_TABLET | Freq: Every day | ORAL | 1 refills | Status: AC

## 2024-09-30 MED ORDER — METOPROLOL SUCCINATE ER 25 MG PO TB24: 25.0000 mg | ORAL_TABLET | Freq: Two times a day (BID) | ORAL | 1 refills | Status: AC

## 2024-09-30 MED ORDER — SPIRONOLACTONE 25 MG PO TABS: 12.5000 mg | ORAL_TABLET | Freq: Every day | ORAL | 1 refills | Status: AC

## 2024-09-30 NOTE — Progress Notes (Signed)
 [facility] CLINIC CARDIOLOGY PROGRESS NOTE   Patient ID: Alex [name] MRN: [id number] DOB/AGE: 04/17/1961 64 y.o.  Admit date: 09/29/2024 Referring Physician Dr. Darron Primary Physician Jimmy Charlie FERNS, MD  Primary Cardiologist Dr. Florencio Reason for Consultation unstable angina  HPI: Alex [name] is a 64 y.o. male with a past medical history of CAD with STEMI in 2022, hypertension, hyperlipidemia, GERD, OSA who presented to the ED on 09/29/2024 for chest pain. Had continued severe pain similar to prior STEMI and thus was taken to cath lab emergently. Cardiology was consulted for further evaluation.   Interval History:  -Patient seen and examined this AM, resting in bed.  -States he is feeling well today with no recurrence of CP.  -BP and HR stable, no events on tele.   Review of systems complete and found to be negative unless listed above   Vitals:   09/30/24 0400 09/30/24 0500 09/30/24 0600 09/30/24 0628  BP: (!) 159/121 (!) 156/104 (!) 169/111 (!) 164/109  Pulse: 63 (!) 59 63 61  Resp: 18 14 13 14   Temp:      TempSrc:      SpO2: 94% 94% 95% 94%  Weight:      Height:         Intake/Output Summary (Last 24 hours) at 09/30/2024 0851 Last data filed at 09/30/2024 0300 Gross per 24 hour  Intake 420 ml  Output 1175 ml  Net -755 ml     PHYSICAL EXAM General: Well appearing male, well nourished, in no acute distress. HEENT: Normocephalic and atraumatic. Neck: No JVD.  Lungs: Normal respiratory effort on room air. Clear bilaterally to auscultation. No wheezes, crackles, rhonchi.  Heart: HRRR. Normal S1 and S2 without gallops or murmurs. Radial & DP pulses 2+ bilaterally. Abdomen: Non-distended appearing.  Msk: Normal strength and tone for age. Extremities: No clubbing, cyanosis or edema.   Neuro: Alert and oriented X 3. Psych: Mood appropriate, affect congruent.    LABS: Basic Metabolic Panel: Recent Labs    09/29/24 1210 09/30/24 0330  NA 139 138  K 3.4*  3.3*  CL 104 103  CO2 24 23  GLUCOSE 120* 117*  BUN 19 15  CREATININE 1.03 1.04  CALCIUM  9.3 9.3   Liver Function Tests: Recent Labs    09/29/24 1210 09/30/24 0330  AST 22 19  ALT 26 21  ALKPHOS 131* 119  BILITOT 1.9* 2.1*  PROT 7.0 6.7  ALBUMIN 4.5 4.3   No results for input(s): LIPASE, AMYLASE in the last 72 hours. CBC: Recent Labs    09/29/24 1210 09/30/24 0330  WBC 11.0* 10.8*  HGB 14.1 14.0  HCT 42.2 42.2  MCV 84.4 86.7  PLT 175 197   Cardiac Enzymes: No results for input(s): CKTOTAL, CKMB, CKMBINDEX, TROPONINIHS in the last 72 hours. BNP: No results for input(s): BNP in the last 72 hours. D-Dimer: No results for input(s): DDIMER in the last 72 hours. Hemoglobin A1C: Recent Labs    09/29/24 1210  HGBA1C 5.7*   Fasting Lipid Panel: Recent Labs    09/29/24 1210  CHOL 102  HDL 46  LDLCALC 52  TRIG 24  CHOLHDL 2.2   Thyroid Function Tests: No results for input(s): TSH, T4TOTAL, T3FREE, THYROIDAB in the last 72 hours.  Invalid input(s): FREET3 Anemia Panel: No results for input(s): VITAMINB12, FOLATE, FERRITIN, TIBC, IRON, RETICCTPCT in the last 72 hours.  ECHOCARDIOGRAM COMPLETE Result Date: 09/30/2024    ECHOCARDIOGRAM REPORT   Patient Name:   Alex [name]  Quarry Date of Exam: 09/29/2024 Medical Rec #:  [id number]        Height:       71.0 in Accession #:    [id number]       Weight:       203.5 lb Date of Birth:  08/21/1961       BSA:          2.124 m Patient Age:    63 years         BP:           162/116 mmHg Patient Gender: M                HR:           62 bpm. Exam Location:  [facility] Procedure: 2D Echo, Cardiac Doppler and Color Doppler (Both Spectral and Color            Flow Doppler were utilized during procedure). Indications:     Chest Pain R07.9  History:         Patient has prior history of Echocardiogram examinations, most                  recent 06/03/2021. CAD, Abnormal ECG; Signs/Symptoms:Chest Pain.   Sonographer:     Ashley McNeely-Sloane Referring Phys:  [id number] FLYJFFJI A ARIDA Diagnosing Phys: Lonni Hanson MD IMPRESSIONS  1. Left ventricular ejection fraction, by estimation, is 45 to 50%. The left ventricle has mildly decreased function. The left ventricle demonstrates regional wall motion abnormalities (see scoring diagram/findings for description). There is moderate asymmetric left ventricular hypertrophy of the basal-septal segment. Left ventricular diastolic parameters are consistent with Grade III diastolic dysfunction (restrictive). Elevated left atrial pressure. There is severe hypokinesis of the left ventricular, mid inferolateral wall.  2. Right ventricular systolic function is normal. The right ventricular size is mildly enlarged. Mildly increased right ventricular wall thickness. There is severely elevated pulmonary artery systolic pressure. The estimated right ventricular systolic pressure is 64.2 mmHg.  3. Left atrial size was mildly dilated.  4. Right atrial size was mildly dilated.  5. The mitral valve is degenerative. Moderate mitral valve regurgitation. No evidence of mitral stenosis.  6. The aortic valve is tricuspid. There is mild thickening of the aortic valve. Aortic valve regurgitation is trivial. Aortic valve sclerosis is present, with no evidence of aortic valve stenosis.  7. The inferior vena cava is normal in size with <50% respiratory variability, suggesting right atrial pressure of 8 mmHg.  8. Cannot exclude a small PFO. FINDINGS  Left Ventricle: Left ventricular ejection fraction, by estimation, is 45 to 50%. The left ventricle has mildly decreased function. The left ventricle demonstrates regional wall motion abnormalities. Severe hypokinesis of the left ventricular, mid inferolateral wall. The left ventricular internal cavity size was normal in size. There is moderate asymmetric left ventricular hypertrophy of the basal-septal segment. Left ventricular diastolic parameters are  consistent with Grade III diastolic dysfunction (restrictive). Elevated left atrial pressure. Right Ventricle: The right ventricular size is mildly enlarged. Mildly increased right ventricular wall thickness. Right ventricular systolic function is normal. There is severely elevated pulmonary artery systolic pressure. The tricuspid regurgitant velocity is 3.75 m/s, and with an assumed right atrial pressure of 8 mmHg, the estimated right ventricular systolic pressure is 64.2 mmHg. Left Atrium: Left atrial size was mildly dilated. Right Atrium: Right atrial size was mildly dilated. Pericardium: There is no evidence of pericardial effusion. Mitral Valve: The mitral valve is degenerative in appearance. There is  mild thickening of the mitral valve leaflet(s). There is mild calcification of the mitral valve leaflet(s). Moderate mitral valve regurgitation, with anteriorly-directed jet. No evidence of mitral valve stenosis. MV peak gradient, 6.0 mmHg. The mean mitral valve gradient is 2.0 mmHg. Tricuspid Valve: The tricuspid valve is normal in structure. Tricuspid valve regurgitation is mild. Aortic Valve: The aortic valve is tricuspid. There is mild thickening of the aortic valve. There is mild aortic valve annular calcification. Aortic valve regurgitation is trivial. Aortic valve sclerosis is present, with no evidence of aortic valve stenosis. Aortic valve mean gradient measures 4.0 mmHg. Aortic valve peak gradient measures 6.8 mmHg. Aortic valve area, by VTI measures 3.05 cm. Pulmonic Valve: The pulmonic valve was not well visualized. Pulmonic valve regurgitation is trivial. No evidence of pulmonic stenosis. Aorta: The aortic root and ascending aorta are structurally normal, with no evidence of dilitation. Pulmonary Artery: The pulmonary artery is not well seen. Venous: The inferior vena cava is normal in size with less than 50% respiratory variability, suggesting right atrial pressure of 8 mmHg. IAS/Shunts: Cannot  exclude a small PFO.  LEFT VENTRICLE PLAX 2D LVIDd:         5.40 cm      Diastology LVIDs:         4.60 cm      LV e' medial:    5.66 cm/s LV PW:         1.10 cm      LV E/e' medial:  20.8 LV IVS:        1.40 cm      LV e' lateral:   9.36 cm/s LVOT diam:     2.40 cm      LV E/e' lateral: 12.6 LV SV:         88 LV SV Index:   42 LVOT Area:     4.52 cm  LV Volumes (MOD) LV vol d, MOD A2C: 131.0 ml LV vol d, MOD A4C: 133.0 ml LV vol s, MOD A2C: 87.8 ml LV vol s, MOD A4C: 65.7 ml LV SV MOD A2C:     43.2 ml LV SV MOD A4C:     133.0 ml LV SV MOD BP:      53.1 ml RIGHT VENTRICLE             IVC RV Basal diam:  4.80 cm     IVC diam: 1.50 cm RV Mid diam:    3.00 cm RV S prime:     10.20 cm/s TAPSE (M-mode): 2.8 cm LEFT ATRIUM             Index        RIGHT ATRIUM           Index LA diam:        4.30 cm 2.02 cm/m   RA Area:     21.50 cm LA Vol (A2C):   86.5 ml 40.72 ml/m  RA Volume:   73.50 ml  34.60 ml/m LA Vol (A4C):   46.3 ml 21.80 ml/m LA Biplane Vol: 65.0 ml 30.60 ml/m  AORTIC VALVE                    PULMONIC VALVE AV Area (Vmax):    3.37 cm     PV Vmax:        0.82 m/s AV Area (Vmean):   2.96 cm     PV Vmean:       55.000 cm/s AV Area (VTI):  3.05 cm     PV VTI:         0.172 m AV Vmax:           130.00 cm/s  PV Peak grad:   2.7 mmHg AV Vmean:          89.800 cm/s  PV Mean grad:   1.0 mmHg AV VTI:            0.289 m      RVOT Peak grad: 1 mmHg AV Peak Grad:      6.8 mmHg AV Mean Grad:      4.0 mmHg LVOT Vmax:         96.90 cm/s LVOT Vmean:        58.700 cm/s LVOT VTI:          0.195 m LVOT/AV VTI ratio: 0.67  AORTA Ao Root diam: 3.80 cm Ao Asc diam:  3.60 cm MITRAL VALVE                  TRICUSPID VALVE MV Area (PHT): 4.31 cm       TR Peak grad:   56.2 mmHg MV Area VTI:   2.19 cm       TR Vmax:        375.00 cm/s MV Peak grad:  6.0 mmHg MV Mean grad:  2.0 mmHg       SHUNTS MV Vmax:       1.22 m/s       Systemic VTI:  0.20 m MV Vmean:      61.6 cm/s      Systemic Diam: 2.40 cm MV Decel Time: 176 msec        Pulmonic VTI:  0.120 m MR Peak grad:    135.3 mmHg MR Mean grad:    90.0 mmHg MR Vmax:         581.50 cm/s MR Vmean:        453.0 cm/s MR PISA:         3.53 cm MR PISA Eff ROA: 23 mm MR PISA Radius:  0.75 cm MV E velocity: 118.00 cm/s MV A velocity: 34.10 cm/s MV E/A ratio:  3.46 Lonni End MD Electronically signed by Lonni Hanson MD Signature Date/Time: 09/30/2024/6:52:59 AM    Final    CARDIAC CATHETERIZATION Result Date: 09/29/2024   Prox LAD lesion is 75% stenosed.   Mid LAD lesion is 80% stenosed.   Dist LAD lesion is 25% stenosed.   Dist Cx lesion is 70% stenosed.   2nd Diag lesion is 70% stenosed.   Non-stenotic Prox Cx to Mid Cx lesion was previously treated.   There is mild to moderate left ventricular systolic dysfunction.   LV end diastolic pressure is severely elevated.   The left ventricular ejection fraction is 35-45% by visual estimate. 1.  Patent left circumflex stent with no significant restenosis.  No clear culprit is identified for possible posterior STEMI.  The patient does have chronic disease involving the LAD and distal left circumflex but this does not appear to be different from before. 2.  Mildly to moderately reduced LV systolic function with an EF of 40% with severely elevated left ventricular end-diastolic pressure at 34 mmHg. 3.  Ascending aortogram showed no evidence of aortic dissection or significant aneurysm Recommendations: No clear culprit is identified for the patient's presentation.  The patient was chest pain-free at the end of the case. Replace potassium and diurese as needed.  I ordered an echocardiogram.  Continue  medical therapy for coronary artery disease.   DG Chest Portable 1 View Result Date: 09/29/2024 CLINICAL DATA:  Chest pain. EXAM: PORTABLE CHEST 1 VIEW COMPARISON:  June 02, 2021 FINDINGS: The heart size and mediastinal contours are within normal limits. Both lungs are clear. The visualized skeletal structures are unremarkable. IMPRESSION: No  active disease. Electronically Signed   By: Suzen Dials M.D.   On: 09/29/2024 11:14     ECHO as above  TELEMETRY (personally reviewed): sinus rhythm rate 70s  EKG (personally reviewed): NSR RBBB rate 58 bpm  DATA reviewed by me 09/30/2024: last 24h vitals tele labs imaging I/O, hospitalist progress note  Principal Problem:   Unstable angina (HCC) Active Problems:   Essential hypertension, benign   OSA (obstructive sleep apnea)   Gastroesophageal reflux disease   Coronary atherosclerosis of native coronary artery    ASSESSMENT AND PLAN: Alex [name] is a 64 y.o. male with a past medical history of CAD with STEMI in 2022, hypertension, hyperlipidemia, GERD, OSA who presented to the ED on 09/29/2024 for chest pain. Had continued severe pain similar to prior STEMI and thus was taken to cath lab emergently. Cardiology was consulted for further evaluation.   # Unstable angina # Coronary artery disease # Hypertension # Hyperlipidemia Patient presented yesterday with complaints of chest pain which came on suddenly, similar in nature to prior STEMI. EKG did not necessarily meet criteria but given his hx and symptoms, he was taken emergently for LHC. LHC 09/29/2024 revealed chronic, stable appearing LAD and LCx disease with patent prior stent. Planning for medical management. Echo this admission EF 45-50%, severe hypokinesis of the left ventricular, mid inferolateral wall, moderate MR, severely elevated PASP.  -Start imdur  30 mg daily.  -Start spironolactone  12.5 mg daily. Switch metoprolol  to succinate 25 mg twice daily. Continue losartan  50 mg daily.  -Continue atorvastatin  80 mg daily and aspirin  81 mg daily.   Ok for discharge today from a cardiac perspective. Will arrange for follow up in clinic with Dr. Florencio in 1-2 weeks.    This patient's case was discussed and created with Dr. Florencio and he is in agreement.  Signed:  Danita Bloch, PA-C  09/30/2024, 8:51 AM [facility]

## 2024-09-30 NOTE — Discharge Summary (Signed)
 Alex [name] FMW:[id number] DOB: [birth year] DOA: 09/29/2024  PCP: Jimmy Charlie FERNS, MD  Admit date: 09/29/2024 Discharge date: 09/30/2024  Time spent: 35 minutes  Recommendations for Outpatient Follow-up:  Cardiology f/u 1 week Pcp f/u as well Check BMP in one week (spironolactone  added)  Referred to pulmonary Needs to resume cpap    Discharge Diagnoses:  Principal Problem:   Unstable angina (HCC) Active Problems:   Essential hypertension, benign   OSA (obstructive sleep apnea)   Gastroesophageal reflux disease   Coronary atherosclerosis of native coronary artery   Discharge Condition: improved  Diet recommendation: heart healthy  Filed Weights   09/29/24 1034 09/29/24 1150  Weight: 94.5 kg 92.3 kg    History of present illness:   Alex [name] is a pleasant 64 y.o. male with medical history significant for CAD s/p STEMI in 2022 with stent placement to the left circumflex at that time, HTN, HFrEF, HLD, GERD who presented to ED at [facility] with sudden onset of acute substernal left-sided chest pain and tightness very similar to his prior myocardial infarction started this morning when he woke up.  Patient stated chest pain as 10/10 intensity mostly in the epigastric area, sharp, pressure-like, nonradiating.  Patient denied any fever, chills, cough, palpitations.  Denied any chest trauma.   Hospital Course:   Hx CAD, hx STEMI with PCI to LCx in 2022, presenting with chest pain. Normal trops but symtpoms concerning for unstable angina so patient was taken for urgent LHC which revealed patent stent and stable milti-vessel CAD, no culprit identified resolved. No intervention. TTE with EF 45-50, RWMAs, grade 3 dd, severe pHTN. Symptoms now resolved. Cardiology has cleared for discharge with med changes as below, planning on f/u in 1 week. Patient advised hold cialis . For pHTN may be secondary to untreated osa (patient reports hasn't been using cpap for several months). With CHF,  left-sided heart failure likely also playing a significant role. No hypoxia and dimer normal, think less likely PE. Referred to pulmonary.  Procedures: LHC   Consultations: cardiology  Discharge Exam: Vitals:   09/30/24 0600 09/30/24 0628  BP: (!) 169/111 (!) 164/109  Pulse: 63 61  Resp: 13 14  Temp:    SpO2: 95% 94%    General: NAD Cardiovascular: RRR, soft systolic murmur Respiratory: CTAB  Discharge Instructions   Discharge Instructions     AMB referral to Phase II Cardiac Rehabilitation   Complete by: As directed    Diagnosis: NSTEMI   After initial evaluation and assessments completed: Virtual Based Care may be provided alone or in conjunction with Phase 2 Cardiac Rehab based on patient barriers.: Yes   Intensive Cardiac Rehabilitation (ICR) [facility] location only OR Traditional Cardiac Rehabilitation (TCR) *If criteria for ICR are not met will enroll in TCR [facility] only): Yes   Increase activity slowly   Complete by: As directed       Allergies as of 09/30/2024   No Known Allergies      Medication List     STOP taking these medications    metoprolol  tartrate 25 MG tablet Commonly known as: LOPRESSOR    tadalafil  10 MG tablet Commonly known as: CIALIS        TAKE these medications    aspirin  81 MG chewable tablet Chew 1 tablet (81 mg total) by mouth daily.   atorvastatin  80 MG tablet Commonly known as: LIPITOR  Take 1 tablet (80 mg total) by mouth daily.   DULoxetine  60 MG capsule Commonly known as: CYMBALTA  TAKE 1  CAPSULE BY MOUTH ONCE DAILY   famotidine  40 MG tablet Commonly known as: PEPCID  Take 1 tablet (40 mg total) by mouth 2 (two) times daily.   isosorbide  mononitrate 30 MG 24 hr tablet Commonly known as: IMDUR  Take 1 tablet (30 mg total) by mouth daily. Start taking on: October 01, 2024   losartan  50 MG tablet Commonly known as: COZAAR  Take 1 tablet (50 mg total) by mouth daily.   metoprolol  succinate 25 MG 24 hr tablet Commonly known  as: TOPROL -XL Take 1 tablet (25 mg total) by mouth 2 (two) times daily. Take with or immediately following a meal.   spironolactone  25 MG tablet Commonly known as: ALDACTONE  Take 0.5 tablets (12.5 mg total) by mouth daily.       Allergies[1]  Follow-up Information     Florencio Cara BIRCH, MD. Go in 1 week(s).   Specialties: Cardiology, Internal Medicine Contact information: [address] [place] [address] [phone]         Jimmy Charlie FERNS, MD Follow up.   Specialties: Internal Medicine, Pediatrics Contact information: [address] [place] [address] [phone]                  The results of significant diagnostics from this hospitalization (including imaging, microbiology, ancillary and laboratory) are listed below for reference.    Significant Diagnostic Studies: ECHOCARDIOGRAM COMPLETE Result Date: 09/30/2024    ECHOCARDIOGRAM REPORT   Patient Name:   Alex [name] Date of Exam: 09/29/2024 Medical Rec #:  [id number]        Height:       71.0 in Accession #:    [id number]       Weight:       203.5 lb Date of Birth:  November 28, 1960       BSA:          2.124 m Patient Age:    63 years         BP:           162/116 mmHg Patient Gender: M                HR:           62 bpm. Exam Location:  [facility] Procedure: 2D Echo, Cardiac Doppler and Color Doppler (Both Spectral and Color            Flow Doppler were utilized during procedure). Indications:     Chest Pain R07.9  History:         Patient has prior history of Echocardiogram examinations, most                  recent 06/03/2021. CAD, Abnormal ECG; Signs/Symptoms:Chest Pain.  Sonographer:     Ashley McNeely-Sloane Referring Phys:  [id number] FLYJFFJI A ARIDA Diagnosing Phys: Lonni Hanson MD IMPRESSIONS  1. Left ventricular ejection fraction, by estimation, is 45 to 50%. The left ventricle has mildly decreased function. The left ventricle demonstrates regional wall motion abnormalities (see scoring  diagram/findings for description). There is moderate asymmetric left ventricular hypertrophy of the basal-septal segment. Left ventricular diastolic parameters are consistent with Grade III diastolic dysfunction (restrictive). Elevated left atrial pressure. There is severe hypokinesis of the left ventricular, mid inferolateral wall.  2. Right ventricular systolic function is normal. The right ventricular size is mildly enlarged. Mildly increased right ventricular wall thickness. There is severely elevated pulmonary artery systolic pressure. The estimated right ventricular systolic pressure is 64.2 mmHg.  3. Left  atrial size was mildly dilated.  4. Right atrial size was mildly dilated.  5. The mitral valve is degenerative. Moderate mitral valve regurgitation. No evidence of mitral stenosis.  6. The aortic valve is tricuspid. There is mild thickening of the aortic valve. Aortic valve regurgitation is trivial. Aortic valve sclerosis is present, with no evidence of aortic valve stenosis.  7. The inferior vena cava is normal in size with <50% respiratory variability, suggesting right atrial pressure of 8 mmHg.  8. Cannot exclude a small PFO. FINDINGS  Left Ventricle: Left ventricular ejection fraction, by estimation, is 45 to 50%. The left ventricle has mildly decreased function. The left ventricle demonstrates regional wall motion abnormalities. Severe hypokinesis of the left ventricular, mid inferolateral wall. The left ventricular internal cavity size was normal in size. There is moderate asymmetric left ventricular hypertrophy of the basal-septal segment. Left ventricular diastolic parameters are consistent with Grade III diastolic dysfunction (restrictive). Elevated left atrial pressure. Right Ventricle: The right ventricular size is mildly enlarged. Mildly increased right ventricular wall thickness. Right ventricular systolic function is normal. There is severely elevated pulmonary artery systolic pressure. The  tricuspid regurgitant velocity is 3.75 m/s, and with an assumed right atrial pressure of 8 mmHg, the estimated right ventricular systolic pressure is 64.2 mmHg. Left Atrium: Left atrial size was mildly dilated. Right Atrium: Right atrial size was mildly dilated. Pericardium: There is no evidence of pericardial effusion. Mitral Valve: The mitral valve is degenerative in appearance. There is mild thickening of the mitral valve leaflet(s). There is mild calcification of the mitral valve leaflet(s). Moderate mitral valve regurgitation, with anteriorly-directed jet. No evidence of mitral valve stenosis. MV peak gradient, 6.0 mmHg. The mean mitral valve gradient is 2.0 mmHg. Tricuspid Valve: The tricuspid valve is normal in structure. Tricuspid valve regurgitation is mild. Aortic Valve: The aortic valve is tricuspid. There is mild thickening of the aortic valve. There is mild aortic valve annular calcification. Aortic valve regurgitation is trivial. Aortic valve sclerosis is present, with no evidence of aortic valve stenosis. Aortic valve mean gradient measures 4.0 mmHg. Aortic valve peak gradient measures 6.8 mmHg. Aortic valve area, by VTI measures 3.05 cm. Pulmonic Valve: The pulmonic valve was not well visualized. Pulmonic valve regurgitation is trivial. No evidence of pulmonic stenosis. Aorta: The aortic root and ascending aorta are structurally normal, with no evidence of dilitation. Pulmonary Artery: The pulmonary artery is not well seen. Venous: The inferior vena cava is normal in size with less than 50% respiratory variability, suggesting right atrial pressure of 8 mmHg. IAS/Shunts: Cannot exclude a small PFO.  LEFT VENTRICLE PLAX 2D LVIDd:         5.40 cm      Diastology LVIDs:         4.60 cm      LV e' medial:    5.66 cm/s LV PW:         1.10 cm      LV E/e' medial:  20.8 LV IVS:        1.40 cm      LV e' lateral:   9.36 cm/s LVOT diam:     2.40 cm      LV E/e' lateral: 12.6 LV SV:         88 LV SV Index:    42 LVOT Area:     4.52 cm  LV Volumes (MOD) LV vol d, MOD A2C: 131.0 ml LV vol d, MOD A4C: 133.0 ml LV vol s, MOD A2C:  87.8 ml LV vol s, MOD A4C: 65.7 ml LV SV MOD A2C:     43.2 ml LV SV MOD A4C:     133.0 ml LV SV MOD BP:      53.1 ml RIGHT VENTRICLE             IVC RV Basal diam:  4.80 cm     IVC diam: 1.50 cm RV Mid diam:    3.00 cm RV S prime:     10.20 cm/s TAPSE (M-mode): 2.8 cm LEFT ATRIUM             Index        RIGHT ATRIUM           Index LA diam:        4.30 cm 2.02 cm/m   RA Area:     21.50 cm LA Vol (A2C):   86.5 ml 40.72 ml/m  RA Volume:   73.50 ml  34.60 ml/m LA Vol (A4C):   46.3 ml 21.80 ml/m LA Biplane Vol: 65.0 ml 30.60 ml/m  AORTIC VALVE                    PULMONIC VALVE AV Area (Vmax):    3.37 cm     PV Vmax:        0.82 m/s AV Area (Vmean):   2.96 cm     PV Vmean:       55.000 cm/s AV Area (VTI):     3.05 cm     PV VTI:         0.172 m AV Vmax:           130.00 cm/s  PV Peak grad:   2.7 mmHg AV Vmean:          89.800 cm/s  PV Mean grad:   1.0 mmHg AV VTI:            0.289 m      RVOT Peak grad: 1 mmHg AV Peak Grad:      6.8 mmHg AV Mean Grad:      4.0 mmHg LVOT Vmax:         96.90 cm/s LVOT Vmean:        58.700 cm/s LVOT VTI:          0.195 m LVOT/AV VTI ratio: 0.67  AORTA Ao Root diam: 3.80 cm Ao Asc diam:  3.60 cm MITRAL VALVE                  TRICUSPID VALVE MV Area (PHT): 4.31 cm       TR Peak grad:   56.2 mmHg MV Area VTI:   2.19 cm       TR Vmax:        375.00 cm/s MV Peak grad:  6.0 mmHg MV Mean grad:  2.0 mmHg       SHUNTS MV Vmax:       1.22 m/s       Systemic VTI:  0.20 m MV Vmean:      61.6 cm/s      Systemic Diam: 2.40 cm MV Decel Time: 176 msec       Pulmonic VTI:  0.120 m MR Peak grad:    135.3 mmHg MR Mean grad:    90.0 mmHg MR Vmax:         581.50 cm/s MR Vmean:        453.0 cm/s MR PISA:  3.53 cm MR PISA Eff ROA: 23 mm MR PISA Radius:  0.75 cm MV E velocity: 118.00 cm/s MV A velocity: 34.10 cm/s MV E/A ratio:  3.46 Lonni Hanson MD Electronically signed  by Lonni Hanson MD Signature Date/Time: 09/30/2024/6:52:59 AM    Final    CARDIAC CATHETERIZATION Result Date: 09/29/2024   Prox LAD lesion is 75% stenosed.   Mid LAD lesion is 80% stenosed.   Dist LAD lesion is 25% stenosed.   Dist Cx lesion is 70% stenosed.   2nd Diag lesion is 70% stenosed.   Non-stenotic Prox Cx to Mid Cx lesion was previously treated.   There is mild to moderate left ventricular systolic dysfunction.   LV end diastolic pressure is severely elevated.   The left ventricular ejection fraction is 35-45% by visual estimate. 1.  Patent left circumflex stent with no significant restenosis.  No clear culprit is identified for possible posterior STEMI.  The patient does have chronic disease involving the LAD and distal left circumflex but this does not appear to be different from before. 2.  Mildly to moderately reduced LV systolic function with an EF of 40% with severely elevated left ventricular end-diastolic pressure at 34 mmHg. 3.  Ascending aortogram showed no evidence of aortic dissection or significant aneurysm Recommendations: No clear culprit is identified for the patient's presentation.  The patient was chest pain-free at the end of the case. Replace potassium and diurese as needed.  I ordered an echocardiogram.  Continue medical therapy for coronary artery disease.   DG Chest Portable 1 View Result Date: 09/29/2024 CLINICAL DATA:  Chest pain. EXAM: PORTABLE CHEST 1 VIEW COMPARISON:  June 02, 2021 FINDINGS: The heart size and mediastinal contours are within normal limits. Both lungs are clear. The visualized skeletal structures are unremarkable. IMPRESSION: No active disease. Electronically Signed   By: Suzen Dials M.D.   On: 09/29/2024 11:14    Microbiology: Recent Results (from the past 240 hours)  MRSA Next Gen by PCR, Nasal     Status: None   Collection Time: 09/29/24 11:54 AM   Specimen: Nasal Mucosa; Nasal Swab  Result Value Ref Range Status   MRSA by PCR Next Gen  NOT DETECTED NOT DETECTED Final    Comment: (NOTE) The GeneXpert MRSA Assay (FDA approved for NASAL specimens only), is one component of a comprehensive MRSA colonization surveillance program. It is not intended to diagnose MRSA infection nor to guide or monitor treatment for MRSA infections. Test performance is not FDA approved in patients less than 34 years old. Performed at [facility], [address] [place], [place] [address]      Labs: Basic Metabolic Panel: Recent Labs  Lab 09/29/24 0955 09/29/24 1210 09/30/24 0330  NA 140 139 138  K 3.1* 3.4* 3.3*  CL 102 104 103  CO2 17* 24 23  GLUCOSE 164* 120* 117*  BUN 21 19 15   CREATININE 1.21 1.03 1.04  CALCIUM  9.9 9.3 9.3   Liver Function Tests: Recent Labs  Lab 09/29/24 1210 09/30/24 0330  AST 22 19  ALT 26 21  ALKPHOS 131* 119  BILITOT 1.9* 2.1*  PROT 7.0 6.7  ALBUMIN 4.5 4.3   No results for input(s): LIPASE, AMYLASE in the last 168 hours. No results for input(s): AMMONIA in the last 168 hours. CBC: Recent Labs  Lab 09/29/24 0955 09/29/24 1210 09/30/24 0330  WBC 17.6* 11.0* 10.8*  HGB 14.8 14.1 14.0  HCT 43.6 42.2 42.2  MCV 84.8 84.4 86.7  PLT 230 175 197   Cardiac Enzymes: No results for input(s): CKTOTAL, CKMB, CKMBINDEX, TROPONINI in the last 168 hours. BNP: BNP (last 3 results) No results for input(s): BNP in the last 8760 hours.  ProBNP (last 3 results) No results for input(s): PROBNP in the last 8760 hours.  CBG: Recent Labs  Lab 09/29/24 1147  GLUCAP 143*       Signed:  Devaughn KATHEE Ban MD.  [facility] 09/30/2024, 12:57 PM     [1] No Known Allergies

## 2024-10-01 ENCOUNTER — Ambulatory Visit: Payer: Self-pay | Admitting: Family

## 2024-10-01 LAB — LIPOPROTEIN A (LPA): Lipoprotein (a): 15.4 nmol/L

## 2024-10-02 NOTE — Telephone Encounter (Signed)
 Pt needs TOC with provider in the practice. Thank you.

## 2024-10-08 ENCOUNTER — Encounter: Payer: Self-pay | Admitting: Student in an Organized Health Care Education/Training Program

## 2024-10-08 ENCOUNTER — Ambulatory Visit (INDEPENDENT_AMBULATORY_CARE_PROVIDER_SITE_OTHER): Admitting: Student in an Organized Health Care Education/Training Program

## 2024-10-08 VITALS — BP 124/82 | HR 73 | Temp 98.5°F | Ht 71.0 in | Wt 208.0 lb

## 2024-10-08 DIAGNOSIS — I2722 Pulmonary hypertension due to left heart disease: Secondary | ICD-10-CM | POA: Diagnosis not present

## 2024-10-08 DIAGNOSIS — G4733 Obstructive sleep apnea (adult) (pediatric): Secondary | ICD-10-CM

## 2024-10-08 DIAGNOSIS — Z720 Tobacco use: Secondary | ICD-10-CM | POA: Diagnosis not present

## 2024-10-08 NOTE — Patient Instructions (Signed)
" °  VISIT SUMMARY: During your visit, we evaluated your pulmonary hypertension, which is likely due to your existing heart condition. We also discussed your history of sleep apnea and its potential impact on your health.  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 Your recent tests showed elevated pressures in your heart and lungs. You will need to see a heart failure specialist for further evaluation and management. Continue taking your diuretic medication as prescribed.  -OBSTRUCTIVE SLEEP APNEA: Obstructive sleep apnea is a condition where your breathing stops and starts during sleep. You have not been using your CPAP machine, which may be worsening your symptoms. We have ordered a sleep study to reassess the severity of your condition and a pulmonary function test to check your lung function.  INSTRUCTIONS: Please follow up with the heart failure specialist as referred. Additionally, complete the sleep study and pulmonary function test as ordered to reassess your sleep apnea and lung function.                      Contains text generated by Abridge.                                 Contains text generated by Abridge.   "

## 2024-10-08 NOTE — Progress Notes (Signed)
 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 He had a LHC with LVEDP measured at 34 mmHg. This highly suggests group 2 pulmonary hypertension secondary to his ischemic cardiomyopathy. Other considerations include contribution from his known OSA, and possible underlying lung disease given history of smoking.  Will obtain PFT's to assess for any obstructive lung disease, as well as a split night sleep study to re-evaluate sleep apnea. That said, I do think his primary problem is cardiac, and he would benefit from an evaluation with advanced heart failure. I would recommend he undergo a right heart catheterization to get true filling pressures and assess for pre vs post capillary pulmonary hypertension.  - Pulmonary Function Test; Future - Recommend RHC with cardiology  #Obstructive sleep apnea    Diagnosed over ten years ago, he has been non-compliant with CPAP therapy for the past two years, experiencing nocturnal dyspnea and excessive sweating. This may contribute to elevated pulmonary pressures. A sleep study is ordered to reassess the severity of obstructive sleep apnea and the need for CPAP therapy. A pulmonary function test is also ordered to evaluate lung function.  - Split night study; Future  Return in about 3 months (around 01/06/2025).  Belva November, MD [facility]  I spent 45 minutes caring for this patient today, including preparing to see the patient, obtaining a medical history , reviewing a separately obtained history, performing a medically appropriate examination and/or evaluation, counseling and educating the patient/family/caregiver, ordering medications, tests, or procedures, documenting clinical information in the electronic health record, and independently interpreting results (not  separately reported/billed) and communicating results to the patient/family/caregiver  End of visit medications:  No orders of the defined types were placed in this encounter.   Current Medications[1]   Subjective:   PATIENT ID: Alex [name], Alex [name]  Chief Complaint  Patient presents with   Consult    Occasional SOB. Wheezing. Occasional cough.     HPI  Discussed the use of AI scribe software for clinical note transcription with the patient, who gave verbal consent to proceed.  History of Present Illness  Alex [name] is a 64 year old male with coronary artery disease and ischemic cardiomyopathy who presents for evaluation of pulmonary hypertension.  He has a history of coronary artery disease and ischemic cardiomyopathy, with a STEMI in September 2022 that required stenting. In early January 2026, he was hospitalized due to chest pain and discomfort. During this hospitalization, a left heart catheterization showed a patent left circumflex stent but revealed stable multivessel coronary artery disease. His left ventricular end-diastolic pressure was severely elevated at 34 mmHg. An echocardiogram performed during this hospitalization showed severely elevated pulmonary artery systolic pressure (PASP) estimated at 64 mmHg. During the recent episode of chest pain, the pain was intense, occurring every five to six seconds, and lasted for a second or two before subsiding. He describes the pain as 'intense' but notes feeling better after treatment in the hospital.  He has a history of sleep apnea diagnosed over ten years ago. Initially compliant with CPAP therapy, he stopped using it approximately two years ago. He experiences episodes of waking up feeling like he is suffocating and sweating profusely, which he attributes to not using the CPAP.  He smokes marijuana daily, consuming about half an ounce every five to six  weeks. He denies smoking  cigarettes, having last smoked them at age 32. He has been using marijuana since age 23, with a break during his early adulthood, and resumed due to work-related stress.    Ancillary information including prior medications, full medical/surgical/family/social histories, and PFTs (when available) are listed below and have been reviewed.    Review of Systems  Constitutional:  Negative for chills, fever and weight loss.  Respiratory:  Positive for shortness of breath. Negative for cough, hemoptysis, sputum production and wheezing.   Cardiovascular:  Negative for chest pain.     Objective:   Vitals:   10/08/24 1357  BP: 124/82  Pulse: 73  Temp: 98.5 F (36.9 C)  SpO2: 95%  Weight: 208 lb (94.3 kg)  Height: 5' 11 (1.803 m)   95% on RA BMI Readings from Last 3 Encounters:  10/08/24 29.01 kg/m  09/29/24 28.38 kg/m  04/01/24 27.14 kg/m   Wt Readings from Last 3 Encounters:  10/08/24 208 lb (94.3 kg)  09/29/24 203 lb 7.8 oz (92.3 kg)  04/01/24 205 lb 11.2 oz (93.3 kg)     Physical Exam Constitutional:      Appearance: Normal appearance.  Cardiovascular:     Rate and Rhythm: Normal rate and regular rhythm.     Pulses: Normal pulses.     Heart sounds: Normal heart sounds.  Pulmonary:     Effort: Pulmonary effort is normal.     Breath sounds: Normal breath sounds.  Neurological:     General: No focal deficit present.     Mental Status: He is alert and oriented to person, place, and time. Mental status is at baseline.     Ancillary Information    Past Medical History:  Diagnosis Date   HTN (hypertension)    OSA (obstructive sleep apnea)    PSG 09/23/10 AHI 22   RBBB (right bundle branch block)      Family History  Problem Relation Age of Onset   Stroke Father 55       doing okay   Skin cancer Mother    Stroke Paternal Grandfather        and PGGF   Hypertension Other        dad's side    Diabetes Neg Hx        DM    Coronary artery disease  Neg Hx    Prostate cancer Neg Hx        no colon cancer    Colon cancer Neg Hx      Past Surgical History:  Procedure Laterality Date   APPENDECTOMY  1988   CORONARY/GRAFT ACUTE MI REVASCULARIZATION N/A 06/02/2021   Procedure: Coronary/Graft Acute MI Revascularization;  Surgeon: Florencio Cara BIRCH, MD;  Location: [facility];  Service: Cardiovascular;  Laterality: N/A;   CORONARY/GRAFT ACUTE MI REVASCULARIZATION N/A 09/29/2024   Procedure: Coronary/Graft Acute MI Revascularization;  Surgeon: Darron Deatrice LABOR, MD;  Location: [facility];  Service: Cardiovascular;  Laterality: N/A;   fracture L arm     LEFT HEART CATH AND CORONARY ANGIOGRAPHY N/A 06/02/2021   Procedure: LEFT HEART CATH AND CORONARY ANGIOGRAPHY;  Surgeon: Florencio Cara BIRCH, MD;  Location: [facility];  Service: Cardiovascular;  Laterality: N/A;   LEFT HEART CATH AND CORONARY ANGIOGRAPHY N/A 09/29/2024   Procedure: LEFT HEART CATH AND CORONARY ANGIOGRAPHY;  Surgeon: Darron Deatrice LABOR, MD;  Location: [facility];  Service: Cardiovascular;  Laterality: N/A;   TONSILLECTOMY  VASECTOMY  1997     Social History   Socioeconomic History   Marital status: Married    Spouse name: Not on file   Number of children: 2   Years of education: Not on file   Highest education level: Not on file  Occupational History   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   [employer].        Social Drivers of Health   Tobacco Use: Medium Risk  (10/08/2024)   Patient History    Smoking Tobacco Use: Never    Smokeless Tobacco Use: Former    Passive Exposure: Past  Programmer, Applications: Not on file  Food Insecurity: No Food Insecurity (09/29/2024)   Epic    Worried About Programme Researcher, Broadcasting/film/video in the Last Year: Never true    Ran Out of Food in the Last Year: Never true  Transportation Needs: No Transportation Needs (09/29/2024)   Epic    Lack of Transportation (Medical): No    Lack of Transportation (Non-Medical): No  Physical Activity: Not on file  Stress: Not on file  Social Connections: Not on file  Intimate Partner Violence: Not At Risk (09/29/2024)   Epic    Fear of Current or Ex-Partner: No    Emotionally Abused: No    Physically Abused: No    Sexually Abused: No  Depression (PHQ2-9): Low Risk (04/23/2023)   Depression (PHQ2-9)    PHQ-2 Score: 1  Alcohol Screen: Not on file  Housing: Low Risk (09/29/2024)   Epic    Unable to Pay for Housing in the Last Year: No    Number of Times Moved in the Last Year: 0    Homeless in the Last Year: No  Utilities: Not At Risk (09/29/2024)   Epic    Threatened with loss of utilities: No  Health Literacy: Not on file     Allergies[2]   CBC    Component Value Date/Time   WBC 10.8 (H) 09/30/2024 0330   RBC 4.87 09/30/2024 0330   HGB 14.0 09/30/2024 0330   HCT 42.2 09/30/2024 0330   PLT 197 09/30/2024 0330   MCV 86.7 09/30/2024 0330   MCH 28.7 09/30/2024 0330   MCHC 33.2 09/30/2024 0330   RDW 12.4 09/30/2024 0330   LYMPHSABS 1.5 06/04/2021 0508   MONOABS 1.1 (H) 06/04/2021 0508   EOSABS 0.0 06/04/2021 0508   BASOSABS 0.0 06/04/2021 0508    Pulmonary Functions Testing Results:     No data to display          Outpatient Medications Prior to Visit  Medication Sig Dispense Refill   aspirin  81 MG chewable tablet Chew 1 tablet (81 mg total) by mouth daily. 30 tablet 0   atorvastatin  (LIPITOR ) 80 MG tablet Take 1 tablet (80 mg total) by mouth daily. 30 tablet 0    DULoxetine  (CYMBALTA ) 60 MG capsule TAKE 1  CAPSULE BY MOUTH ONCE DAILY 90 capsule 3   famotidine  (PEPCID ) 40 MG tablet TAKE 1 TABLET BY MOUTH TWICE DAILY 180 tablet 0   isosorbide  mononitrate (IMDUR ) 30 MG 24 hr tablet Take 1 tablet (30 mg total) by mouth daily. 90 tablet 1   losartan  (COZAAR ) 50 MG tablet Take 1 tablet (50 mg total) by mouth daily. 30 tablet 0   metoprolol  succinate (TOPROL -XL) 25 MG 24 hr tablet Take 1 tablet (25 mg total) by mouth 2 (two) times daily. Take with or immediately following a meal. 180 tablet 1   spironolactone  (ALDACTONE ) 25 MG tablet Take 0.5 tablets (12.5 mg total) by mouth daily. 90 tablet 1   No facility-administered medications prior to visit.      [1]  Current Outpatient Medications:    aspirin  81 MG chewable tablet, Chew 1 tablet (81 mg total) by mouth daily., Disp: 30 tablet, Rfl: 0   atorvastatin  (LIPITOR ) 80 MG tablet, Take 1 tablet (80 mg total) by mouth daily., Disp: 30 tablet, Rfl: 0   DULoxetine  (CYMBALTA ) 60 MG capsule, TAKE 1 CAPSULE BY MOUTH ONCE DAILY, Disp: 90 capsule, Rfl: 3   famotidine  (PEPCID ) 40 MG tablet, TAKE 1 TABLET BY MOUTH TWICE DAILY, Disp: 180 tablet, Rfl: 0   isosorbide  mononitrate (IMDUR ) 30 MG 24 hr tablet, Take 1 tablet (30 mg total) by mouth daily., Disp: 90 tablet, Rfl: 1   losartan  (COZAAR ) 50 MG tablet, Take 1 tablet (50 mg total) by mouth daily., Disp: 30 tablet, Rfl: 0   metoprolol  succinate (TOPROL -XL) 25 MG 24 hr tablet, Take 1 tablet (25 mg total) by mouth 2 (two) times daily. Take with or immediately following a meal., Disp: 180 tablet, Rfl: 1   spironolactone  (ALDACTONE ) 25 MG tablet, Take 0.5 tablets (12.5 mg total) by mouth daily., Disp: 90 tablet, Rfl: 1 [2] No Known Allergies  "

## 2024-10-09 NOTE — Telephone Encounter (Signed)
 I called and LVM.

## 2024-10-17 ENCOUNTER — Telehealth: Payer: Self-pay | Admitting: Student in an Organized Health Care Education/Training Program

## 2024-10-17 NOTE — Telephone Encounter (Signed)
 Dr. Isadora saw this patient on 10/08/24 and placed an order for in lab sleep study. The [facility] to [facility] we received a note from [facility] the [employer] required the patient to have a home sleep study before they approve in lab sleep study. I changed the order form from split night to home sleep study and refaxed back to [facility]

## 2024-10-28 ENCOUNTER — Ambulatory Visit

## 2024-10-28 DIAGNOSIS — I2722 Pulmonary hypertension due to left heart disease: Secondary | ICD-10-CM | POA: Diagnosis not present

## 2024-10-28 DIAGNOSIS — G4733 Obstructive sleep apnea (adult) (pediatric): Secondary | ICD-10-CM

## 2024-10-28 LAB — PULMONARY FUNCTION TEST
DL/VA % pred: 89 %
DL/VA: 3.72 ml/min/mmHg/L
DLCO cor % pred: 105 %
DLCO cor: 29.35 ml/min/mmHg
DLCO unc % pred: 103 %
DLCO unc: 28.84 ml/min/mmHg
FEF 25-75 Post: 3.43 L/s
FEF 25-75 Pre: 2.97 L/s
FEF2575-%Change-Post: 15 %
FEF2575-%Pred-Post: 115 %
FEF2575-%Pred-Pre: 99 %
FEV1-%Change-Post: 3 %
FEV1-%Pred-Post: 112 %
FEV1-%Pred-Pre: 107 %
FEV1-Post: 4.12 L
FEV1-Pre: 3.96 L
FEV1FVC-%Change-Post: 5 %
FEV1FVC-%Pred-Pre: 98 %
FEV6-%Change-Post: -1 %
FEV6-%Pred-Post: 113 %
FEV6-%Pred-Pre: 114 %
FEV6-Post: 5.25 L
FEV6-Pre: 5.34 L
FEV6FVC-%Change-Post: 0 %
FEV6FVC-%Pred-Post: 104 %
FEV6FVC-%Pred-Pre: 104 %
FVC-%Change-Post: 0 %
FVC-%Pred-Post: 108 %
FVC-%Pred-Pre: 110 %
FVC-Post: 5.31 L
FVC-Pre: 5.36 L
Post FEV1/FVC ratio: 78 %
Post FEV6/FVC ratio: 99 %
Pre FEV1/FVC ratio: 74 %
Pre FEV6/FVC Ratio: 100 %
RV % pred: 112 %
RV: 2.63 L
TLC % pred: 113 %
TLC: 8.17 L

## 2024-10-28 NOTE — Progress Notes (Signed)
 Full PFT completed today ? ?

## 2024-10-28 NOTE — Patient Instructions (Signed)
 Full PFT completed today ? ?

## 2025-01-21 ENCOUNTER — Ambulatory Visit: Admitting: Student in an Organized Health Care Education/Training Program

## 2025-04-21 ENCOUNTER — Other Ambulatory Visit

## 2025-04-28 ENCOUNTER — Ambulatory Visit: Admitting: Urology
